# Patient Record
Sex: Female | Born: 1950 | Race: White | Hispanic: No | Marital: Married | Smoking: Current every day smoker
Health system: Southern US, Community
[De-identification: ages and names within clinical notes are randomized; demographics above are authoritative.]

## PROBLEM LIST (undated history)

## (undated) DIAGNOSIS — F419 Anxiety disorder, unspecified: Secondary | ICD-10-CM

## (undated) DIAGNOSIS — I6529 Occlusion and stenosis of unspecified carotid artery: Secondary | ICD-10-CM

## (undated) DIAGNOSIS — M51369 Other intervertebral disc degeneration, lumbar region without mention of lumbar back pain or lower extremity pain: Secondary | ICD-10-CM

## (undated) DIAGNOSIS — M5136 Other intervertebral disc degeneration, lumbar region: Secondary | ICD-10-CM

## (undated) DIAGNOSIS — I219 Acute myocardial infarction, unspecified: Secondary | ICD-10-CM

## (undated) DIAGNOSIS — G459 Transient cerebral ischemic attack, unspecified: Secondary | ICD-10-CM

## (undated) DIAGNOSIS — I2510 Atherosclerotic heart disease of native coronary artery without angina pectoris: Secondary | ICD-10-CM

## (undated) DIAGNOSIS — E7800 Pure hypercholesterolemia, unspecified: Secondary | ICD-10-CM

## (undated) DIAGNOSIS — G8929 Other chronic pain: Secondary | ICD-10-CM

## (undated) DIAGNOSIS — M1990 Unspecified osteoarthritis, unspecified site: Secondary | ICD-10-CM

## (undated) DIAGNOSIS — E8881 Metabolic syndrome: Secondary | ICD-10-CM

## (undated) DIAGNOSIS — I739 Peripheral vascular disease, unspecified: Secondary | ICD-10-CM

## (undated) DIAGNOSIS — I10 Essential (primary) hypertension: Secondary | ICD-10-CM

## (undated) DIAGNOSIS — G2581 Restless legs syndrome: Secondary | ICD-10-CM

## (undated) DIAGNOSIS — F482 Pseudobulbar affect: Secondary | ICD-10-CM

## (undated) DIAGNOSIS — N390 Urinary tract infection, site not specified: Secondary | ICD-10-CM

## (undated) DIAGNOSIS — I639 Cerebral infarction, unspecified: Secondary | ICD-10-CM

## (undated) HISTORY — PX: CARDIAC SURGERY: SHX584

## (undated) HISTORY — DX: Anxiety disorder, unspecified: F41.9

## (undated) HISTORY — DX: Restless legs syndrome: G25.81

## (undated) HISTORY — DX: Urinary tract infection, site not specified: N39.0

## (undated) HISTORY — DX: Occlusion and stenosis of unspecified carotid artery: I65.29

## (undated) HISTORY — DX: Other intervertebral disc degeneration, lumbar region: M51.36

## (undated) HISTORY — DX: Metabolic syndrome: E88.81

## (undated) HISTORY — DX: Pseudobulbar affect: F48.2

## (undated) HISTORY — DX: Other intervertebral disc degeneration, lumbar region without mention of lumbar back pain or lower extremity pain: M51.369

## (undated) HISTORY — DX: Peripheral vascular disease, unspecified: I73.9

## (undated) HISTORY — PX: EYE SURGERY: SHX253

## (undated) HISTORY — DX: Other chronic pain: G89.29

## (undated) HISTORY — PX: CORONARY ARTERY BYPASS GRAFT: SHX141

## (undated) HISTORY — DX: Metabolic syndrome: E88.810

---

## 1995-01-27 HISTORY — PX: PTCA: SHX146

## 2012-10-16 ENCOUNTER — Inpatient Hospital Stay (HOSPITAL_COMMUNITY): Payer: BC Managed Care – PPO

## 2012-10-16 ENCOUNTER — Inpatient Hospital Stay (HOSPITAL_COMMUNITY)
Admission: EM | Admit: 2012-10-16 | Discharge: 2012-10-20 | DRG: 014 | Disposition: A | Discharge status: Rehabilitation Facility | Payer: BC Managed Care – PPO | Attending: Neurology | Admitting: Neurology

## 2012-10-16 ENCOUNTER — Emergency Department (HOSPITAL_COMMUNITY): Payer: BC Managed Care – PPO

## 2012-10-16 ENCOUNTER — Encounter (HOSPITAL_COMMUNITY): Payer: Self-pay

## 2012-10-16 DIAGNOSIS — I10 Essential (primary) hypertension: Secondary | ICD-10-CM | POA: Diagnosis present

## 2012-10-16 DIAGNOSIS — I63239 Cerebral infarction due to unspecified occlusion or stenosis of unspecified carotid arteries: Secondary | ICD-10-CM

## 2012-10-16 DIAGNOSIS — I252 Old myocardial infarction: Secondary | ICD-10-CM

## 2012-10-16 DIAGNOSIS — E785 Hyperlipidemia, unspecified: Secondary | ICD-10-CM | POA: Diagnosis present

## 2012-10-16 DIAGNOSIS — I639 Cerebral infarction, unspecified: Secondary | ICD-10-CM

## 2012-10-16 DIAGNOSIS — F3289 Other specified depressive episodes: Secondary | ICD-10-CM | POA: Diagnosis present

## 2012-10-16 DIAGNOSIS — G8190 Hemiplegia, unspecified affecting unspecified side: Secondary | ICD-10-CM | POA: Diagnosis present

## 2012-10-16 DIAGNOSIS — R4701 Aphasia: Secondary | ICD-10-CM

## 2012-10-16 DIAGNOSIS — R29810 Facial weakness: Secondary | ICD-10-CM | POA: Diagnosis present

## 2012-10-16 DIAGNOSIS — G936 Cerebral edema: Secondary | ICD-10-CM | POA: Diagnosis present

## 2012-10-16 DIAGNOSIS — Z8673 Personal history of transient ischemic attack (TIA), and cerebral infarction without residual deficits: Secondary | ICD-10-CM

## 2012-10-16 DIAGNOSIS — Z7982 Long term (current) use of aspirin: Secondary | ICD-10-CM

## 2012-10-16 DIAGNOSIS — E669 Obesity, unspecified: Secondary | ICD-10-CM | POA: Diagnosis present

## 2012-10-16 DIAGNOSIS — F17200 Nicotine dependence, unspecified, uncomplicated: Secondary | ICD-10-CM | POA: Diagnosis present

## 2012-10-16 DIAGNOSIS — I6350 Cerebral infarction due to unspecified occlusion or stenosis of unspecified cerebral artery: Principal | ICD-10-CM | POA: Diagnosis present

## 2012-10-16 DIAGNOSIS — Z79899 Other long term (current) drug therapy: Secondary | ICD-10-CM

## 2012-10-16 DIAGNOSIS — R471 Dysarthria and anarthria: Secondary | ICD-10-CM | POA: Diagnosis present

## 2012-10-16 DIAGNOSIS — F329 Major depressive disorder, single episode, unspecified: Secondary | ICD-10-CM | POA: Diagnosis present

## 2012-10-16 DIAGNOSIS — I2510 Atherosclerotic heart disease of native coronary artery without angina pectoris: Secondary | ICD-10-CM | POA: Diagnosis present

## 2012-10-16 HISTORY — DX: Transient cerebral ischemic attack, unspecified: G45.9

## 2012-10-16 HISTORY — DX: Pure hypercholesterolemia, unspecified: E78.00

## 2012-10-16 HISTORY — DX: Atherosclerotic heart disease of native coronary artery without angina pectoris: I25.10

## 2012-10-16 HISTORY — DX: Acute myocardial infarction, unspecified: I21.9

## 2012-10-16 HISTORY — DX: Essential (primary) hypertension: I10

## 2012-10-16 LAB — CBC
Hemoglobin: 15.1 g/dL — ABNORMAL HIGH (ref 12.0–15.0)
MCHC: 35.4 g/dL (ref 30.0–36.0)
Platelets: 241 10*3/uL (ref 150–400)
RDW: 13.4 % (ref 11.5–15.5)

## 2012-10-16 LAB — URINALYSIS, ROUTINE W REFLEX MICROSCOPIC
Ketones, ur: NEGATIVE mg/dL
Leukocytes, UA: NEGATIVE
Protein, ur: NEGATIVE mg/dL
Urobilinogen, UA: 0.2 mg/dL (ref 0.0–1.0)

## 2012-10-16 LAB — COMPREHENSIVE METABOLIC PANEL
ALT: 10 U/L (ref 0–35)
AST: 16 U/L (ref 0–37)
Albumin: 3.8 g/dL (ref 3.5–5.2)
Alkaline Phosphatase: 104 U/L (ref 39–117)
Chloride: 101 mEq/L (ref 96–112)
Potassium: 4.3 mEq/L (ref 3.5–5.1)
Sodium: 138 mEq/L (ref 135–145)
Total Bilirubin: 0.3 mg/dL (ref 0.3–1.2)
Total Protein: 7.1 g/dL (ref 6.0–8.3)

## 2012-10-16 LAB — DIFFERENTIAL
Basophils Absolute: 0.1 10*3/uL (ref 0.0–0.1)
Basophils Relative: 1 % (ref 0–1)
Monocytes Relative: 8 % (ref 3–12)
Neutro Abs: 6.6 10*3/uL (ref 1.7–7.7)
Neutrophils Relative %: 56 % (ref 43–77)

## 2012-10-16 LAB — POCT I-STAT, CHEM 8
Calcium, Ion: 1.16 mmol/L (ref 1.13–1.30)
Chloride: 105 mEq/L (ref 96–112)
Creatinine, Ser: 0.9 mg/dL (ref 0.50–1.10)
Glucose, Bld: 115 mg/dL — ABNORMAL HIGH (ref 70–99)
HCT: 46 % (ref 36.0–46.0)
Potassium: 4.2 mEq/L (ref 3.5–5.1)

## 2012-10-16 LAB — RAPID URINE DRUG SCREEN, HOSP PERFORMED
Amphetamines: NOT DETECTED
Benzodiazepines: NOT DETECTED
Tetrahydrocannabinol: NOT DETECTED

## 2012-10-16 LAB — POCT I-STAT TROPONIN I: Troponin i, poc: 0.01 ng/mL (ref 0.00–0.08)

## 2012-10-16 LAB — PROTIME-INR
INR: 0.96 (ref 0.00–1.49)
Prothrombin Time: 12.7 seconds (ref 11.6–15.2)

## 2012-10-16 LAB — APTT: aPTT: 25 seconds (ref 24–37)

## 2012-10-16 LAB — MRSA PCR SCREENING: MRSA by PCR: NEGATIVE

## 2012-10-16 MED ORDER — ALTEPLASE (STROKE) FULL DOSE INFUSION
0.9000 mg/kg | Freq: Once | INTRAVENOUS | Status: AC
  Administered 2012-10-16: 71 mg via INTRAVENOUS
  Filled 2012-10-16: qty 71

## 2012-10-16 MED ORDER — SENNOSIDES-DOCUSATE SODIUM 8.6-50 MG PO TABS
1.0000 | ORAL_TABLET | Freq: Every evening | ORAL | Status: DC | PRN
  Filled 2012-10-16: qty 1

## 2012-10-16 MED ORDER — SODIUM CHLORIDE 0.9 % IV SOLN
INTRAVENOUS | Status: DC
  Administered 2012-10-16 – 2012-10-19 (×3): via INTRAVENOUS

## 2012-10-16 MED ORDER — LABETALOL HCL 5 MG/ML IV SOLN: 10.0000 mg | INTRAVENOUS | Status: DC | PRN

## 2012-10-16 MED ORDER — PANTOPRAZOLE SODIUM 40 MG IV SOLR
40.0000 mg | Freq: Every day | INTRAVENOUS | Status: DC
  Administered 2012-10-16 – 2012-10-19 (×4): 40 mg via INTRAVENOUS
  Filled 2012-10-16 (×6): qty 40

## 2012-10-16 MED ORDER — ONDANSETRON HCL 4 MG/2ML IJ SOLN: 4.0000 mg | Freq: Four times a day (QID) | INTRAMUSCULAR | Status: DC | PRN

## 2012-10-16 NOTE — Progress Notes (Signed)
UR completed 

## 2012-10-16 NOTE — Consult Note (Signed)
Referring Physician: ED    Chief Complaint: code stroke: right hemiparesis, aphasia, right face weakness.  HPI:                                                                                                                                         Tiffany [name] is an 62 y.o. female with a past medical history significant for hypertension, hypercholesterolemia, CAD s/p MI and s/p stenting, TIA last year, brought [facility] by medics as a code stroke due to acute onset right hemiparesis, aphasia, and right face droopiness. Patient's husband stated that Tiffany [name] woke up at 7 am today feeling well, had breakfast, and around 7:50 developed weakness of the right side of her body and couldn't talk. EMS was called and when they arrived to the scene she was not able to follow commands, aphasic with right hemiparesis and right face weakness. Upon arrival to ED she scored 13 in the NIHSS and had an emergent CT brain that showed a subtle area of deceased attenuation in the left insula and left operculum. Patient is alert but aphasic.  Date last known well: 10/16/12  Time last known well: 7:50 am tPA Given: yes  Past Medical History  Diagnosis Date  . Coronary artery disease   . Hypertension   . TIA (transient ischemic attack)   . High cholesterol   . MI (myocardial infarction)     Past Surgical History  Procedure Laterality Date  . Cardiac surgery      No family history on file. Social History:  has no tobacco, alcohol, and drug history on file.  Allergies: Allergies not on file  Medications:                                                                                                                           I have reviewed the patient's current medications.  ROS: unable to obtain due to global aphasia.  History obtained from chart review and  husband.    Physical exam: no apparent distress. Gets emotional. Blood pressure 126/74, pulse 83, resp. rate 18, weight 79.379 kg (175 lb), SpO2 97.00%. Head: normocephalic. Neck: supple, no bruits, no JVD. Cardiac: no murmurs. Lungs: clear. Abdomen: soft, no tender, no mass. Extremities: no edema.  Neurologic Examination:                                                                                                      Mental status: alert and awake but with global aphasia, expressive > receptive. CN 2-12: significant for right face weakness. Motor: right hemiparesis. Sensory: extinction in the right. DTRs: 2+ all over. Plantars: right upgoing toe. Left downgoing. Coordination and gait: unreliable. Gait: can not be tested.    Results for orders placed during the hospital encounter of 10/16/12 (from the past 48 hour(s))  PROTIME-INR     Status: None   Collection Time    10/16/12 10:00 AM      Result Value Range   Prothrombin Time 12.7  11.6 - 15.2 seconds   INR 0.96  0.00 - 1.49  APTT     Status: None   Collection Time    10/16/12 10:00 AM      Result Value Range   aPTT 25  24 - 37 seconds  CBC     Status: Abnormal   Collection Time    10/16/12 10:00 AM      Result Value Range   WBC 11.8 (*) 4.0 - 10.5 K/uL   RBC 4.81  3.87 - 5.11 MIL/uL   Hemoglobin 15.1 (*) 12.0 - 15.0 g/dL   HCT 16.1  09.6 - 04.5 %   MCV 88.6  78.0 - 100.0 fL   MCH 31.4  26.0 - 34.0 pg   MCHC 35.4  30.0 - 36.0 g/dL   RDW 40.9  81.1 - 91.4 %   Platelets 241  150 - 400 K/uL  DIFFERENTIAL     Status: None   Collection Time    10/16/12 10:00 AM      Result Value Range   Neutrophils Relative 56  43 - 77 %   Neutro Abs 6.6  1.7 - 7.7 K/uL   Lymphocytes Relative 33  12 - 46 %   Lymphs Abs 3.9  0.7 - 4.0 K/uL   Monocytes Relative 8  3 - 12 %   Monocytes Absolute 1.0  0.1 - 1.0 K/uL   Eosinophils Relative 3  0 - 5 %   Eosinophils Absolute 0.3  0.0 - 0.7 K/uL   Basophils Relative 1  0 - 1 %    Basophils Absolute 0.1  0.0 - 0.1 K/uL  POCT I-STAT TROPONIN I     Status: None   Collection Time    10/16/12 10:08 AM      Result Value Range   Troponin i, poc 0.01  0.00 - 0.08 ng/mL   Comment 3            Comment: Due to the release kinetics of cTnI,  a negative result within the first hours     of the onset of symptoms does not rule out     myocardial infarction with certainty.     If myocardial infarction is still suspected,     repeat the test at appropriate intervals.  POCT I-STAT, CHEM 8     Status: Abnormal   Collection Time    10/16/12 10:10 AM      Result Value Range   Sodium 139  135 - 145 mEq/L   Potassium 4.2  3.5 - 5.1 mEq/L   Chloride 105  96 - 112 mEq/L   BUN 9  6 - 23 mg/dL   Creatinine, Ser 4.09  0.50 - 1.10 mg/dL   Glucose, Bld 811 (*) 70 - 99 mg/dL   Calcium, Ion 9.14  7.82 - 1.30 mmol/L   TCO2 25  0 - 100 mmol/L   Hemoglobin 15.6 (*) 12.0 - 15.0 g/dL   HCT 95.6  21.3 - 08.6 %   Ct Head Wo Contrast  10/16/2012  *RADIOLOGY REPORT*  Clinical Data:   62 year old female Code stroke.  Expressive aphasia, right side weakness and slurred speech.  CT HEAD WITHOUT CONTRAST  Technique:  Contiguous axial images were obtained from the base of the skull through the vertex without contrast.  Comparison: None.  Findings: Minimal left mastoid air cell fluid.  Other visualized paranasal sinuses and mastoids are clear.  Visualized orbit soft tissues are within normal limits.  No acute osseous abnormality identified.  Negative scalp soft tissues.  No ventriculomegaly. No midline shift, mass effect, or evidence of mass lesion.  Bilateral subcortical white matter hypodensity. There is involvement of the left cerebral white matter near the superior temporal gyrus.  Question subtle hypodensity of the insula.  Otherwise left basal ganglia gray-white matter differentiation remains preserved. No acute intracranial hemorrhage identified.  No suspicious intracranial vascular hyperdensity.   IMPRESSION: Cannot exclude early cytotoxic edema in the insula.  Bilateral subcortical white matter hypodensity noted, including at the left operculum.  No acute intracranial hemorrhage.  No suspicious intracranial vascular hyperdensity.  Critical Value/emergent results were called by telephone at the time of interpretation on 10/16/2012 at 1014 hours to Dr. Leroy Kennedy, who verbally acknowledged these results.   Original Report Authenticated By: Erskine Speed, M.D.       Assessment: 62 y.o. female with multiple risk factors for stroke and acute left MCA distribution infarct, with NIHSS 13. Patient meets criteria for IV tpa and thus we proceed with this treatment. Admit to NICU and follow post IV tpa protocol. Stroke team to resume care in the morning.  Stroke Risk Factors -hypertension, hypercholesterolemia, CAD s/p MI and s/p stenting, TIA   Plan: 1. HgbA1c, fasting lipid panel 2. MRI, MRA  of the brain without contrast 3. PT consult, OT consult, Speech consult 4. Echocardiogram 5. Carotid dopplers 6. Prophylactic therapy- as per post IV tpa protocol. 7. Risk factor modification 8. Telemetry monitoring 9. Frequent neuro checks   Wyatt Portela ,MD [facility] [phone]  10/16/2012, 10:47 AM

## 2012-10-16 NOTE — Progress Notes (Signed)
Carotid doppler results reported by Candace. Left ICA occluded and <80% right ICA stenosis. Reported results to Annie Main, NP. No new orders received. Will continue to monitor.

## 2012-10-16 NOTE — Progress Notes (Signed)
  Echocardiogram 2D Echocardiogram has been performed.  Tiffany [name] FRANCES 10/16/2012, 5:38 PM 

## 2012-10-16 NOTE — Progress Notes (Addendum)
VASCULAR LAB PRELIMINARY  PRELIMINARY  PRELIMINARY  PRELIMINARY  Carotid Dopplers completed.    Preliminary report:  There is <80% right ICA stenosis.  The left ICA appears occluded.  Zeniah Briney, RVT 10/16/2012, 2:24 PM

## 2012-10-16 NOTE — Progress Notes (Addendum)
Called by NICU nurse regarding patient's changes in mental status. She improved to NIHSS 9 after iv tpa but now back to NIHSS 11. She is not having other medical issues to account for this change, thus I asked for a STAT CT brain to excude the possibility of post tpa hemorrhage. Will follow on the results of CT.

## 2012-10-16 NOTE — Evaluation (Signed)
Clinical/Bedside Swallow Evaluation Patient Details  Name: Tiffany [name] MRN: [id number] Date of Birth: [birth year]  Today's Date: 10/16/2012 Time: 4098-1191 SLP Time Calculation (min): 17 min  Past Medical History:  Past Medical History  Diagnosis Date  . Coronary artery disease   . Hypertension   . TIA (transient ischemic attack)   . High cholesterol   . MI (myocardial infarction)    Past Surgical History:  Past Surgical History  Procedure Laterality Date  . Cardiac surgery     HPI:  Tiffany [name] is an 62 y.o. female with a past medical history significant for hypertension, hypercholesterolemia, CAD s/p MI and s/p stenting, TIA last year, brought [facility] by medics as a code stroke due to acute onset right hemiparesis, aphasia, and right face droopiness.  Pt with left mca distribution infarct, given tPA. Pt failed stroke swallow screen.    Assessment / Plan / Recommendation Clinical Impression  Pt demonstrated a mild oral dysphagia with trace unsensed residuals in right buccal cavity due to mild right numbness which she cleared with min verbal cues for lingual sweep. Pt struggles to describe symptoms but via Y/N confirmed numbness aroung right face/mouth. Through ROM adequate, there is a slight droop noticable at rest. There was no evidence of aspiration with thin liquids. Swallow response timely and strong with consecutive sips. Recommend pt initate a dys 3 (mechanical soft) diet with thin liquids. SLP will f/u tomorrow for cognitive linguistic eval and check of tolerance.     Aspiration Risk  Mild    Diet Recommendation Dysphagia 3 (Mechanical Soft);Thin liquid   Liquid Administration via: Cup;Straw Medication Administration: Whole meds with liquid Supervision: Patient able to self feed (assist with set up) Compensations: Slow rate;Small sips/bites Postural Changes and/or Swallow Maneuvers: Seated upright 90 degrees    Other  Recommendations Oral Care Recommendations:  Oral care BID   Follow Up Recommendations  Other (comment) (TBD at time of cognitive linguistic eval)    Frequency and Duration min 1 x/week  1 week   Pertinent Vitals/Pain NA    SLP Swallow Goals Patient will consume recommended diet without observed clinical signs of aspiration with: Supervision/safety Swallow Study Goal #1 - Progress: Progressing toward goal Patient will utilize recommended strategies during swallow to increase swallowing safety with: Supervision/safety Swallow Study Goal #2 - Progress: Progressing toward goal   Swallow Study Prior Functional Status       General HPI: Tiffany [name] is an 62 y.o. female with a past medical history significant for hypertension, hypercholesterolemia, CAD s/p MI and s/p stenting, TIA last year, [facility] by medics as a code stroke due to acute onset right hemiparesis, aphasia, and right face droopiness.  Pt with left mca distribution infarct, given tPA. Pt failed stroke swallow screen.  Type of Study: Bedside swallow evaluation Previous Swallow Assessment: none Diet Prior to this Study: NPO Temperature Spikes Noted: No Respiratory Status: Room air History of Recent Intubation: No Behavior/Cognition: Alert;Cooperative;Pleasant mood Oral Cavity - Dentition: Adequate natural dentition Self-Feeding Abilities: Able to feed self;Needs assist Patient Positioning: Upright in bed Baseline Vocal Quality: Clear Volitional Cough: Strong Volitional Swallow: Able to elicit    Oral/Motor/Sensory Function Overall Oral Motor/Sensory Function: Impaired Labial ROM: Within Functional Limits Labial Symmetry: Abnormal symmetry right Labial Strength: Within Functional Limits Labial Sensation: Reduced Lingual ROM: Within Functional Limits Lingual Symmetry: Within Functional Limits Lingual Strength: Within Functional Limits Lingual Sensation: Within Functional Limits Facial ROM: Within Functional Limits Facial Symmetry: Within  Functional Limits Facial Strength:  Within Functional Limits Facial Sensation: Within Functional Limits Velum: Within Functional Limits Mandible: Within Functional Limits   Ice Chips     Thin Liquid Thin Liquid: Within functional limits Presentation: Cup;Straw;Self Fed    Nectar Thick Nectar Thick Liquid: Not tested   Honey Thick Honey Thick Liquid: Not tested   Puree Puree: Impaired Presentation: Self Fed;Spoon Oral Phase Functional Implications: Oral residue (trace right buccal residual)   Solid   GO    Solid: Impaired Presentation: Self Fed Oral Phase Functional Implications: Oral residue (trace right buccal residue)      Harlon Ditty, MA CCC-SLP [phone]  Claudine Mouton 10/16/2012,4:12 PM

## 2012-10-16 NOTE — Progress Notes (Signed)
Utilization review completed.  P.J. Cellie Dardis,RN,BSN Case Manager [phone]  

## 2012-10-16 NOTE — Code Documentation (Signed)
61 year old female who according to her husband woke around 7 am this morning in her normal state of health.  She drank a cup of coffee and was at her normal baseline.  At 0750 he stepped out the feed the dogs.  When he returned at 0800 she was having difficulty speaking and right sided weakness.  EMS was called and they called the code stroke from the field at [phone] with ETA of 30 mins.  Patient arrived to the ED at 0951 EDP exam at 0953 to CT scan at 0957 Stroke team arrival at [phone].  NIHSS 13. (see flowsheet).  Dr. Cyril Mourning present - husband present.  Pharmacy delivered tPA at 1032.  Dr. Cyril Mourning discussing with patient and family.  Tpa started at 1038.  NS bolus going BP 110-120/systolic.  1100 - right arm improving - now able to resist gravity.  1125 - Report being called to 3100 RN Baird Lyons.  Right arm again  weaker with 1130 check - documented and reported to [facility] at bedside with report.  Patient transported to 3113 without incident. Handoff to [employer].

## 2012-10-16 NOTE — ED Notes (Signed)
Code Stroke encoded at 0932.  Code Stroke called at 0933.  Phlebotomy arrival 0945.  Pt arrival [phone].  EDP exam [phone].  Pt arrival to CT 0957.  Stroke team arrival [phone].  Pharmacy notified to mix tPA.  Pt LSN 0800.

## 2012-10-16 NOTE — ED Provider Notes (Signed)
History     CSN: [id number]  Arrival date & time 10/16/12  1027   First MD Initiated Contact with Patient 10/16/12 1007      Chief Complaint  Patient presents with  . Code Stroke    (Consider location/radiation/quality/duration/timing/severity/associated sxs/prior treatment) HPI Pt arrives as code stroke. Last seen normal 0800. Found by husband to be aphasic, R-sided weakness, R facial droop. Pt non-verbal but denies pain at this time. Level 5 caveat. Stroke team in room.  Past Medical History  Diagnosis Date  . Coronary artery disease   . Hypertension   . TIA (transient ischemic attack)   . High cholesterol   . MI (myocardial infarction)     Past Surgical History  Procedure Laterality Date  . Cardiac surgery      No family history on file.  History  Substance Use Topics  . Smoking status: Not on file  . Smokeless tobacco: Not on file  . Alcohol Use: Not on file    OB History   Grav Para Term Preterm Abortions TAB SAB Ect Mult Living                  Review of Systems  Unable to perform ROS: Mental status change  Respiratory: Negative for shortness of breath.   Cardiovascular: Negative for chest pain.  Gastrointestinal: Negative for abdominal pain.    Allergies  Review of patient's allergies indicates no known allergies.  Home Medications  No current outpatient prescriptions on file.  BP 124/69  Pulse 85  Resp 18  Wt 175 lb (79.379 kg)  SpO2 97%  Physical Exam  Nursing note and vitals reviewed. Constitutional: She appears well-developed and well-nourished. No distress.  HENT:  Head: Normocephalic and atraumatic.  Mouth/Throat: Oropharynx is clear and moist.  R facial droop  Eyes: EOM are normal. Pupils are equal, round, and reactive to light.  Neck: Normal range of motion. Neck supple.  Cardiovascular: Normal rate and regular rhythm.   Pulmonary/Chest: Effort normal and breath sounds normal. No respiratory distress. She has no wheezes. She  has no rales. She exhibits no tenderness.  Abdominal: Soft. Bowel sounds are normal. She exhibits no distension and no mass. There is no tenderness. There is no rebound and no guarding.  Musculoskeletal: Normal range of motion. She exhibits no edema and no tenderness.  Neurological: She is alert.  3/5 RLE, 4/5 RUE decreased sensation to light touch on right. Make unintelligible sounds. Follows commands. 5/5 motor LUE/LLE.   Skin: Skin is warm and dry. No rash noted. No erythema.  Psychiatric: She has a normal mood and affect. Her behavior is normal.    ED Course  Procedures (including critical care time)  Labs Reviewed  CBC - Abnormal; Notable for the following:    WBC 11.8 (*)    Hemoglobin 15.1 (*)    All other components within normal limits  POCT I-STAT, CHEM 8 - Abnormal; Notable for the following:    Glucose, Bld 115 (*)    Hemoglobin 15.6 (*)    All other components within normal limits  PROTIME-INR  APTT  DIFFERENTIAL  TROPONIN I  ETHANOL  COMPREHENSIVE METABOLIC PANEL  URINE RAPID DRUG SCREEN (HOSP PERFORMED)  URINALYSIS, ROUTINE W REFLEX MICROSCOPIC  POCT I-STAT TROPONIN I   Ct Head Wo Contrast  10/16/2012  *RADIOLOGY REPORT*  Clinical Data:   62 year old female Code stroke.  Expressive aphasia, right side weakness and slurred speech.  CT HEAD WITHOUT CONTRAST  Technique:  Contiguous axial  images were obtained from the base of the skull through the vertex without contrast.  Comparison: None.  Findings: Minimal left mastoid air cell fluid.  Other visualized paranasal sinuses and mastoids are clear.  Visualized orbit soft tissues are within normal limits.  No acute osseous abnormality identified.  Negative scalp soft tissues.  No ventriculomegaly. No midline shift, mass effect, or evidence of mass lesion.  Bilateral subcortical white matter hypodensity. There is involvement of the left cerebral white matter near the superior temporal gyrus.  Question subtle hypodensity of the  insula.  Otherwise left basal ganglia gray-white matter differentiation remains preserved. No acute intracranial hemorrhage identified.  No suspicious intracranial vascular hyperdensity.  IMPRESSION: Cannot exclude early cytotoxic edema in the insula.  Bilateral subcortical white matter hypodensity noted, including at the left operculum.  No acute intracranial hemorrhage.  No suspicious intracranial vascular hyperdensity.  Critical Value/emergent results were called by telephone at the time of interpretation on 10/16/2012 at 1014 hours to Dr. Leroy Kennedy, who verbally acknowledged these results.   Original Report Authenticated By: Erskine Speed, M.D.      No diagnosis found.   Date: 10/16/2012  Rate: 83  Rhythm: normal sinus rhythm  QRS Axis: normal  Intervals: normal  ST/T Wave abnormalities: normal  Conduction Disutrbances:none  Narrative Interpretation:   Old EKG Reviewed: none available    MDM  Stroke team decided to give tPA. Will admit.         Loren Racer, MD 10/16/12 1102

## 2012-10-16 NOTE — ED Notes (Addendum)
Pt from home with husband.  Pt ate breakfast at 0800 with no problems.  Directly after eating, pt developed R side weakness, R facial droop, slurred speech, expressive aphasia.  [facility] reports that pt was able to stand on scene, but was dragging R leg.  Pt with recent history of multiple TIA's.  Pt also with history of CAD and MI with stents placed.

## 2012-10-17 ENCOUNTER — Inpatient Hospital Stay (HOSPITAL_COMMUNITY): Payer: BC Managed Care – PPO

## 2012-10-17 LAB — LIPID PANEL
HDL: 36 mg/dL — ABNORMAL LOW (ref >39)
LDL Cholesterol: 108 mg/dL — ABNORMAL HIGH (ref 0–99)
Total CHOL/HDL Ratio: 4.7 RATIO
Triglycerides: 132 mg/dL (ref <150)
VLDL: 26 mg/dL (ref 0–40)

## 2012-10-17 MED ORDER — ENOXAPARIN SODIUM 40 MG/0.4ML ~~LOC~~ SOLN
40.0000 mg | SUBCUTANEOUS | Status: DC
  Administered 2012-10-17 – 2012-10-20 (×4): 40 mg via SUBCUTANEOUS
  Filled 2012-10-17 (×4): qty 0.4

## 2012-10-17 MED ORDER — LORAZEPAM 2 MG/ML IJ SOLN
1.0000 mg | Freq: Once | INTRAMUSCULAR | Status: AC
  Administered 2012-10-17: 1 mg via INTRAVENOUS

## 2012-10-17 MED ORDER — ACETAMINOPHEN 325 MG PO TABS
650.0000 mg | ORAL_TABLET | Freq: Four times a day (QID) | ORAL | Status: DC | PRN
  Administered 2012-10-18 – 2012-10-20 (×2): 650 mg via ORAL
  Filled 2012-10-17 (×2): qty 2

## 2012-10-17 MED ORDER — ASPIRIN 300 MG RE SUPP
300.0000 mg | Freq: Every day | RECTAL | Status: DC
  Administered 2012-10-18: 300 mg via RECTAL
  Filled 2012-10-17 (×4): qty 1

## 2012-10-17 MED ORDER — BIOTENE DRY MOUTH MT LIQD
15.0000 mL | Freq: Two times a day (BID) | OROMUCOSAL | Status: DC
Start: 2012-10-17 — End: 2012-10-20
  Administered 2012-10-18 – 2012-10-20 (×4): 15 mL via OROMUCOSAL

## 2012-10-17 MED ORDER — ASPIRIN 325 MG PO TABS
325.0000 mg | ORAL_TABLET | Freq: Every day | ORAL | Status: DC
  Administered 2012-10-17 – 2012-10-20 (×3): 325 mg via ORAL
  Filled 2012-10-17 (×4): qty 1

## 2012-10-17 NOTE — Progress Notes (Signed)
OT / PT Cancellation Note  Patient Details Name: Tyrell Brereton MRN: [id number] DOB: 11-10-1950   Cancelled Treatment:    Reason Eval/Treat Not Completed: Patient not medically ready (strict bed rest/ stat CT at 12:15 AM today)  Lucile Shutters Pager: [phone]  10/17/2012, 8:28 AM

## 2012-10-17 NOTE — Evaluation (Signed)
Physical Therapy Evaluation Patient Details Name: Tiffany [name] MRN: [id number] DOB: 1950/11/06 Today's Date: 10/17/2012 Time: 4098-1191 PT Time Calculation (min): 28 min  PT Assessment / Plan / Recommendation Clinical Impression  Patient is a 62 y/o female admitted with right sided weakness, and aphasia s/p TPA administration with improvement in symptoms, but recurred overnight.  She presents with decreased independence with mobility due to right side weakness, impaired body spatial awareness, poor motor planning and decreased activity tolerance and decreased balance.  She will benefit from skilled PT in the acute setting to maximize mobility and allow return home with spouse assist following inpatient rehab stay.    PT Assessment  Patient needs continued PT services    Follow Up Recommendations  CIR    Does the patient have the potential to tolerate intense rehabilitation    yes  Barriers to Discharge  none      Equipment Recommendations  Other (comment) (to be assessed further )    Recommendations for Other Services Rehab consult   Frequency Min 4X/week    Precautions / Restrictions Precautions Precautions: Fall Precaution Comments: right sided weakness/pushes right   Pertinent Vitals/Pain No pain complaints      Mobility  Bed Mobility Bed Mobility: Supine to Sit;Sitting - Scoot to Edge of Bed;Sit to Supine Supine to Sit: 2: Max assist;HOB elevated Sitting - Scoot to [place] of Bed: 2: Max assist Sit to Supine: 2: Max assist;HOB elevated Details for Bed Mobility Assistance: Pt needed (A) to squence task and prevent pushing with Lt UE. Pt poor awareness of Rt UE location Transfers Transfers: Stand Pivot Transfers Sit to Stand: 1: +2 Total assist;With upper extremity assist;From bed Sit to Stand: Patient Percentage: 70% Stand to Sit: 1: +2 Total assist;With upper extremity assist;To chair/3-in-1 Stand to Sit: Patient Percentage: 70% Stand Pivot Transfers: 1: +2 Total  assist Stand Pivot Transfers: Patient Percentage: 40% Details for Transfer Assistance: increased assist needed for pivot transfer partially due to right leg buckling after standing to allow change of diaper and due to decreased cognition and question apraxia with moving bed to chair        PT Diagnosis: Abnormality of gait;Hemiplegia dominant side  PT Problem List: Decreased activity tolerance;Decreased balance;Decreased strength;Decreased cognition;Decreased mobility;Decreased coordination;Impaired sensation;Decreased safety awareness PT Treatment Interventions: DME instruction;Gait training;Neuromuscular re-education;Balance training;Functional mobility training;Patient/family education;Therapeutic activities;Therapeutic exercise   PT Goals Acute Rehab PT Goals PT Goal Formulation: Patient unable to participate in goal setting Time For Goal Achievement: 10/31/12 Potential to Achieve Goals: Good Pt will Roll Supine to Right Side: with min assist;with rail PT Goal: Rolling Supine to Right Side - Progress: Goal set today Pt will go Supine/Side to Sit: with min assist PT Goal: Supine/Side to Sit - Progress: Goal set today Pt will Sit at Edge of Bed: with modified independence;3-5 min;with unilateral upper extremity support PT Goal: Sit at Edge Of Bed - Progress: Goal set today Pt will go Sit to Supine/Side: with min assist PT Goal: Sit to Supine/Side - Progress: Goal set today Pt will go Sit to Stand: with min assist PT Goal: Sit to Stand - Progress: Goal set today Pt will go Stand to Sit: with min assist PT Goal: Stand to Sit - Progress: Goal set today Pt will Transfer Bed to Chair/Chair to Bed: with mod assist PT Transfer Goal: Bed to Chair/Chair to Bed - Progress: Goal set today Pt will Stand: with min assist;1 - 2 min;with unilateral upper extremity support PT Goal: Stand - Progress: Goal set  today Pt will Ambulate: 16 - 50 feet;with mod assist;with least restrictive assistive  device PT Goal: Ambulate - Progress: Goal set today  Visit Information  Last PT Received On: 10/17/12 Assistance Needed: +2 PT/OT Co-Evaluation/Treatment: Yes    Subjective Data  Subjective: "Bye" Patient Stated Goal: Unable to state   Prior Functioning  Home Living Lives With:  (unknown at this time due to expressive aphasia) Additional Comments: Pt responds to yes not questions 6 out 7 correctly Communication Communication: Expressive difficulties Dominant Hand: Right    Cognition  Cognition Arousal/Alertness: Awake/alert Behavior During Therapy: WFL for tasks assessed/performed Overall Cognitive Status: Impaired/Different from baseline Difficult to assess due to: Impaired communication    Extremity/Trunk Assessment Right Upper Extremity Assessment RUE ROM/Strength/Tone: Deficits;Due to impaired cognition RUE ROM/Strength/Tone Deficits: pt with tone in Right UE and no active movement noted at this time RUE Sensation: Deficits RUE Coordination: Deficits RUE Coordination Deficits: abscent gross grasp at this time Brunstrom I Left Upper Extremity Assessment LUE ROM/Strength/Tone: Within functional levels LUE Sensation: WFL - Light Touch LUE Coordination: WFL - gross/fine motor Right Lower Extremity Assessment RLE ROM/Strength/Tone: Deficits;Unable to fully assess;Due to impaired cognition RLE ROM/Strength/Tone Deficits: lifts antigravity, AROM WFL, strength grossly 3+/5 Left Lower Extremity Assessment LLE ROM/Strength/Tone: WFL for tasks assessed Trunk Assessment Trunk Assessment: Normal   Balance Balance Balance Assessed: Yes Static Standing Balance Static Standing - Balance Support: During functional activity Static Standing - Level of Assistance: 2: Max assist Static Standing - Comment/# of Minutes: stood to allow changing soiled brief; demonstrates pushing to right and right knee hyperextension with legs braced against bed behind her; total time standing about 50  seconds  End of Session PT - End of Session Equipment Utilized During Treatment: Gait belt Activity Tolerance: Patient limited by fatigue Patient left: in chair;with chair alarm set;with call bell/phone within reach Nurse Communication: Mobility status  GP     [facility] 10/17/2012, 4:54 PM Sheran Lawless, PT [phone] 10/17/2012

## 2012-10-17 NOTE — Progress Notes (Signed)
Dr Thad Ranger called about pt no longer following commands or moving right upper or lower extremities. Stat CT ordered. Will continue to monitor pt.  Cyndie Chime [place]

## 2012-10-17 NOTE — Progress Notes (Signed)
Stroke Team Progress Note  HISTORY Tiffany [name] is an 62 y.o. female with a past medical history significant for hypertension, hypercholesterolemia, CAD s/p MI and s/p stenting, TIA last year, brought to [facility] by medics as a code stroke due to acute onset right hemiparesis, aphasia, and right face droopiness. Patient's husband stated that Tiffany [name] woke up at 7 am 10/16/2012 feeling well, had breakfast, and around 7:50 developed weakness of the right side of her body and couldn't talk. EMS was called and when they arrived to the scene she was not able to follow commands, aphasic with right hemiparesis and right face weakness. Upon arrival to ED she scored 13 in the NIHSS and had an emergent CT brain that showed a subtle area of deceased attenuation in the left insula and left operculum. Patient is alert but aphasic. Patient was a TPA candidate. She was admitted to the neuro ICU for further evaluation and treatment.  SUBJECTIVE No family is at the bedside.  Per RN:  Stopped talking at and had decreased movement of her right side. CT performed at that time.  OBJECTIVE Most recent Vital Signs: Filed Vitals:   10/17/12 0500 10/17/12 0600 10/17/12 0700 10/17/12 0800  BP: 112/57 101/52 102/61 112/60  Pulse: 84 89 81 87  Temp:      TempSrc:      Resp: 18 18 18 20   Height:      Weight:      SpO2: 92% 93% 98% 97%   CBG (last 3)   Recent Labs  10/16/12 1604  GLUCAP 101*    IV Fluid Intake:   . sodium chloride 75 mL/hr at 10/17/12 0700    MEDICATIONS  . pantoprazole (PROTONIX) IV  40 mg Intravenous QHS   PRN:  labetalol, ondansetron (ZOFRAN) IV, senna-docusate  Diet:  Dysphagia 3 thin liquids Activity:  Bedrest DVT Prophylaxis:  SCDs   CLINICALLY SIGNIFICANT STUDIES Basic Metabolic Panel:  Recent Labs Lab 10/16/12 1000 10/16/12 1010  NA 138 139  K 4.3 4.2  CL 101 105  CO2 25  --   GLUCOSE 116* 115*  BUN 10 9  CREATININE 0.83 0.90  CALCIUM 9.7  --    Liver  Function Tests:  Recent Labs Lab 10/16/12 1000  AST 16  ALT 10  ALKPHOS 104  BILITOT 0.3  PROT 7.1  ALBUMIN 3.8   CBC:  Recent Labs Lab 10/16/12 1000 10/16/12 1010  WBC 11.8*  --   NEUTROABS 6.6  --   HGB 15.1* 15.6*  HCT 42.6 46.0  MCV 88.6  --   PLT 241  --    Coagulation:  Recent Labs Lab 10/16/12 1000  LABPROT 12.7  INR 0.96   Cardiac Enzymes:  Recent Labs Lab 10/16/12 1000  TROPONINI <0.30   Urinalysis:  Recent Labs Lab 10/16/12 1519  COLORURINE YELLOW  LABSPEC 1.013  PHURINE 5.5  GLUCOSEU NEGATIVE  HGBUR NEGATIVE  BILIRUBINUR NEGATIVE  KETONESUR NEGATIVE  PROTEINUR NEGATIVE  UROBILINOGEN 0.2  NITRITE NEGATIVE  LEUKOCYTESUR NEGATIVE   Lipid Panel No results found for this basename: chol, trig, hdl, cholhdl, vldl, ldlcalc   HgbA1C  No results found for this basename: HGBA1C   Urine Drug Screen:     Component Value Date/Time   LABOPIA NONE DETECTED 10/16/2012 1519   COCAINSCRNUR NONE DETECTED 10/16/2012 1519   LABBENZ NONE DETECTED 10/16/2012 1519   AMPHETMU NONE DETECTED 10/16/2012 1519   THCU NONE DETECTED 10/16/2012 1519   LABBARB NONE DETECTED 10/16/2012 1519  Alcohol Level:  Recent Labs Lab 10/16/12 1000  ETH <11   CT of the brain   10/17/2012   Developing focus of low attenuation change with indistinct gray- white junctions in the left insular cortex and anterior deep white matter are consistent with early changes of acute infarct.  No acute intracranial hemorrhage.    10/16/2012  No acute abnormality.    10/16/2012   Cannot exclude early cytotoxic edema in the insula.  Bilateral subcortical white matter hypodensity noted, including at the left operculum.  No acute intracranial hemorrhage.  No suspicious intracranial vascular hyperdensity.    MRI of the brain    MRA of the brain    2D Echocardiogram    Carotid Doppler  Left ICA occluded and <80% right ICA stenosis.  CXR  10/16/2012  Slight bronchitic changes.    EKG  normal  sinus rhythm.   Therapy Recommendations   Physical Exam  Pleasant elderly caucasian lady not in distress.Awake alert. Afebrile. Head is nontraumatic. Neck is supple without bruit.  Cardiac exam no murmur or gallop. Lungs are clear to auscultation. Distal pulses are well felt. Neurological Exam : Awake and alert significant expressive aphasia and speaks only a few words with dysarthria. Unable to speak sentences, name, repeat a right. Can follow simple midline commands only. Left gaze preference but can look to the right past midline. Does not blink to threat on the right but does so on the left. Severe right lower facial weakness. Tongue is midline. Cough and gag a week. Dense right upper extremity plegia with 0/4 strength with minimum withdrawal to painful stimuli. Mild right lower extremity drift with 4/5 strength. Right plantar is upgoing. There appears diminished sensation on the right. Normal strength on the left.  NIHstroke scale 14 ASSESSMENT Tiffany [name] is a 62 y.o. female presenting with acute onset right hemiparesis, aphasia, and right face droopiness. Patient status post IV t-PA 10/16/2012 at 1038. Initial maging suggests a left insular & frontal cortex infarct. MRI pending. Mild neuro worsening overnight with NIHSS increased 2 pts; CT unrevealing for acute abnormality. Source of infarct unknown at this time.  No antiplatelets documented prior to admission. Now on no antiplatelets as within 24h of tpa for secondary stroke prevention. Patient with resultant expressive and receptive aphasia, right hemiparesis (face, arm and leg), right field cut. Work up underway.  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   Hospital day # 1  TREATMENT/PLAN  Add aspirin for secondary stroke prevention after 24h post tpa as CT imaging negative for  hemorrhage.  OOB. Therapy evals  Cancel 24h CT (CT during the night without hemorrhage)  MRI, MRA today  NPO. ST reassess swallow. Diet per ST  Rehab consult  lovenox for VTE prophylaxis  F/u 2D, HgbA1c, lipids  SHARON BIBY, MSN, RN, ANVP-BC, ANP-BC, GNP-BC [facility] Pager: [phone] 10/17/2012 8:24 AM This patient is critically ill and at significant risk of neurological worsening, death and care requires constant monitoring of vital signs, hemodynamics,respiratory and cardiac monitoring,review of multiple databases, neurological assessment, discussion with family, other specialists and medical decision making of high complexity. I spent 30 minutes of neurocritical care time  in the care of  this patient. I have personally obtained a history, examined the patient, evaluated imaging results, and formulated the assessment and plan  of care. I agree with the above. Antony Contras, MD

## 2012-10-17 NOTE — Progress Notes (Signed)
Speech Language Pathology Dysphagia Treatment Patient Details Name: Tiffany [name] MRN: [id number] DOB: [birth year] Today's Date: 10/17/2012 Time: 4098-1191 SLP Time Calculation (min): 37 min  Assessment / Plan / Recommendation Clinical Impression  Pt with a change in status since initial eval, now with increased right focal and limb weakness. Pt now with overt evidence of aspiration with thin liquids and moverate oral dysphagia with liquids and solids with right pocketing and laborious anterior to posterior transit observed. Suspect sensory deficits as well. Recommend pt remain NPO though necessary meds may be given in puree. Will complete MBS tomorrow to determine best diet.     Diet Recommendation  Continue with Current Diet: NPO (except meds)    SLP Plan     Pertinent Vitals/Pain NA   Swallowing Goals  SLP Swallowing Goals Patient will consume recommended diet without observed clinical signs of aspiration with: Supervision/safety Swallow Study Goal #1 - Progress: Discontinued (comment) (change in function) Patient will utilize recommended strategies during swallow to increase swallowing safety with: Supervision/safety Swallow Study Goal #2 - Progress: Discontinued (comment)  General    Oral Cavity - Oral Hygiene Patient is HIGH RISK - Oral Care Protocol followed (see row info): Yes   Dysphagia Treatment Treatment focused on: Upgraded PO texture trials;Skilled observation of diet tolerance;Facilitation of oral phase Treatment Methods/Modalities: Skilled observation Patient observed directly with PO's: Yes Type of PO's observed: Dysphagia 1 (puree);Thin liquids Feeding: Needs assist Liquids provided via: Cup Oral Phase Signs & Symptoms: Prolonged oral phase;Prolonged bolus formation;Right pocketing;Anterior loss/spillage;Prolonged mastication Pharyngeal Phase Signs & Symptoms: Suspected delayed swallow initiation;Multiple swallows;Immediate cough Type of cueing:  Verbal;Tactile;Visual Amount of cueing: Moderate   GO    Harlon Ditty, [place] CCC-SLP [phone]  Claudine Mouton 10/17/2012, 4:15 PM

## 2012-10-17 NOTE — Progress Notes (Signed)
MRI reviewed by Dr. Pearlean Brownie. Per him, patient stable for transfer to the floor. Stroke appears embolic. TEE recommended.   Annie Main, MSN, RN, ANVP-BC, ANP-BC, GNP-BC [facility] Pager: [phone] 10/17/2012 3:53 PM

## 2012-10-17 NOTE — Evaluation (Signed)
Speech Language Pathology Evaluation Patient Details Name: Tiffany [name] MRN: [id number] DOB: Jan 13, 1951 Today's Date: 10/17/2012 Time: 4098-1191 SLP Time Calculation (min): 37 min  Problem List: There are no active problems to display for this patient.  Past Medical History:  Past Medical History  Diagnosis Date  . Coronary artery disease   . Hypertension   . TIA (transient ischemic attack)   . High cholesterol   . MI (myocardial infarction)    Past Surgical History:  Past Surgical History  Procedure Laterality Date  . Cardiac surgery     HPI:  Tiffany [name] is an 62 y.o. female with a past medical history significant for hypertension, hypercholesterolemia, CAD s/p MI and s/p stenting, TIA last year, brought top [facility] by medics as a code stroke due to acute onset right hemiparesis, aphasia, and right face droopiness.  Pt with left mca distribution infarct, given tPA. Neurological worsening after first nights admission.    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 Pt with effortful attempts to initiate language, much improved with spontaneous anutomatic speech and with melodic intonation techniques. Pt is able to nod head y/n with 90% accuracy with basic biographical/environmental questions. Otherwise she struggle to follow one step commands with 50% accuracy and cannot identify objects in a field of two. There is also a a right neglect difficutly problem solving with basic fucntional tasks due to selective attention deficits and new physical deficits. Pt will benefit from continued SLP therapy to address functional communication. Pt would benefit from CIR consult.     SLP Assessment  Patient needs continued Speech Lanaguage Pathology Services    Follow Up Recommendations  Inpatient Rehab    Frequency and Duration min 2x/week  2 weeks   Pertinent  Vitals/Pain NA   SLP Goals  SLP Goals Potential to Achieve Goals: Good SLP Goal #1: Pt will verbalize in automatic speech tasks with max verbal cues x3 in session SLP Goal #2: Pt will express wants/needs with gestures/verbalizations with max contextual and verbal cues x3 in session SLP Goal #3: Pt will follow one step commands with 80% accuracy and moderate multimodal cues.   SLP Evaluation Prior Functioning  Cognitive/Linguistic Baseline: Information not available Lives With:  (unknown at this time due to expressive aphasia)   Cognition  Overall Cognitive Status: Impaired/Different from baseline Arousal/Alertness: Awake/alert Orientation Level: Oriented to person;Oriented to place Attention: Focused;Sustained;Selective Focused Attention: Appears intact Sustained Attention: Appears intact Selective Attention: Impaired Selective Attention Impairment: Functional basic Memory:  (UTA) Awareness: Impaired Awareness Impairment: Intellectual impairment;Emergent impairment Problem Solving: Impaired Problem Solving Impairment: Functional basic Executive Function: Initiating Initiating: Impaired Initiating Impairment: Verbal basic;Functional basic Behaviors: Restless    Comprehension  Auditory Comprehension Overall Auditory Comprehension: Impaired Yes/No Questions: Impaired Basic Biographical Questions: 76-100% accurate Basic Immediate Environment Questions: 50-74% accurate Commands: Impaired One Step Basic Commands: 25-49% accurate Interfering Components: Attention;Visual impairments EffectiveTechniques: Visual/Gestural cues;Repetition;Pausing;Extra processing time Visual Recognition/Discrimination Discrimination: Exceptions to [facility] Common Objects: Unable to indentify Reading Comprehension Reading Status: Impaired Word level: Impaired (50% accuracy, able to read word "mom") Sentence Level: Not tested    Expression Verbal Expression Overall Verbal Expression:  Impaired Initiation: Impaired Automatic Speech: Name;Counting (with max verbal and visual cues, repetition, melodic intonat) Level of Generative/Spontaneous Verbalization: Word Repetition: Impaired Level of Impairment: Word level Naming: Impairment Confrontation: Impaired Common Objects: Unable to indentify Pragmatics: No impairment Interfering Components: Attention;Speech intelligibility Effective Techniques: Phonemic cues;Melodic intonation;Articulatory cues  Non-Verbal Means of Communication: Gestures (head nod/shake) Written Expression Dominant Hand: Right   Oral / Motor Oral Motor/Sensory Function Overall Oral Motor/Sensory Function: Impaired Labial ROM: Reduced right Labial Symmetry: Abnormal symmetry right Labial Strength: Reduced Labial Sensation: Reduced Lingual ROM: Reduced right Lingual Symmetry: Abnormal symmetry right Lingual Strength: Reduced Lingual Sensation: Reduced Facial ROM: Reduced right Facial Symmetry: Right droop Facial Strength: Reduced Facial Sensation: Reduced Velum:  (UTA) Mandible: Within Functional Limits Motor Speech Overall Motor Speech: Impaired Articulation: Impaired Level of Impairment: Word Intelligibility: Intelligible   GO    Harlon Ditty, MA CCC-SLP [phone]  Claudine Mouton 10/17/2012, 4:32 PM

## 2012-10-17 NOTE — Evaluation (Signed)
Occupational Therapy Evaluation Patient Details Name: Tiffany [name] MRN: [id number] DOB: 30-Jan-1951 Today's Date: 10/17/2012 Time: 7829-5621 OT Time Calculation (min): 28 min  OT Assessment / Plan / Recommendation Clinical Impression  62 yo female Rt hemiplegia, Rt facial droop and aphasia that coudl benefit from skilled OT. Pt s/p TPA and increase from [facility] to [facility] on 10/17/12. Ot to follow acutely. Recommend CIr for d/c planning    OT Assessment  Patient needs continued OT Services    Follow Up Recommendations  CIR    Barriers to Discharge      Equipment Recommendations  3 in 1 bedside comode;Wheelchair (measurements OT);Wheelchair cushion (measurements OT)    Recommendations for Other Services Rehab consult  Frequency  Min 3X/week    Precautions / Restrictions Precautions Precautions: Fall   Pertinent Vitals/Pain Monitored and stable    ADL  Eating/Feeding: NPO Grooming: Brushing hair;+1 Total assistance Where Assessed - Grooming: Supported sitting Toilet Transfer: +2 Total assistance Toilet Transfer: Patient Percentage: 70% Acupuncturist: Raised toilet seat with arms (or 3-in-1 over toilet) Equipment Used: Gait belt Transfers/Ambulation Related to ADLs: Pt completed stand pivot to chair on left side. Pt with Rt Knee hyperextension and pushing to the right side with left UE ADL Comments: pt demonstrates pusher syndrome with Lt UE. pt attempting to place objects in Right hand and demonstrates decr awareness to Rt hand deficits. pt placing comb in right hand and then combing fingers through hair. Pt requried total (A) for hair comb. Pt initiated hand over hand to use left hand and pt placing comb in right hand x3 trials.    OT Diagnosis: Generalized weakness;Cognitive deficits;Hemiplegia dominant side  OT Problem List: Decreased strength;Decreased activity tolerance;Impaired balance (sitting and/or standing);Decreased coordination;Decreased cognition;Decreased  safety awareness;Decreased knowledge of use of DME or AE;Decreased knowledge of precautions;Impaired tone;Obesity;Impaired UE functional use OT Treatment Interventions: Self-care/ADL training;Therapeutic exercise;Neuromuscular education;DME and/or AE instruction;Therapeutic activities;Cognitive remediation/compensation;Visual/perceptual remediation/compensation;Patient/family education;Balance training   OT Goals Acute Rehab OT Goals OT Goal Formulation: With patient Time For Goal Achievement: 10/31/12 Potential to Achieve Goals: Good ADL Goals Pt Will Perform Grooming: with min assist;Sitting, chair;Supported (weight bearing with Rt UE/ using Rt hand during task Min (A)) ADL Goal: Grooming - Progress: Goal set today Pt Will Transfer to Toilet: with mod assist;Ambulation;3-in-1 ADL Goal: Toilet Transfer - Progress: Goal set today Miscellaneous OT Goals Miscellaneous OT Goal #1: Pt will locate 2 out 3 objects on right visual field during adl task without cues OT Goal: Miscellaneous Goal #1 - Progress: Goal set today Miscellaneous OT Goal #2: Pt will complete bed mobility Supervision level as precursor to adls OT Goal: Miscellaneous Goal #2 - Progress: Goal set today Miscellaneous OT Goal #3: Pt will complete 2 step command x3 during session  OT Goal: Miscellaneous Goal #3 - Progress: Goal set today  Visit Information  Last OT Received On: 10/17/12 Assistance Needed: +2 PT/OT Co-Evaluation/Treatment: Yes    Subjective Data  Subjective: "BYE"- pt able to verbalize to therapist at end of session Patient Stated Goal: unable to clearly state but agreeable to OOB this session   Prior Functioning     Home Living Lives With:  (unknown at this time due to expressive aphasia) Additional Comments: Pt responds to yes not questions 6 out 7 correctly Communication Communication: Expressive difficulties Dominant Hand: Right         Vision/Perception Vision - History Baseline Vision: No  visual deficits Vision - Assessment Eye Alignment: Within Functional Limits Vision Assessment: Vision tested  Ocular Range of Motion: Within Functional Limits Alignment/Gaze Preference: Within Defined Limits Tracking/Visual Pursuits: Decreased smoothness of eye movement to RIGHT superior field;Decreased smoothness of eye movement to RIGHT inferior field;Requires cues, head turns, or add eye shifts to track Additional Comments: Pt prefers to locate objects midline and left visual fields   Cognition  Cognition Arousal/Alertness: Awake/alert Behavior During Therapy: WFL for tasks assessed/performed Overall Cognitive Status: Difficult to assess Difficult to assess due to: Impaired communication    Extremity/Trunk Assessment Right Upper Extremity Assessment RUE ROM/Strength/Tone: Deficits;Due to impaired cognition RUE ROM/Strength/Tone Deficits: pt with tone in Right UE and no active movement noted at this time RUE Sensation: Deficits RUE Coordination: Deficits RUE Coordination Deficits: abscent gross grasp at this time Brunstrom I Left Upper Extremity Assessment LUE ROM/Strength/Tone: Within functional levels LUE Sensation: WFL - Light Touch LUE Coordination: WFL - gross/fine motor Trunk Assessment Trunk Assessment: Normal     Mobility Bed Mobility Bed Mobility: Supine to Sit;Sitting - Scoot to Edge of Bed;Sit to Supine Supine to Sit: 2: Max assist;HOB elevated Sitting - Scoot to [place] of Bed: 2: Max assist Sit to Supine: 2: Max assist;HOB elevated Details for Bed Mobility Assistance: Pt needed (A) to squence task and prevent pushing with Lt UE. Pt poor awareness of Rt UE location Transfers Transfers: Sit to Stand;Stand to Sit Sit to Stand: 1: +2 Total assist;With upper extremity assist;From bed Sit to Stand: Patient Percentage: 70% Stand to Sit: 1: +2 Total assist;With upper extremity assist;To chair/3-in-1 Stand to Sit: Patient Percentage: 70% Details for Transfer Assistance:  needed hand over hand to place LT UE. pt with uncontrolled descend to chair. Pt with facilitation at hips to turn to chair. Pt with narrowed base of support pivoting to chair     Exercise     Balance     End of Session OT - End of Session Activity Tolerance: Patient tolerated treatment well Patient left: in chair;with call bell/phone within reach;with chair alarm set Nurse Communication: Mobility status;Precautions  GO     Lucile Shutters 10/17/2012, 3:48 PM Pager: [phone]

## 2012-10-17 NOTE — Consult Note (Signed)
Physical Medicine and Rehabilitation Consult  Reason for Consult: Difficulty speaking and right sided weakness.  Referring Physician: Dr. Pearlean Brownie   HPI: Tiffany [name] is a 62 y.o. female with history of CAD, HTN; who developed difficulty speaking with right sided weakness on am of 10/16/12. CCT with bilateral subcortical hypodensities and patient treated with TPA.  Carotid dopplers with <80% right ICA stenosis. 2 D echo with EF 60-65% and grade one dysfunction. Around midnight on 04/29 patient with progression of symptoms with dense right hemiparesis with inability to follow commands. Follow up CCT with developing focus of low attenuation left insular cortex and deep left frontal white matter, no hemorrhage. MRI brain done revealing  Work up ongoing with therapy evaluations to be done today. MD recommending CIR. L-MCA territory infarct affecting insula, operculum, left motor strip, superior parietal lobe, and left splenium. MRA brain with LMCA occlusion in M1 segment with no distal flow signal. Neurology feels patient with embolic CVA and TEE ordered for further work up. Therapy evaluation reveals patient with right hemiparesis and poor awareness of RUE, pusher syndrome, as well as  moderate to severe expressive language deficits and moderate recpetive language deficits. MD, PT, OT, ST recommending CIR.   Patient essentially nonverbal. She follows commands with 75% accuracy. Yes nos 75% accuracy Review of Systems  Unable to perform ROS: language   Past Medical History  Diagnosis Date  . Coronary artery disease   . Hypertension   . TIA (transient ischemic attack)   . High cholesterol   . MI (myocardial infarction)    Past Surgical History  Procedure Laterality Date  . Cardiac surgery     No family history on file.  Social History: Dortha Kern with husband.  Per reports that she has been smoking.  She does not have any smokeless tobacco history on file. Per reports that she does not drink  alcohol or use illicit drugs.  Allergies: No Known Allergies  No prescriptions prior to admission    Home: Home Living Lives With:  (unknown at this time due to expressive aphasia) Additional Comments: Pt responds to yes not questions 6 out 7 correctly  Functional History:   Functional Status:  Mobility: Bed Mobility Bed Mobility: Supine to Sit;Sitting - Scoot to [place] of Bed;Sit to Supine Supine to Sit: 2: Max assist;HOB elevated Sitting - Scoot to [place] of Bed: 2: Max assist Sit to Supine: 2: Max assist;HOB elevated Transfers Transfers: Stand Pivot Transfers Sit to Stand: 1: +2 Total assist;With upper extremity assist;From bed Sit to Stand: Patient Percentage: 70% Stand to Sit: 1: +2 Total assist;With upper extremity assist;To chair/3-in-1 Stand to Sit: Patient Percentage: 70% Stand Pivot Transfers: 1: +2 Total assist Stand Pivot Transfers: Patient Percentage: 40%      ADL: ADL Eating/Feeding: NPO Grooming: Brushing hair;+1 Total assistance Where Assessed - Grooming: Supported sitting Toilet Transfer: +2 Total assistance Toilet Transfer Equipment: Raised toilet seat with arms (or 3-in-1 over toilet) Equipment Used: Gait belt Transfers/Ambulation Related to ADLs: Pt completed stand pivot to chair on left side. Pt with Rt Knee hyperextension and pushing to the right side with left UE ADL Comments: pt demonstrates pusher syndrome with Lt UE. pt attempting to place objects in Right hand and demonstrates decr awareness to Rt hand deficits. pt placing comb in right hand and then combing fingers through hair. Pt requried total (A) for hair comb. Pt initiated hand over hand to use left hand and pt placing comb in right hand x3 trials.  Cognition: Cognition  Overall Cognitive Status: Impaired/Different from baseline Arousal/Alertness: Awake/alert Orientation Level: Oriented to person Attention: Focused;Sustained;Selective Focused Attention: Appears intact Sustained Attention:  Appears intact Selective Attention: Impaired Selective Attention Impairment: Functional basic Memory:  (UTA) Awareness: Impaired Awareness Impairment: Intellectual impairment;Emergent impairment Problem Solving: Impaired Problem Solving Impairment: Functional basic Executive Function: Initiating Initiating: Impaired Initiating Impairment: Verbal basic;Functional basic Behaviors: Restless Cognition Arousal/Alertness: Awake/alert Behavior During Therapy: WFL for tasks assessed/performed Overall Cognitive Status: Impaired/Different from baseline Difficult to assess due to: Impaired communication  Blood pressure 146/68, pulse 89, temperature 97.3 F (36.3 C), temperature source Oral, resp. rate 18, height 5\' 4"  (1.626 m), weight 86.1 kg (189 lb 13.1 oz), SpO2 97.00%. Physical Exam  Nursing note and vitals reviewed. Constitutional: She appears well-developed and well-nourished.  HENT:  Head: Normocephalic and atraumatic.  Eyes: Pupils are equal, round, and reactive to light.  Neck: Normal range of motion.  Cardiovascular: Normal rate and regular rhythm.   Pulmonary/Chest: Effort normal and breath sounds normal.  Abdominal: Soft. Bowel sounds are normal.  Musculoskeletal: She exhibits no edema.  Neurological: She is alert.  Smiles appropriately. Was able to answer simple questions with halting one word answers and 75% accurate for biographic information with occasional cues. Able to follow simple commands. Right facial weakness. Right hemiparesis--UE>LE with emerging tone.   Skin: Skin is warm and dry.  Motor: Right biceps 2 minus otherwise 0/5 in the right upper extremity. Right lower extremity 3 minus hip flexor knee extensor trace ankle dorsiflexor. Left upper and left lower extremity 5/5. Sensation cannot assess secondary to aphasia Increasing flexor tone right upper extremity  No results found for this or any previous visit (from the past 24 hour(s)).  Tiffany [name]  Contrast 10/17/2012  *RADIOLOGY REPORT*  Clinical Data:  62 year old female status post code stroke with right side symptoms.  Status post t-PA.  Comparison: Head CTs 10/17/2012 and 10/16/2012.  MRI HEAD WITHOUT CONTRAST  Technique: Multiplanar, multiecho pulse sequences of the brain and surrounding structures were obtained according to standard protocol without intravenous contrast.  Findings: Confluent restricted diffusion at the left insula and operculum.  Cortical restricted diffusion continues into the left motor strip.  There is some superimposed subcortical white matter involvement in the posterior left MCA territory. There is a small area of involvement at the left splenium of the corpus callosum (series 3 images 18 and 19).  There is also evidence of medial superior left parietal cortex involvement (series 3 image 22).  Associated T2 and FLAIR hyperintensity compatible with early cytotoxic edema at the affected areas.  There is abnormal increased FLAIR signal also in left posterior MCA branches, see MRA findings below.  No associated acute intracranial hemorrhage.  No mass effect at this time.  No ventriculomegaly.  No definite right hemisphere or posterior fossa restricted diffusion.  There is T2 shine through artifact in the right corona radiata which appears related to chronic white matter lacunar infarcts.  There is superimposed moderate scattered bilateral white matter T2 and FLAIR hyperintensity.  Deep gray matter nuclei are within normal limits. Brainstem, cerebellum, cervicomedullary junction, visualized cervical spine, and pituitary are within normal limits.  Decreased or absent appearance of the distal cervical left ICA and proximal left ICA siphon flow void.  Evidence of flow reconstitution in the left supraclinoid segment.  Other major intracranial vascular flow voids are preserved.  Normal bone marrow signal. Visualized orbit soft tissues are within normal limits.  Trace paranasal sinus mucosal  thickening and left mastoid fluid.  Negative scalp soft tissues.  IMPRESSION: 1.  Left MCA territory acute infarct.  Insula and operculum most affected.  Also patchy involvement of the left motor strip, superior parietal lobe, and left splenium. 2.  Mild cytotoxic edema without mass effect at this time.  No associated intracranial hemorrhage. 3.  See MRA findings below.  Evidence of decreased flow in the left ICA at the skull base and in the posterior left MCA branches.  4.  Underlying moderate cerebral white matter signal changes most suggestive of chronic white matter small vessel ischemia.   MRA HEAD WITHOUT CONTRAST  Technique: Angiographic images of the Circle of Willis were obtained using MRA technique without  intravenous contrast.  Findings: Antegrade flow in the posterior circulation with codominant distal vertebral arteries.  Normal PICA origins.  Patent vertebrobasilar junction.  No basilar stenosis.  SCA and PCA origins are within normal limits.  Posterior communicating arteries are diminutive or absent.  Bilateral PCA branches are within normal limits.  There is antegrade flow in the right ICA siphon, but the right ICA appears mildly diminutive.  Flow signal is preserved to the right ICA terminus without definite focal right ICA stenosis, although intermittent motion degradation occurs.  The right MCA and ACA origins appear patent.  Motion artifact degrades the right MCA branch detail.  The right MCA M1 segment appears patent without stenosis.  Likewise, ACA branches are degraded, the bilateral ACA branches appear grossly patent.  Little to no antegrade flow signal in the distal cervical ICA and left ICA siphon to the proximal supraclinoid segment.  There is some reconstituted flow signal at the left ICA terminus.  The left MCA and ACA origins are patent.  However, the left MCA is occluded in the proximal M1 segment (series 805 image 8.  No left MCA branch flow signal identified.  IMPRESSION: 1.  Left  MCA occluded in the M1 segment with no distal flow signal detected. 2.  Little to no flow in the left distal cervical ICA and majority of the left ICA siphon.  Reconstituted flow signal the left ICA terminus likely from collaterals. 3.  Motion degraded, but the remaining anterior circulation appears within normal limits. 4.  Negative posterior circulation.  Study discussed by telephone with Dr. Leroy Kennedy on 10/17/2012 at 1325 hours.   Original Report Authenticated By: Erskine Speed, M.D.    Dg Chest Port 1 View  10/16/2012  *RADIOLOGY REPORT*  Clinical Data: Stroke.  PORTABLE CHEST - 1 VIEW  Comparison: None.  Findings: Slight peribronchial thickening. Heart and mediastinal contours are within normal limits.  No focal opacities or effusions.  No acute bony abnormality.  IMPRESSION: Slight bronchitic changes.   Original Report Authenticated By: Charlett Nose, M.D.    By: Erskine Speed, M.D.     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 Does the need for close, 24 hr/day medical supervision in concert with the patient's rehab needs make it unreasonable for this patient to be served in a less intensive setting? Yes 2. Co-Morbidities requiring supervision/potential complications: Hypertension, Coronary artery disease, at risk for aspiration pneumonia and DVTs 3. Due to bladder management, bowel management, safety, skin/wound care, disease management, medication administration and patient education, does the patient require 24 hr/day rehab nursing? Yes 4. Does the patient require coordinated care of a physician, rehab nurse, PT (1-2 hrs/day, 5 days/week), OT (1-2 hrs/day, 5 days/week) and SLP (0.5-1 hrs/day, 5 days/week) to address physical and functional deficits in the  context of the above medical diagnosis(es)? Yes Addressing deficits in the following areas: balance, endurance, locomotion, strength, transferring, bowel/bladder control, bathing, dressing,  feeding, grooming, toileting, cognition, speech, language and swallowing 5. Can the patient actively participate in an intensive therapy program of at least 3 hrs of therapy per day at least 5 days per week? Yes 6. The potential for patient to make measurable gains while on inpatient rehab is good 7. Anticipated functional outcomes upon discharge from inpatient rehab are Supervision to minutes his mobility with PT, Supervision min assist ADLs with OT, Express basic needs, accurate with biographical information, follow commands. Maintained adequate intake with SLP. 8. Estimated rehab length of stay to reach the above functional goals is: 3 weeks 9. Does the patient have adequate social supports to accommodate these discharge functional goals? Yes 10. Anticipated D/C setting: Home 11. Anticipated post D/C treatments: HH therapy 12. Overall Rehab/Functional Prognosis: excellent  RECOMMENDATIONS: This patient's condition is appropriate for continued rehabilitative care in the following setting: CIR Patient has agreed to participate in recommended program. Yes Note that insurance prior authorization may be required for reimbursement for recommended care.  Comment:    10/18/2012

## 2012-10-17 NOTE — Progress Notes (Signed)
Agree with OT note Sheran Lawless, [place] [phone] 10/17/2012

## 2012-10-18 ENCOUNTER — Encounter (HOSPITAL_COMMUNITY)
Admission: EM | Disposition: A | Discharge status: Other Acute Inpatient Hospital | Payer: Self-pay | Source: Home / Self Care | Attending: Neurology

## 2012-10-18 ENCOUNTER — Encounter (HOSPITAL_COMMUNITY): Payer: Self-pay | Admitting: *Deleted

## 2012-10-18 ENCOUNTER — Inpatient Hospital Stay (HOSPITAL_COMMUNITY): Payer: BC Managed Care – PPO

## 2012-10-18 DIAGNOSIS — G8110 Spastic hemiplegia affecting unspecified side: Secondary | ICD-10-CM

## 2012-10-18 DIAGNOSIS — I69920 Aphasia following unspecified cerebrovascular disease: Secondary | ICD-10-CM

## 2012-10-18 DIAGNOSIS — I6330 Cerebral infarction due to thrombosis of unspecified cerebral artery: Secondary | ICD-10-CM

## 2012-10-18 DIAGNOSIS — G936 Cerebral edema: Secondary | ICD-10-CM

## 2012-10-18 DIAGNOSIS — R4701 Aphasia: Secondary | ICD-10-CM

## 2012-10-18 DIAGNOSIS — I6789 Other cerebrovascular disease: Secondary | ICD-10-CM

## 2012-10-18 HISTORY — PX: TEE WITHOUT CARDIOVERSION: SHX5443

## 2012-10-18 SURGERY — ECHOCARDIOGRAM, TRANSESOPHAGEAL
Anesthesia: Moderate Sedation

## 2012-10-18 MED ORDER — MIDAZOLAM HCL 10 MG/2ML IJ SOLN
INTRAMUSCULAR | Status: DC | PRN
  Administered 2012-10-18: 1 mg via INTRAVENOUS
  Administered 2012-10-18: 2 mg via INTRAVENOUS

## 2012-10-18 MED ORDER — FENTANYL CITRATE 0.05 MG/ML IJ SOLN
INTRAMUSCULAR | Status: AC
  Filled 2012-10-18: qty 2

## 2012-10-18 MED ORDER — MIDAZOLAM HCL 5 MG/ML IJ SOLN
INTRAMUSCULAR | Status: AC
  Filled 2012-10-18: qty 2

## 2012-10-18 MED ORDER — SODIUM CHLORIDE 0.9 % IV SOLN: INTRAVENOUS | Status: DC

## 2012-10-18 MED ORDER — SODIUM CHLORIDE 0.9 % IV SOLN
INTRAVENOUS | Status: DC
  Administered 2012-10-18: 15:00:00 via INTRAVENOUS

## 2012-10-18 MED ORDER — BUTAMBEN-TETRACAINE-BENZOCAINE 2-2-14 % EX AERO
INHALATION_SPRAY | CUTANEOUS | Status: DC | PRN
  Administered 2012-10-18: 2 via TOPICAL

## 2012-10-18 MED ORDER — FENTANYL CITRATE 0.05 MG/ML IJ SOLN
INTRAMUSCULAR | Status: DC | PRN
  Administered 2012-10-18: 25 ug via INTRAVENOUS

## 2012-10-18 NOTE — Progress Notes (Signed)
Physical Therapy Treatment Patient Details Name: Harumi Yamin MRN: [id number] DOB: 03/12/1951 Today's Date: 10/18/2012 Time: 4098-1191 PT Time Calculation (min): 47 min  PT Assessment / Plan / Recommendation Comments on Treatment Session  Emphasized working on scooting, sitting balance, sit to squats to work on translating forward and sit to stand to work on standing balance.  Great rehab candidate.    Follow Up Recommendations  CIR     Does the patient have the potential to tolerate intense rehabilitation     Barriers to Discharge        Equipment Recommendations  Other (comment) (To be determined post acute)    Recommendations for Other Services Rehab consult  Frequency Min 4X/week   Plan Discharge plan remains appropriate;Frequency remains appropriate    Precautions / Restrictions Precautions Precautions: Fall Precaution Comments: no pushing noted   Pertinent Vitals/Pain     Mobility  Bed Mobility Bed Mobility: Not assessed Transfers Transfers: Sit to Stand;Stand to Sit Sit to Stand: 1: +2 Total assist;With upper extremity assist;From chair/3-in-1 (approximently 8-10 trials) Sit to Stand: Patient Percentage: 70% Stand to Sit: 1: +2 Total assist;With upper extremity assist;To chair/3-in-1 Stand to Sit: Patient Percentage: 70% Details for Transfer Assistance: majority of the treatment centered around transfers sit to standing.  incl set up. and "normal" symmetrical transitions.  pt. still needed stability assist and help coming forward. Ambulation/Gait Ambulation/Gait Assistance: Not tested (comment) Stairs: No Modified Rankin (Stroke Patients Only) Pre-Morbid Rankin Score: No symptoms Modified Rankin: Severe disability    Exercises     PT Diagnosis:    PT Problem List:   PT Treatment Interventions:     PT Goals Acute Rehab PT Goals Time For Goal Achievement: 10/31/12 Potential to Achieve Goals: Good Pt will Roll Supine to Right Side: with min assist;with  rail PT Goal: Rolling Supine to Right Side - Progress: Not met Pt will go Supine/Side to Sit: with min assist PT Goal: Supine/Side to Sit - Progress: Not met Pt will Sit at [facility] of Bed: with modified independence PT Goal: Sit at Edge Of Bed - Progress: Progressing toward goal Pt will go Sit to Supine/Side: with min assist PT Goal: Sit to Supine/Side - Progress: Not met Pt will go Sit to Stand: with min assist PT Goal: Sit to Stand - Progress: Progressing toward goal Pt will go Stand to Sit: with min assist PT Goal: Stand to Sit - Progress: Progressing toward goal Pt will Transfer Bed to Chair/Chair to Bed: with mod assist PT Transfer Goal: Bed to Chair/Chair to Bed - Progress: Progressing toward goal Pt will Stand: with min assist;1 - 2 min;with unilateral upper extremity support PT Goal: Stand - Progress: Progressing toward goal Pt will Ambulate: 16 - 50 feet;with mod assist;with least restrictive assistive device PT Goal: Ambulate - Progress: Not met  Visit Information  Last PT Received On: 10/18/12 Assistance Needed: +2 PT/OT Co-Evaluation/Treatment: Yes    Subjective Data  Subjective: "Yes, I will" to asking for verbal response.   Cognition  Cognition Arousal/Alertness: Awake/alert Behavior During Therapy: WFL for tasks assessed/performed Overall Cognitive Status: Impaired/Different from baseline (but improving, minor inattension difficulties on R side) Area of Impairment: Attention;Following commands;Safety/judgement Current Attention Level: Sustained Following Commands: Follows one step commands with increased time;Follows one step commands inconsistently    Balance  Balance Balance Assessed: Yes Static Sitting Balance Static Sitting - Balance Support: Feet supported;Left upper extremity supported Static Sitting - Level of Assistance: Other (comment) (min guard in chair with improved response  after practice) Static Sitting - Comment/# of Minutes: up to 10 min between  standing trials Dynamic Sitting Balance Dynamic Sitting - Balance Support: Feet unsupported;During functional activity;Left upper extremity supported;Right upper extremity supported Dynamic Sitting - Level of Assistance: 4: Min assist Static Standing Balance Static Standing - Balance Support: During functional activity;Left upper extremity supported;Right upper extremity supported Static Standing - Level of Assistance: 3: Mod assist;4: Min assist Static Standing - Comment/# of Minutes: up to 2 min each of 3 standing trials working on w/shifts, upright stance, knee control activity.  End of Session PT - End of Session Equipment Utilized During Treatment: Gait belt Activity Tolerance: Patient tolerated treatment well Patient left: in chair;with call bell/phone within reach;with family/visitor present Nurse Communication: Mobility status   GP     Edlin Ford, Eliseo Gum 10/18/2012, 12:53 PM 10/18/2012  [place] Bing, PT [phone] (pager)

## 2012-10-18 NOTE — Progress Notes (Signed)
Occupational Therapy Treatment Patient Details Name: Tiffany [name] MRN: [id number] DOB: [birth year] Today's Date: 10/18/2012 Time: 7829-5621 OT Time Calculation (min): 50 min  OT Assessment / Plan / Recommendation Comments on Treatment Session Pt progressing toward goals and continues to be an excellent CIR candidate.      Follow Up Recommendations  CIR    Barriers to Discharge       Equipment Recommendations  3 in 1 bedside comode;Wheelchair (measurements OT);Wheelchair cushion (measurements OT)    Recommendations for Other Services Rehab consult  Frequency Min 3X/week   Plan Discharge plan remains appropriate    Precautions / Restrictions Precautions Precautions: Fall Precaution Comments: no pushing noted   Pertinent Vitals/Pain See vitals    ADL  Grooming: Performed;Wash/dry face;Maximal assistance Where Assessed - Grooming: Supported sitting Toilet Transfer: Chief of Staff: Patient Percentage: 70% Statistician Method: Sit to Barista: Other (comment) (recliner) Equipment Used: Gait belt Transfers/Ambulation Related to ADLs: Pt performed multiple sit<>stands from chair at countertop in room.  Min-mod manual assist to R knee for stability esp during weight shifting.   ADL Comments: Focus on sitting balance EOC and standing balance. Pt requiring multimodal cues for anterior lean which sitting EOC as precursor for power up.  Pt demonstrating neglect of RUE. Brunstrom level 2 in RUE.  Pt with RUE tone and at times drawing RUE into flexor position with internal rotation (nonvolunatry).  No active movement noted in hand.     OT Diagnosis:    OT Problem List:   OT Treatment Interventions:     OT Goals ADL Goals Pt Will Perform Grooming: with min assist;Sitting, chair;Supported ADL Goal: Grooming - Progress: Progressing toward goals Pt Will Transfer to Toilet: with mod assist;Ambulation;3-in-1 ADL Goal: Toilet  Transfer - Progress: Progressing toward goals  Visit Information  Last OT Received On: 10/18/12 Assistance Needed: +2 PT/OT Co-Evaluation/Treatment: Yes    Subjective Data  Subjective: "Good"- replying to therapist telling her she is making good progress   Prior Functioning       Cognition  Cognition Arousal/Alertness: Awake/alert Behavior During Therapy: WFL for tasks assessed/performed Overall Cognitive Status: Impaired/Different from baseline Area of Impairment: Attention;Following commands;Safety/judgement Current Attention Level: Sustained Following Commands: Follows one step commands with increased time;Follows one step commands inconsistently Difficult to assess due to: Impaired communication    Mobility  Bed Mobility Bed Mobility: Not assessed (pt up in chair) Transfers Transfers: Sit to Stand;Stand to Sit Sit to Stand: 1: +2 Total assist;From chair/3-in-1;With armrests;With upper extremity assist Sit to Stand: Patient Percentage: 70% Stand to Sit: 1: +2 Total assist;With upper extremity assist;To chair/3-in-1 Stand to Sit: Patient Percentage: 70% Details for Transfer Assistance: majority of the treatment centered around transfers sit to standing.  incl set up. and "normal" symmetrical transitions.  pt. still needed stability assist and help coming forward.    Exercises      Balance Balance Balance Assessed: Yes Static Sitting Balance Static Sitting - Balance Support: Feet supported;Left upper extremity supported Static Sitting - Level of Assistance: 5: Stand by assistance;4: Min assist Static Sitting - Comment/# of Minutes: min guard EOC with multimodal cues. Pt with improved balance and less cueing toward end of session. Dynamic Sitting Balance Dynamic Sitting - Balance Support: Feet unsupported;During functional activity;Left upper extremity supported;Right upper extremity supported Dynamic Sitting - Level of Assistance: 4: Min assist Static Standing  Balance Static Standing - Balance Support: During functional activity;Left upper extremity supported;Right upper extremity supported Static Standing - Level  of Assistance: 3: Mod assist;4: Min assist Static Standing - Comment/# of Minutes: up to 2 min each of 3 standing trials working on w/shifts, upright stance, knee control activity.   End of Session OT - End of Session Equipment Utilized During Treatment: Gait belt Activity Tolerance: Patient tolerated treatment well Patient left: in chair;with call bell/phone within reach;with family/visitor present Nurse Communication: Mobility status  GO   10/18/2012 Cipriano Mile OTR/L Pager [phone] Office [phone]   Cipriano Mile 10/18/2012, 4:02 PM

## 2012-10-18 NOTE — Progress Notes (Signed)
Patient holding head. When asked if she had a headache she nodded yes. Dr. Thad Ranger notified. Order received for tylenol. Will continue to monitor.

## 2012-10-18 NOTE — Progress Notes (Signed)
Stroke Team Progress Note  HISTORY Tiffany [name] is an 62 y.o. female with a past medical history significant for hypertension, hypercholesterolemia, CAD s/p MI and s/p stenting, TIA last year, brought to [facility] by medics as a code stroke due to acute onset right hemiparesis, aphasia, and right face droopiness. Patient's husband stated that Tiffany [name] woke up at 7 am 10/16/2012 feeling well, had breakfast, and around 7:50 developed weakness of the right side of her body and couldn't talk. EMS was called and when they arrived to the scene she was not able to follow commands, aphasic with right hemiparesis and right face weakness. Upon arrival to ED she scored 13 in the NIHSS and had an emergent CT brain that showed a subtle area of deceased attenuation in the left insula and left operculum. Patient is alert but aphasic. Patient was a TPA candidate. She was admitted to the neuro ICU for further evaluation and treatment.  SUBJECTIVE Her daughter is at the bedside. Therapy is working with patient.   Additional history obtained 10/18/2012 from daughter:  on August 28, 2012 patient experienced right sided weakness, seen by MD and diagnosed with back problems. She did therapy without improvement. She was seen to a neurologist, Dr. Lendell Caprice in [place], who ordered an MRI which showed a "small stroke" and suggested carotid occluion. Based on MRI report,  MRA was ordered MRA was done last week at [facility]. Per daughter, Dr. Lendell Caprice never recevied results.   Dr Pearlean Brownie searched for MRI/ MRA results yesterday in local radiology groups without success. Per daughter, test was done under Tiffany [name] as that was the patient';s name listed on her insurance card, though it is not her legal name.  OBJECTIVE Most recent Vital Signs: Filed Vitals:   10/17/12 1905 10/17/12 2116 10/18/12 0230 10/18/12 0513  BP: 137/64 142/71 132/59 146/68  Pulse: 90 99 92 89  Temp: 98.4 F (36.9 C) 98.3 F (36.8 C) 98.1 F  (36.7 C) 97.3 F (36.3 C)  TempSrc: Oral Oral Oral Oral  Resp: 17 16 16 18   Height:      Weight:      SpO2: 97% 98% 97% 97%   CBG (last 3)   Recent Labs  10/16/12 1604  GLUCAP 101*    IV Fluid Intake:   . sodium chloride 75 mL/hr at 10/17/12 0700    MEDICATIONS  . antiseptic oral rinse  15 mL Mouth Rinse BID  . aspirin  300 mg Rectal Daily   Or  . aspirin  325 mg Oral Daily  . enoxaparin (LOVENOX) injection  40 mg Subcutaneous Q24H  . pantoprazole (PROTONIX) IV  40 mg Intravenous QHS   PRN:  acetaminophen, labetalol, ondansetron (ZOFRAN) IV, senna-docusate  Diet:  NPO 3 thin liquids Activity:  Bedrest DVT Prophylaxis:  SCDs   CLINICALLY SIGNIFICANT STUDIES Basic Metabolic Panel:   Recent Labs Lab 10/16/12 1000 10/16/12 1010  NA 138 139  K 4.3 4.2  CL 101 105  CO2 25  --   GLUCOSE 116* 115*  BUN 10 9  CREATININE 0.83 0.90  CALCIUM 9.7  --    Liver Function Tests:   Recent Labs Lab 10/16/12 1000  AST 16  ALT 10  ALKPHOS 104  BILITOT 0.3  PROT 7.1  ALBUMIN 3.8   CBC:   Recent Labs Lab 10/16/12 1000 10/16/12 1010  WBC 11.8*  --   NEUTROABS 6.6  --   HGB 15.1* 15.6*  HCT 42.6 46.0  MCV 88.6  --  PLT 241  --    Coagulation:   Recent Labs Lab 10/16/12 1000  LABPROT 12.7  INR 0.96   Cardiac Enzymes:   Recent Labs Lab 10/16/12 1000  TROPONINI <0.30   Urinalysis:   Recent Labs Lab 10/16/12 1519  COLORURINE YELLOW  LABSPEC 1.013  PHURINE 5.5  GLUCOSEU NEGATIVE  HGBUR NEGATIVE  BILIRUBINUR NEGATIVE  KETONESUR NEGATIVE  PROTEINUR NEGATIVE  UROBILINOGEN 0.2  NITRITE NEGATIVE  LEUKOCYTESUR NEGATIVE   Lipid Panel     Component Value Date/Time   CHOL 170 10/17/2012 0706   TRIG 132 10/17/2012 0706   HDL 36* 10/17/2012 0706   CHOLHDL 4.7 10/17/2012 0706   VLDL 26 10/17/2012 0706   LDLCALC 108* 10/17/2012 0706   HgbA1C  Lab Results  Component Value Date   HGBA1C 5.8* 10/17/2012   Urine Drug Screen:     Component Value  Date/Time   LABOPIA NONE DETECTED 10/16/2012 1519   COCAINSCRNUR NONE DETECTED 10/16/2012 1519   LABBENZ NONE DETECTED 10/16/2012 1519   AMPHETMU NONE DETECTED 10/16/2012 1519   THCU NONE DETECTED 10/16/2012 1519   LABBARB NONE DETECTED 10/16/2012 1519    Alcohol Level:   Recent Labs Lab 10/16/12 1000  ETH <11   CT of the brain   10/17/2012   Developing focus of low attenuation change with indistinct gray- white junctions in the left insular cortex and anterior deep white matter are consistent with early changes of acute infarct.  No acute intracranial hemorrhage.    10/16/2012  No acute abnormality.    10/16/2012   Cannot exclude early cytotoxic edema in the insula.  Bilateral subcortical white matter hypodensity noted, including at the left operculum.  No acute intracranial hemorrhage.  No suspicious intracranial vascular hyperdensity.    MRI of the brain  10/17/2012   1.  Left MCA territory acute infarct.  Insula and operculum most affected.  Also patchy involvement of the left motor strip, superior parietal lobe, and left splenium. 2.  Mild cytotoxic edema without mass effect at this time.  No associated intracranial hemorrhage. 3.  See MRA findings below.  Evidence of decreased flow in the left ICA at the skull base and in the posterior left MCA branches.  4.  Underlying moderate cerebral white matter signal changes most suggestive of chronic white matter small vessel ischemia.    MRA of the brain  10/17/2012  1.  Left MCA occluded in the M1 segment with no distal flow signal detected. 2.  Little to no flow in the left distal cervical ICA and majority of the left ICA siphon.  Reconstituted flow signal the left ICA terminus likely from collaterals. 3.  Motion degraded, but the remaining anterior circulation appears within normal limits. 4.  Negative posterior circulation.    2D Echocardiogram  EF 60-65% with no source of embolus.   Carotid Doppler  Left ICA occluded and <80% right ICA  stenosis.  CXR  10/16/2012  Slight bronchitic changes.    EKG  normal sinus rhythm.   Therapy Recommendations CIR  Physical Exam  Pleasant elderly caucasian lady not in distress.Awake alert. Afebrile. Head is nontraumatic. Neck is supple without bruit.  Cardiac exam no murmur or gallop. Lungs are clear to auscultation. Distal pulses are well felt. Neurological Exam : Awake and alert significant expressive aphasia and speaks only a few words with dysarthria. Unable to speak sentences, name, repeat a right. Can follow simple midline commands only. Left gaze preference but can look to the right past  midline. Does not blink to threat on the right but does so on the left. Severe right lower facial weakness. Tongue is midline. Cough and gag a week. Dense right upper extremity plegia with 0/5 strength with minimum withdrawal to painful stimuli. Mild right lower extremity drift with 3/5 strength. Right plantar is upgoing. There appears diminished sensation on the right. Normal strength on the left.  ASSESSMENT Tiffany [name] is a 62 y.o. female presenting with acute onset right hemiparesis, aphasia, and right face droopiness. Patient status post IV t-PA 10/16/2012 at 1038. Initial maging suggests a left insular & frontal cortex infarct. MRI pending. Mild neuro worsening overnight with NIHSS increased 2 pts; CT unrevealing for acute abnormality. Source of infarct unknown at this time.  No antiplatelets documented prior to admission. Now on aspirin for secondary stroke prevention. Patient with resultant expressive and receptive aphasia, right hemiparesis (face, arm and leg), right field cut. Work up underway.  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   Hospital day # 2  TREATMENT/PLAN  Continue  aspirin for secondary stroke prevention. TEE  to look for embolic source.If positive for PFO (patent foramen ovale), check bilateral lower extremity venous dopplers to rule out DVT as possible source of stroke.  D2 thin liquid diet with pills crushed after TEE  Rehab consult  DR. Pearlean Brownie will look for MRI/MRA results done as an OP under Tiffany [name] as above  Post rehab, patient will likely need right ICA stenting  Long discussion with husband and daughter about her clinical presentation, diagnostic testing, plan of care and answered questions  Tiffany Main, MSN, RN, ANVP-BC, ANP-BC, [facility] Pager: [phone] 10/18/2012 11:06 AM  I have personally obtained a history, examined the patient, evaluated imaging results, and formulated the assessment and plan of care. I agree with the above. Delia Heady, MD

## 2012-10-18 NOTE — Progress Notes (Signed)
  Echocardiogram Echocardiogram Transesophageal has been performed.  [place]ian Co 10/18/2012, 4:09 PM

## 2012-10-18 NOTE — CV Procedure (Signed)
    Transesophageal Echocardiogram Note  Maciah Feeback [id number] 06-30-1950  Procedure: Transesophageal Echocardiogram Indications: cva   Procedure Details Consent: Obtained Time Out: Verified patient identification, verified procedure, site/side was marked, verified correct patient position, special equipment/implants available, Radiology Safety Procedures followed,  medications/allergies/relevent history reviewed, required imaging and test results available.  Performed  Medications: Fentanyl: 25 mcg IV Versed: 2 mg IV  Left Ventrical:  Normal LV function  Mitral Valve: trace MR,   Aortic Valve: normal  Tricuspid Valve: normal  Pulmonic Valve: normal  Left Atrium/ Left atrial appendage: no thrombus  Atrial septum: mobile but no PFO or ASD  Aorta: mild calcified plaque   Complications: No apparent complications Patient did tolerate procedure well.   Vesta Mixer, Montez Hageman., MD, [facility] 10/18/2012, 3:38 PM

## 2012-10-18 NOTE — Procedures (Signed)
Objective Swallowing Evaluation: Modified Barium Swallowing Study  Patient Details  Name: Tiffany [name] MRN: [id number] Date of Birth: 04-02-1951  Today's Date: 10/18/2012 Time: 0950-1015 SLP Time Calculation (min): 25 min  Past Medical History:  Past Medical History  Diagnosis Date  . Coronary artery disease   . Hypertension   . TIA (transient ischemic attack)   . High cholesterol   . MI (myocardial infarction)    Past Surgical History:  Past Surgical History  Procedure Laterality Date  . Cardiac surgery     HPI:  Tiffany [name] is an 62 y.o. female with a past medical history significant for hypertension, hypercholesterolemia, CAD s/p MI and s/p stenting, TIA last year, brought [facility] by medics as a code stroke due to acute onset right hemiparesis, aphasia, and right face droopiness.  Pt with left mca distribution infarct, given tPA. Neurological worsening after first nights admission.      Assessment / Plan / Recommendation Clinical Impression  Dysphagia Diagnosis: Moderate oral phase dysphagia;Mild pharyngeal phase dysphagia Clinical impression: Pt presents with a primary oral dysphagia with sensory motor deficits. There is right labial and buccal weakness and numbness leading to right anterior spillage and right oral residuals. Pts lingual function is also weak with pt struggling with anterior-postior transit, requiring multiple attempts to fully transit solid boluses with supervison to min ver verbal cues for initiation. With thin liquids the pt briefly hold bolus with strong base of tongue elevation to palate though there is some mild premature spillage of liquid bolsues to valleculae and pyriform sinus leading to trace sensed penetration. Otherwise, pharyngeal phase is WNL and no further aspiration/penetraiton observed. Pt may initate a dys 2 (fine chop) diet with thin liquids. Pills will need to be crushed in puree as pt struggles with oral transit of pills. SLP will f/u  for further threapeutic intervention and education.     Treatment Recommendation  Therapy as outlined in treatment plan below    Diet Recommendation Dysphagia 2 (Fine chop);Thin liquid   Liquid Administration via: Cup;Straw Medication Administration: Crushed with puree Supervision: Patient able to self feed (needs assist with set up. ) Compensations: Check for pocketing;Check for anterior loss;Slow rate;Small sips/bites Postural Changes and/or Swallow Maneuvers: Seated upright 90 degrees;Upright 30-60 min after meal    Other  Recommendations Oral Care Recommendations: Oral care BID   Follow Up Recommendations  Inpatient Rehab    Frequency and Duration min 2x/week  2 weeks   Pertinent Vitals/Pain NA    SLP Swallow Goals Patient will utilize recommended strategies during swallow to increase swallowing safety with: Minimal cueing Swallow Study Goal #2 - Progress: Progressing toward goal   General HPI: Tiffany [name] is an 62 y.o. female with a past medical history significant for hypertension, hypercholesterolemia, CAD s/p MI and s/p stenting, TIA last year, brought [facility] by medics as a code stroke due to acute onset right hemiparesis, aphasia, and right face droopiness.  Pt with left mca distribution infarct, given tPA. Neurological worsening after first nights admission.  Type of Study: Modified Barium Swallowing Study Reason for Referral: Objectively evaluate swallowing function Previous Swallow Assessment: BSE - diet, change in status, NPO Diet Prior to this Study: NPO Temperature Spikes Noted: No Respiratory Status: Room air History of Recent Intubation: No Behavior/Cognition: Alert;Cooperative;Pleasant mood Oral Cavity - Dentition: Adequate natural dentition Oral Motor / Sensory Function: Impaired - see Bedside swallow eval Self-Feeding Abilities: Able to feed self;Needs assist Patient Positioning: Upright in chair Baseline Vocal Quality: Clear Volitional  Cough:  Strong Volitional Swallow: Able to elicit Anatomy: Within functional limits Pharyngeal Secretions: Not observed secondary MBS    Reason for Referral Objectively evaluate swallowing function   Oral Phase Oral Preparation/Oral Phase Oral Phase: Impaired Oral - Thin Oral - Thin Cup: Right anterior bolus loss;Impaired mastication;Weak lingual manipulation;Incomplete tongue to palate contact;Reduced posterior propulsion;Right pocketing in lateral sulci;Lingual/palatal residue;Piecemeal swallowing;Delayed oral transit Oral - Thin Straw: Right anterior bolus loss;Impaired mastication;Weak lingual manipulation;Incomplete tongue to palate contact;Reduced posterior propulsion;Right pocketing in lateral sulci;Lingual/palatal residue;Piecemeal swallowing;Delayed oral transit Oral - Solids Oral - Puree: Right anterior bolus loss;Impaired mastication;Weak lingual manipulation;Incomplete tongue to palate contact;Reduced posterior propulsion;Right pocketing in lateral sulci;Lingual/palatal residue;Piecemeal swallowing;Delayed oral transit Oral - Mechanical Soft: Right anterior bolus loss;Impaired mastication;Weak lingual manipulation;Incomplete tongue to palate contact;Reduced posterior propulsion;Right pocketing in lateral sulci;Lingual/palatal residue;Piecemeal swallowing;Delayed oral transit Oral - Pill: Other (Comment) (unable to transit, fell to right buccal cavity, became unawa)   Pharyngeal Phase Pharyngeal Phase Pharyngeal Phase: Impaired Pharyngeal - Thin Pharyngeal - Thin Cup: Premature spillage to pyriform sinuses;Penetration/Aspiration before swallow Penetration/Aspiration details (thin cup): Material enters airway, CONTACTS cords then ejected out;Material enters airway, remains ABOVE vocal cords and not ejected out;Material does not enter airway Pharyngeal - Thin Straw: Premature spillage to pyriform sinuses;Penetration/Aspiration before swallow Penetration/Aspiration details (thin straw):  Material does not enter airway Pharyngeal - Solids Pharyngeal - Puree: Within functional limits Penetration/Aspiration details (puree): Material does not enter airway Pharyngeal - Mechanical Soft: Within functional limits Pharyngeal - Pill: Not tested (pt could not transit)  Cervical Esophageal Phase    GO    Cervical Esophageal Phase Cervical Esophageal Phase: [facility]        Harlon Ditty, MA CCC-SLP [phone]  Salima Rumer, Riley Nearing 10/18/2012, 10:36 AM

## 2012-10-18 NOTE — H&P (View-Only) (Signed)
Stroke Team Progress Note  HISTORY Tiffany [name] is an 61 y.o. female with a past medical history significant for hypertension, hypercholesterolemia, CAD s/p MI and s/p stenting, TIA last year, brought [facility] by medics as a code stroke due to acute onset right hemiparesis, aphasia, and right face droopiness. Patient's husband stated that Tiffany [name] woke up at 7 am 10/16/2012 feeling well, had breakfast, and around 7:50 developed weakness of the right side of her body and couldn't talk. EMS was called and when they arrived to the scene she was not able to follow commands, aphasic with right hemiparesis and right face weakness. Upon arrival to ED she scored 13 in the NIHSS and had an emergent CT brain that showed a subtle area of deceased attenuation in the left insula and left operculum. Patient is alert but aphasic. Patient was a TPA candidate. She was admitted to the neuro ICU for further evaluation and treatment.  SUBJECTIVE Her daughter is at the bedside. Therapy is working with patient.   Additional history obtained 10/18/2012 from daughter:  on August 28, 2012 patient experienced right sided weakness, seen by MD and diagnosed with back problems. She did therapy without improvement. She was seen to a neurologist, Dr. Sullivan in [place], who ordered an MRI which showed a "small stroke" and suggested carotid occluion. Based on MRI report,  MRA was ordered MRA was done last week at [facility]. Per daughter, Dr. Sullivan never recevied results.   Dr Lilymarie Scroggins searched for MRI/ MRA results yesterday in local radiology groups without success. Per daughter, test was done under [facility] as that was the patient';s name listed on her insurance card, though it is not her legal name.  OBJECTIVE Most recent Vital Signs: Filed Vitals:   10/17/12 1905 10/17/12 2116 10/18/12 0230 10/18/12 0513  BP: 137/64 142/71 132/59 146/68  Pulse: 90 99 92 89  Temp: 98.4 F (36.9 C) 98.3 F (36.8 C) 98.1 F  (36.7 C) 97.3 F (36.3 C)  TempSrc: Oral Oral Oral Oral  Resp: 17 16 16 18  Height:      Weight:      SpO2: 97% 98% 97% 97%   CBG (last 3)   Recent Labs  10/16/12 1604  GLUCAP 101*    IV Fluid Intake:   . sodium chloride 75 mL/hr at 10/17/12 0700    MEDICATIONS  . antiseptic oral rinse  15 mL Mouth Rinse BID  . aspirin  300 mg Rectal Daily   Or  . aspirin  325 mg Oral Daily  . enoxaparin (LOVENOX) injection  40 mg Subcutaneous Q24H  . pantoprazole (PROTONIX) IV  40 mg Intravenous QHS   PRN:  acetaminophen, labetalol, ondansetron (ZOFRAN) IV, senna-docusate  Diet:  NPO 3 thin liquids Activity:  Bedrest DVT Prophylaxis:  SCDs   CLINICALLY SIGNIFICANT STUDIES Basic Metabolic Panel:   Recent Labs Lab 10/16/12 1000 10/16/12 1010  NA 138 139  K 4.3 4.2  CL 101 105  CO2 25  --   GLUCOSE 116* 115*  BUN 10 9  CREATININE 0.83 0.90  CALCIUM 9.7  --    Liver Function Tests:   Recent Labs Lab 10/16/12 1000  AST 16  ALT 10  ALKPHOS 104  BILITOT 0.3  PROT 7.1  ALBUMIN 3.8   CBC:   Recent Labs Lab 10/16/12 1000 10/16/12 1010  WBC 11.8*  --   NEUTROABS 6.6  --   HGB 15.1* 15.6*  HCT 42.6 46.0  MCV 88.6  --     PLT 241  --    Coagulation:   Recent Labs Lab 10/16/12 1000  LABPROT 12.7  INR 0.96   Cardiac Enzymes:   Recent Labs Lab 10/16/12 1000  TROPONINI <0.30   Urinalysis:   Recent Labs Lab 10/16/12 1519  COLORURINE YELLOW  LABSPEC 1.013  PHURINE 5.5  GLUCOSEU NEGATIVE  HGBUR NEGATIVE  BILIRUBINUR NEGATIVE  KETONESUR NEGATIVE  PROTEINUR NEGATIVE  UROBILINOGEN 0.2  NITRITE NEGATIVE  LEUKOCYTESUR NEGATIVE   Lipid Panel     Component Value Date/Time   CHOL 170 10/17/2012 0706   TRIG 132 10/17/2012 0706   HDL 36* 10/17/2012 0706   CHOLHDL 4.7 10/17/2012 0706   VLDL 26 10/17/2012 0706   LDLCALC 108* 10/17/2012 0706   HgbA1C  Lab Results  Component Value Date   HGBA1C 5.8* 10/17/2012   Urine Drug Screen:     Component Value  Date/Time   LABOPIA NONE DETECTED 10/16/2012 1519   COCAINSCRNUR NONE DETECTED 10/16/2012 1519   LABBENZ NONE DETECTED 10/16/2012 1519   AMPHETMU NONE DETECTED 10/16/2012 1519   THCU NONE DETECTED 10/16/2012 1519   LABBARB NONE DETECTED 10/16/2012 1519    Alcohol Level:   Recent Labs Lab 10/16/12 1000  ETH <11   CT of the brain   10/17/2012   Developing focus of low attenuation change with indistinct gray- white junctions in the left insular cortex and anterior deep white matter are consistent with early changes of acute infarct.  No acute intracranial hemorrhage.    10/16/2012  No acute abnormality.    10/16/2012   Cannot exclude early cytotoxic edema in the insula.  Bilateral subcortical white matter hypodensity noted, including at the left operculum.  No acute intracranial hemorrhage.  No suspicious intracranial vascular hyperdensity.    MRI of the brain  10/17/2012   1.  Left MCA territory acute infarct.  Insula and operculum most affected.  Also patchy involvement of the left motor strip, superior parietal lobe, and left splenium. 2.  Mild cytotoxic edema without mass effect at this time.  No associated intracranial hemorrhage. 3.  See MRA findings below.  Evidence of decreased flow in the left ICA at the skull base and in the posterior left MCA branches.  4.  Underlying moderate cerebral white matter signal changes most suggestive of chronic white matter small vessel ischemia.    MRA of the brain  10/17/2012  1.  Left MCA occluded in the M1 segment with no distal flow signal detected. 2.  Little to no flow in the left distal cervical ICA and majority of the left ICA siphon.  Reconstituted flow signal the left ICA terminus likely from collaterals. 3.  Motion degraded, but the remaining anterior circulation appears within normal limits. 4.  Negative posterior circulation.    2D Echocardiogram  EF 60-65% with no source of embolus.   Carotid Doppler  Left ICA occluded and <80% right ICA  stenosis.  CXR  10/16/2012  Slight bronchitic changes.    EKG  normal sinus rhythm.   Therapy Recommendations CIR  Physical Exam  Pleasant elderly caucasian lady not in distress.Awake alert. Afebrile. Head is nontraumatic. Neck is supple without bruit.  Cardiac exam no murmur or gallop. Lungs are clear to auscultation. Distal pulses are well felt. Neurological Exam : Awake and alert significant expressive aphasia and speaks only a few words with dysarthria. Unable to speak sentences, name, repeat a right. Can follow simple midline commands only. Left gaze preference but can look to the right past   midline. Does not blink to threat on the right but does so on the left. Severe right lower facial weakness. Tongue is midline. Cough and gag a week. Dense right upper extremity plegia with 0/5 strength with minimum withdrawal to painful stimuli. Mild right lower extremity drift with 3/5 strength. Right plantar is upgoing. There appears diminished sensation on the right. Normal strength on the left.  ASSESSMENT Ms. Tiffany [name] is a 61 y.o. female presenting with acute onset right hemiparesis, aphasia, and right face droopiness. Patient status post IV t-PA 10/16/2012 at 1038. Initial maging suggests a left insular & frontal cortex infarct. MRI pending. Mild neuro worsening overnight with NIHSS increased 2 pts; CT unrevealing for acute abnormality. Source of infarct unknown at this time.  No antiplatelets documented prior to admission. Now on aspirin for secondary stroke prevention. Patient with resultant expressive and receptive aphasia, right hemiparesis (face, arm and leg), right field cut. Work up underway.  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   Hospital day # 2  TREATMENT/PLAN  Continue  aspirin for secondary stroke prevention. TEE  to look for embolic source.If positive for PFO (patent foramen ovale), check bilateral lower extremity venous dopplers to rule out DVT as possible source of stroke.  D2 thin liquid diet with pills crushed after TEE  Rehab consult  DR. Karena Kinker will look for MRI/MRA results done as an OP under [facility] as above  Post rehab, patient will likely need right ICA stenting  Long discussion with husband and daughter about her clinical presentation, diagnostic testing, plan of care and answered questions  SHARON BIBY, MSN, RN, ANVP-BC, ANP-BC, GNP-BC [facility] Pager: [phone] 10/18/2012 11:06 AM  I have personally obtained a history, examined the patient, evaluated imaging results, and formulated the assessment and plan of care. I agree with the above. Hildagarde Holleran, MD  

## 2012-10-18 NOTE — Progress Notes (Signed)
While cleaning patient up after incontinent episode, it was noted on the patient's bed pad the urine had a light pink tinge to it. When wiping patient, the wash rag was also pink tinged with two very tiny specks of what looked to be blood. Patient's perineal area inspected, and there was no evidence of injury where bleeding was occuring. Will continue to monitor this shift and pass along to day shift to have MD assess during rounds.

## 2012-10-18 NOTE — Interval H&P Note (Signed)
History and Physical Interval Note:  10/18/2012 3:21 PM  Tiffany [name]  has presented today for surgery, with the diagnosis of stroke  The various methods of treatment have been discussed with the patient and family. After consideration of risks, benefits and other options for treatment, the patient has consented to  Procedure(s): TRANSESOPHAGEAL ECHOCARDIOGRAM (TEE) (N/A) as a surgical intervention .  The patient's history has been reviewed, patient examined, no change in status, stable for surgery.  I have reviewed the patient's chart and labs.  Questions were answered to the patient's satisfaction.     Elyn Aquas.

## 2012-10-19 ENCOUNTER — Encounter (HOSPITAL_COMMUNITY): Payer: Self-pay | Admitting: Cardiovascular Disease

## 2012-10-19 MED ORDER — ATORVASTATIN CALCIUM 40 MG PO TABS
40.0000 mg | ORAL_TABLET | Freq: Every day | ORAL | Status: DC
  Administered 2012-10-19: 40 mg via ORAL
  Filled 2012-10-19 (×2): qty 1

## 2012-10-19 MED ORDER — CLOPIDOGREL BISULFATE 75 MG PO TABS
75.0000 mg | ORAL_TABLET | Freq: Every day | ORAL | Status: DC
  Administered 2012-10-20: 75 mg via ORAL
  Filled 2012-10-19: qty 1

## 2012-10-19 MED ORDER — NICOTINE 14 MG/24HR TD PT24
14.0000 mg | MEDICATED_PATCH | Freq: Every day | TRANSDERMAL | Status: DC
  Administered 2012-10-19 – 2012-10-20 (×2): 14 mg via TRANSDERMAL
  Filled 2012-10-19 (×2): qty 1

## 2012-10-19 MED ORDER — CLOPIDOGREL BISULFATE 75 MG PO TABS: 75.0000 mg | ORAL_TABLET | Freq: Every day | ORAL | Status: DC

## 2012-10-19 NOTE — H&P (Signed)
Physical Medicine and Rehabilitation Admission H&P    Chief Complaint  Patient presents with  . Difficulty speaking and right sided weakness    HPI: Tiffany [name] is a 62 y.o. RH- female with history of CAD, HTN, TIA event a month ago; who developed difficulty speaking with right sided weakness on am of 10/16/12. CCT with bilateral subcortical hypodensities and patient treated with TPA. Carotid dopplers with <80% right ICA stenosis. 2 D echo with EF 60-65% and grade one dysfunction. Around midnight on 04/29 patient with progression of symptoms with dense right hemiparesis with inability to follow commands. Follow up CCT with developing focus of low attenuation left insular cortex and deep left frontal white matter, no hemorrhage. MRI brain done revealing L-MCA territory infarct affecting insula, operculum, left motor strip, superior parietal lobe, and left splenium. MRA brain with LMCA occlusion in M1 segment with no distal flow signal. 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   TEE done showed no thrombus, ASD or PFO. Dr. Deveshwar was consulted for input on endovascular revascularization/stenting of R-ICA stenosis and patient/family have agreed to undergo procedure just prior to discharge from Rehab. MBS done revealing oral dysphagia with sensory motor deficits and patient placed on D2, thin liquids. Patient continues to be limited by aphasia with dysphagia as well as right sided weakness as well with inattention of RUE. Therapy working on pre-gait activities and recommended CIR for progressive therapies.    Review of Systems  Eyes: Positive for double vision (Can't see on the right. ).  Respiratory: Negative for shortness of breath.   Cardiovascular: Negative for chest pain and palpitations.  Gastrointestinal: Negative for heartburn, nausea, abdominal pain and constipation.  Genitourinary: Positive for urgency and frequency.  Neurological:  Positive for sensory change, speech change, focal weakness and headaches (Onset with TIA event a month ago. ).  Psychiatric/Behavioral: Positive for depression. The patient is nervous/anxious.    Past Medical History  Diagnosis Date  . Coronary artery disease   . Hypertension   . TIA (transient ischemic attack)   . High cholesterol   . MI (myocardial infarction)    Past Surgical History  Procedure Laterality Date  . Cardiac surgery    . Tee without cardioversion N/A 10/18/2012    Procedure: TRANSESOPHAGEAL ECHOCARDIOGRAM (TEE);  Surgeon: Philip J Nahser, MD;  Location: [facility];  Service: Cardiovascular;  Laterality: N/A;   History reviewed. No pertinent family history.  Social History:  Married--lives with husband. Works as a nurse. She reports that she has been smoking.  She does not have any smokeless tobacco history on file. She reports that she does not drink alcohol or use illicit drugs.    Allergies  Allergen Reactions  . Codeine Nausea Only   Medications Prior to Admission  Medication Sig Dispense Refill  . aspirin EC 325 MG tablet Take 325 mg by mouth daily.      . atorvastatin (LIPITOR) 40 MG tablet Take 40 mg by mouth daily.      . cetirizine (ZYRTEC) 10 MG tablet Take 10 mg by mouth daily.      . clonazePAM (KLONOPIN) 0.5 MG tablet Take 0.5 mg by mouth 3 (three) times daily as needed for anxiety.      . estrogen, conjugated,-medroxyprogesterone (PREMPRO) 0.625-2.5 MG per tablet Take 1 tablet by mouth daily.      . HYDROcodone-acetaminophen (VICODIN) 5-500 MG per tablet Take 1 tablet by mouth every 8 (eight) hours   as needed for pain.      . metoprolol (LOPRESSOR) 50 MG tablet Take 50 mg by mouth 2 (two) times daily.      . mometasone (NASONEX) 50 MCG/ACT nasal spray Place 2 sprays into the nose daily.      . nabumetone (RELAFEN) 750 MG tablet Take 750 mg by mouth 2 (two) times daily.      . venlafaxine (EFFEXOR) 25 MG tablet Take 25 mg by mouth 3 (three) times  daily.      . venlafaxine XR (EFFEXOR-XR) 150 MG 24 hr capsule Take 150 mg by mouth daily.        Home: Home Living Lives With:  (unknown at this time due to expressive aphasia) Additional Comments: Pt responds to yes not questions 6 out 7 correctly   Functional History: Prior Function Able to Take Stairs?: Yes (Could walk stairs prior to CVA on 03/10) Driving: Yes (Driving up until 03/10.  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.)  Functional Status:  Mobility: Bed Mobility Bed Mobility: Not assessed (pt up in chair) Supine to Sit: 2: Max assist;HOB elevated Sitting - Scoot to Edge of Bed: 2: Max assist Sit to Supine: 2: Max assist;HOB elevated Transfers Transfers: Sit to Stand;Stand to Sit Sit to Stand: 1: +2 Total assist;From chair/3-in-1;With armrests;With upper extremity assist Sit to Stand: Patient Percentage: 70% Stand to Sit: 1: +2 Total assist;With upper extremity assist;To chair/3-in-1 Stand to Sit: Patient Percentage: 70% Stand Pivot Transfers: 1: +2 Total assist Stand Pivot Transfers: Patient Percentage: 40% Ambulation/Gait Ambulation/Gait Assistance: Not tested (comment) Stairs: No    ADL: ADL Eating/Feeding: NPO Grooming: Performed;Wash/dry face;Maximal assistance Where Assessed - Grooming: Supported sitting Toilet Transfer: Simulated;+2 Total assistance Toilet Transfer Method: Sit to stand Toilet Transfer Equipment: Other (comment) (recliner) Equipment Used: Gait belt Transfers/Ambulation Related to ADLs: Pt performed multiple sit<>stands from chair at countertop in room.  Min-mod manual assist to R knee for stability esp during weight shifting.   ADL Comments: Focus on sitting balance EOC and standing balance. Pt requiring multimodal cues for anterior lean which sitting EOC as precursor for power up.  Pt demonstrating neglect of RUE. Brunstrom level 2 in RUE.  Pt with RUE tone and at  times drawing RUE into flexor position with internal rotation (nonvolunatry).  No active movement noted in hand.   Cognition: Cognition Overall Cognitive Status: Impaired/Different from baseline Arousal/Alertness: Awake/alert Orientation Level: Oriented to person Attention: Focused;Sustained;Selective Focused Attention: Appears intact Sustained Attention: Appears intact Selective Attention: Impaired Selective Attention Impairment: Functional basic Memory:  (UTA) Awareness: Impaired Awareness Impairment: Intellectual impairment;Emergent impairment Problem Solving: Impaired Problem Solving Impairment: Functional basic Executive Function: Initiating Initiating: Impaired Initiating Impairment: Verbal basic;Functional basic Behaviors: Restless Cognition Arousal/Alertness: Awake/alert Behavior During Therapy: WFL for tasks assessed/performed Overall Cognitive Status: Impaired/Different from baseline Area of Impairment: Attention;Following commands;Safety/judgement Current Attention Level: Sustained Following Commands: Follows one step commands with increased time;Follows one step commands inconsistently Difficult to assess due to: Impaired communication  Physical Exam: Blood pressure 143/63, pulse 87, temperature 98.7 F (37.1 C), temperature source Oral, resp. rate 20, height 5' 4" (1.626 m), weight 86.1 kg (189 lb 13.1 oz), SpO2 100.00%.  Physical Exam  Nursing note and vitals reviewed. Constitutional: She is oriented to person, place, and time. She appears well-developed and well-nourished. No distress.  HENT:  Head: Normocephalic and atraumatic.  Right Ear: External ear normal.  Left Ear: External ear normal.  Eyes: Conjunctivae and   EOM are normal. Pupils are equal, round, and reactive to light.  Neck: Normal range of motion. No JVD present. No tracheal deviation present. No thyromegaly present.  Cardiovascular: Normal rate and regular rhythm.  Exam reveals friction rub.    No murmur heard. Pulmonary/Chest: Effort normal and breath sounds normal. No respiratory distress. She has no wheezes. She has no rales.  Abdominal: Soft. Bowel sounds are normal. She exhibits no distension. There is no tenderness.  Musculoskeletal: Normal range of motion.  Lymphadenopathy:    She has no cervical adenopathy.  Neurological: She is alert and oriented to person, place, and time.  Alert. Right facial weakness and tongue deviation with  Increased oral secretions. Dysarthric, ataxic speech with expressive difficulty. Oriented X 3. Able to give biographic information with occasional phenomic cues.  Apraxia noted. Needs occasional visual cues to follow one step commands.  Right field cut. Right hemiparesis: RUE trace biceps, pec, 0/5 elsewhere. RLE is tr to 1/5 HF and KF, and absent elswehere. Sensation 1/2 throughout the right side.   Skin: Skin is warm and dry.  Psychiatric: Thought content normal. Her mood appears anxious. Her affect is labile.    No results found for this or any previous visit (from the past 48 hour(s)).   Post Admission Physician Evaluation: 1. Functional deficits secondary  to embolic left MCA infarct. 2. Patient is admitted to receive collaborative, interdisciplinary care between the physiatrist, rehab nursing staff, and therapy team. 3. Patient's level of medical complexity and substantial therapy needs in context of that medical necessity cannot be provided at a lesser intensity of care such as a SNF. 4. Patient has experienced substantial functional loss from his/her baseline which was documented above under the "Functional History" and "Functional Status" headings.  Judging by the patient's diagnosis, physical exam, and functional history, the patient has potential for functional progress which will result in measurable gains while on inpatient rehab.  These gains will be of substantial and practical use upon discharge  in facilitating mobility and self-care  at the household level. 5. Physiatrist will provide 24 hour management of medical needs as well as oversight of the therapy plan/treatment and provide guidance as appropriate regarding the interaction of the two. 6. 24 hour rehab nursing will assist with bladder management, bowel management, safety, skin/wound care, disease management, medication administration, pain management and patient education  and help integrate therapy concepts, techniques,education, etc. 7. PT will assess and treat for/with: Lower extremity strength, range of motion, stamina, balance, functional mobility, safety, adaptive techniques and equipment, NMR, education.   Goals are: supervision to minimal assist. 8. OT will assess and treat for/with: ADL's, functional mobility, safety, upper extremity strength, adaptive techniques and equipment, NMR, CPT, education.   Goals are: supervision to minimal assist. 9. SLP will assess and treat for/with: speech, language, communication, cognition.  Goals are: supervision to minimal assist. 10. Case Management and Social Worker will assess and treat for psychological issues and discharge planning. 11. Team conference will be held weekly to assess progress toward goals and to determine barriers to discharge. 12. Patient will receive at least 3 hours of therapy per day at least 5 days per week. 13. ELOS: 3 weeks      Prognosis:  good   Medical Problem List and Plan: 1. DVT Prophylaxis/Anticoagulation: Pharmaceutical: Lovenox 2. Pain Management:  Will resume norco prn for headaches.  3. Depression:  Occasionally crying/anxious during exam. Will resume Effexor and klonopin as at home.  Team to continue to provide   Ego support and encouragement.  Will have LCSW and psychologist follow for support.  4. Neuropsych: This patient is  capable of making decisions on her own behalf. 5. HTN: will monitor with bid checks. Continue labetalol. Titrate medications as needed for better control.  6.  Dyslipidemia: Continue Lipitor.  7. CAD: Will continue lipitor and ASA. Monitor for symptoms with activity.  8. Dysphagia: Needs full supervision at meals. Will continue Modified diet per ST recommendations.   Ivylynn Hoppes T. Bhavik Cabiness, MD, FAAPMR   10/19/2012 

## 2012-10-19 NOTE — Progress Notes (Signed)
Rehab admissions - Evaluated for possible admission.  I met with husband and daughter.  They would like inpatient rehab prior to home with husband.  I will call and fax information to state [employer] requesting inpatient rehab admission.  Will await response from insurance carrier.  Call me for questions.  #[phone]

## 2012-10-19 NOTE — Progress Notes (Signed)
Stroke Team Progress Note  HISTORY Tiffany [name] is an 62 y.o. female with a past medical history significant for hypertension, hypercholesterolemia, CAD s/p MI and s/p stenting, TIA last year, brought to [facility] by medics as a code stroke due to acute onset right hemiparesis, aphasia, and right face droopiness. Patient's husband stated that Tiffany [name] woke up at 7 am 10/16/2012 feeling well, had breakfast, and around 7:50 developed weakness of the right side of her body and couldn't talk. EMS was called and when they arrived to the scene she was not able to follow commands, aphasic with right hemiparesis and right face weakness. Upon arrival to ED she scored 13 in the NIHSS and had an emergent CT brain that showed a subtle area of deceased attenuation in the left insula and left operculum. Patient is alert but aphasic. Patient was a TPA candidate. She was admitted to the neuro ICU for further evaluation and treatment.  SUBJECTIVE Her daughter is at the bedside. Therapy is working with patient.   Again today, 10/19/2012, daughter states:  on August 28, 2012 patient experienced right sided weakness, seen by MD and diagnosed with back problems. She did therapy without improvement. She was seen to a neurologist, Dr. Lendell Caprice in [place], who ordered an MRI which showed a "small stroke" and suggested carotid occluion. Based on MRI report,  MRA was ordered MRA was done last week at [facility]. Per daughter, Dr. Lendell Caprice never recevied results.   Dr Pearlean Brownie searched for MRI/ MRA results under Tiffany [name] and was able to review report.  OBJECTIVE Most recent Vital Signs: Filed Vitals:   10/18/12 1815 10/19/12 0200 10/19/12 0600 10/19/12 0950  BP: 146/58 150/63 115/59 152/69  Pulse: 94 88 94 84  Temp: 99 F (37.2 C) 98.7 F (37.1 C) 98.9 F (37.2 C) 98.1 F (36.7 C)  TempSrc: Oral Oral Oral Oral  Resp: 16 18 20 20   Height:      Weight:      SpO2: 96% 95% 94% 98%   CBG (last 3)   Recent  Labs  10/16/12 1604  GLUCAP 101*    IV Fluid Intake:   . sodium chloride 75 mL/hr at 10/19/12 0200    MEDICATIONS  . antiseptic oral rinse  15 mL Mouth Rinse BID  . aspirin  300 mg Rectal Daily   Or  . aspirin  325 mg Oral Daily  . enoxaparin (LOVENOX) injection  40 mg Subcutaneous Q24H  . pantoprazole (PROTONIX) IV  40 mg Intravenous QHS   PRN:  acetaminophen, labetalol, ondansetron (ZOFRAN) IV, senna-docusate  Diet:  Dysphagia 3 thin liquids Activity:  Bedrest DVT Prophylaxis:  SCDs   CLINICALLY SIGNIFICANT STUDIES Basic Metabolic Panel:   Recent Labs Lab 10/16/12 1000 10/16/12 1010  NA 138 139  K 4.3 4.2  CL 101 105  CO2 25  --   GLUCOSE 116* 115*  BUN 10 9  CREATININE 0.83 0.90  CALCIUM 9.7  --    Liver Function Tests:   Recent Labs Lab 10/16/12 1000  AST 16  ALT 10  ALKPHOS 104  BILITOT 0.3  PROT 7.1  ALBUMIN 3.8   CBC:   Recent Labs Lab 10/16/12 1000 10/16/12 1010  WBC 11.8*  --   NEUTROABS 6.6  --   HGB 15.1* 15.6*  HCT 42.6 46.0  MCV 88.6  --   PLT 241  --    Coagulation:   Recent Labs Lab 10/16/12 1000  LABPROT 12.7  INR 0.96  Cardiac Enzymes:   Recent Labs Lab 10/16/12 1000  TROPONINI <0.30   Urinalysis:   Recent Labs Lab 10/16/12 1519  COLORURINE YELLOW  LABSPEC 1.013  PHURINE 5.5  GLUCOSEU NEGATIVE  HGBUR NEGATIVE  BILIRUBINUR NEGATIVE  KETONESUR NEGATIVE  PROTEINUR NEGATIVE  UROBILINOGEN 0.2  NITRITE NEGATIVE  LEUKOCYTESUR NEGATIVE   Lipid Panel     Component Value Date/Time   CHOL 170 10/17/2012 0706   TRIG 132 10/17/2012 0706   HDL 36* 10/17/2012 0706   CHOLHDL 4.7 10/17/2012 0706   VLDL 26 10/17/2012 0706   LDLCALC 108* 10/17/2012 0706   HgbA1C  Lab Results  Component Value Date   HGBA1C 5.8* 10/17/2012   Urine Drug Screen:     Component Value Date/Time   LABOPIA NONE DETECTED 10/16/2012 1519   COCAINSCRNUR NONE DETECTED 10/16/2012 1519   LABBENZ NONE DETECTED 10/16/2012 1519   AMPHETMU NONE  DETECTED 10/16/2012 1519   THCU NONE DETECTED 10/16/2012 1519   LABBARB NONE DETECTED 10/16/2012 1519    Alcohol Level:   Recent Labs Lab 10/16/12 1000  ETH <11   CT of the brain   10/17/2012   Developing focus of low attenuation change with indistinct gray- white junctions in the left insular cortex and anterior deep white matter are consistent with early changes of acute infarct.  No acute intracranial hemorrhage.    10/16/2012  No acute abnormality.    10/16/2012   Cannot exclude early cytotoxic edema in the insula.  Bilateral subcortical white matter hypodensity noted, including at the left operculum.  No acute intracranial hemorrhage.  No suspicious intracranial vascular hyperdensity.    MRI of the brain  10/17/2012   1.  Left MCA territory acute infarct.  Insula and operculum most affected.  Also patchy involvement of the left motor strip, superior parietal lobe, and left splenium. 2.  Mild cytotoxic edema without mass effect at this time.  No associated intracranial hemorrhage. 3.  See MRA findings below.  Evidence of decreased flow in the left ICA at the skull base and in the posterior left MCA branches.  4.  Underlying moderate cerebral white matter signal changes most suggestive of chronic white matter small vessel ischemia.    MRA of the brain  10/17/2012  1.  Left MCA occluded in the M1 segment with no distal flow signal detected. 2.  Little to no flow in the left distal cervical ICA and majority of the left ICA siphon.  Reconstituted flow signal the left ICA terminus likely from collaterals. 3.  Motion degraded, but the remaining anterior circulation appears within normal limits. 4.  Negative posterior circulation.    2D Echocardiogram  EF 60-65% with no source of embolus.   TEE: no PFO or ASD  Carotid Doppler  Left ICA occluded and <80% right ICA stenosis.  CXR  10/16/2012  Slight bronchitic changes.    EKG  normal sinus rhythm.   Therapy Recommendations CIR  Physical Exam   Pleasant elderly caucasian [name] not in distress.Awake alert. Afebrile. Head is nontraumatic. Neck is supple without bruit.  Cardiac exam no murmur or gallop. Lungs are clear to auscultation. Distal pulses are well felt. Neurological Exam : Awake and alert significant expressive aphasia and speaks only a few words with dysarthria. Unable to speak sentences, name, repeat a right. Can follow simple midline commands only. Left gaze preference but can look to the right past midline. Does not blink to threat on the right but does so on the left. Severe right  lower facial weakness. Tongue is midline. Cough and gag a week. Dense right upper extremity plegia with 0/5 strength with minimum withdrawal to painful stimuli. Mild right lower extremity drift with 3/5 strength. Right plantar is upgoing. There appears diminished sensation on the right. Normal strength on the left.  ASSESSMENT Tiffany [name] is a 62 y.o. female presenting with acute onset right hemiparesis, aphasia, and right face droopiness. Patient status post IV t-PA 10/16/2012 at 1038. Initial maging suggests a left insular & frontal cortex infarct. MRI pending. Mild neuro worsening overnight with NIHSS increased 2 pts; CT unrevealing for acute abnormality. Source of infarct unknown at this time.  No antiplatelets documented prior to admission. Now on aspirin for secondary stroke prevention. Patient with resultant expressive and receptive aphasia, right hemiparesis (face, arm and leg), right field cut. Work up underway.  Hypertension Hyperlipidemia, LDL 108, not on statin PTA, now on statin now, goal LDL < 100 CAD - MI Hx TIA L ICA occlusion R > 80% ICA stenosis HgbA1c 6.8 Obesity, Body mass index is 32.57 kg/(m^2).   Hospital day # 3  TREATMENT/PLAN  Continue  aspirin for secondary stroke prevention. After discussion with Dr. Corliss Skains, Plavix also added due to RICA stenosis, see documented note from 10/19/2012: stenting planned just  prior to leaving rehab to go home  D2 thin liquid diet with pills crushed after TEE  CIR. OOB  Add Statin.  Long discussion with husband and daughter about her clinical presentation, diagnostic testing, plan of care and answered questions  D/W daughter and patient need for elective right ICA revascularization after inpatient rehab stay. Will consult Dr Corliss Skains interventional neuroradiology for RICA PTA/stenting in 2-3 weeks  Gwendolyn Lima. Manson Passey, [facility], MBA, MHA [facility] Pager: [phone] 10/19/2012 3:10 PM  I have personally obtained a history, examined the patient, evaluated imaging results, and formulated the assessment and plan of care. I agree with the above.  Delia Heady, MD

## 2012-10-19 NOTE — Progress Notes (Signed)
Occupational Therapy Treatment Patient Details Name: Tiffany [name] MRN: [id number] DOB: 06/18/1951 Today's Date: 10/19/2012 Time: 1640-1701 OT Time Calculation (min): 21 min  OT Assessment / Plan / Recommendation Comments on Treatment Session Focus of todays session on RUE PROM and increasing attention to right side. Continue to recommend CIR.    Follow Up Recommendations  CIR    Barriers to Discharge       Equipment Recommendations  3 in 1 bedside comode;Wheelchair (measurements OT);Wheelchair cushion (measurements OT)    Recommendations for Other Services Rehab consult  Frequency Min 3X/week   Plan Discharge plan remains appropriate    Precautions / Restrictions Precautions Precautions: Fall Restrictions Weight Bearing Restrictions: No   Pertinent Vitals/Pain See vitals    ADL  Grooming: Performed;Brushing hair;Minimal assistance (used LUE) Where Assessed - Grooming: Supported sitting ADL Comments: Focus of session on RUE PROM. Pt demonstrating improved attention to right side but still requiring mod verbal cues at times for right side attention.  Pt able to locate 2/3 objects on right side of table with min verbal cueing for scanning.  Pt initiating questions today and able to respond to some questions with mod cueing for word finding. Educated pt on performing self PROM for right digits, and pt able to return demonstration.  Pt sat EOC and performed 10 table slides (left hand over right hand with therapist supporting RUE proximally)  demo'ing right shoulder PROM and attention to RUE.     OT Diagnosis:    OT Problem List:   OT Treatment Interventions:     OT Goals ADL Goals Pt Will Perform Grooming: with min assist;Sitting, chair;Supported ADL Goal: Grooming - Progress: Progressing toward goals Miscellaneous OT Goals Miscellaneous OT Goal #1: Pt will locate 2 out 3 objects on right visual field during adl task without cues OT Goal: Miscellaneous Goal #1 - Progress:  Progressing toward goals Miscellaneous OT Goal #3: Pt will complete 2 step command x3 during session  OT Goal: Miscellaneous Goal #3 - Progress: Progressing toward goals  Visit Information  Last OT Received On: 10/19/12 Assistance Needed: +2    Subjective Data      Prior Functioning       Cognition  Cognition Arousal/Alertness: Awake/alert Behavior During Therapy: WFL for tasks assessed/performed Overall Cognitive Status: Impaired/Different from baseline Area of Impairment: Attention;Following commands;Safety/judgement Current Attention Level: Sustained Following Commands: Follows one step commands with increased time;Follows one step commands inconsistently Difficult to assess due to: Impaired communication    Mobility     Exercises  General Exercises - Upper Extremity Shoulder Flexion: PROM;Right;20 reps;Seated Shoulder Extension: PROM;Right;20 reps;Seated Shoulder ABduction: PROM;Right;20 reps;Seated Shoulder ADduction: PROM;20 reps;Seated Elbow Flexion: PROM;Right;20 reps;Seated Elbow Extension: PROM;Right;20 reps;Seated Wrist Flexion: PROM;Right;20 reps;Seated Wrist Extension: PROM;Right;20 reps;Seated Digit Composite Flexion: PROM;Right;20 reps;Seated Composite Extension: PROM;Right;15 reps;Seated   Balance    End of Session OT - End of Session Activity Tolerance: Patient tolerated treatment well Patient left: in chair;with call bell/phone within reach  GO    10/19/2012 Cipriano Mile OTR/L Pager [phone] Office [phone]  Cipriano Mile 10/19/2012, 6:33 PM

## 2012-10-19 NOTE — Progress Notes (Signed)
Speech Language Pathology Treatment Patient Details Name: Tiffany [name] MRN: [id number] DOB: [birth year] Today's Date: 10/19/2012 Time: 2130-8657 SLP Time Calculation (min): 50 min  Assessment / Plan / Recommendation Clinical Impression  SLP offered skilled treatment to facilitate functional communciation. Pt able to repeat words and phrase with inially max verbal cues, fading to min phonemic cues at end of session. Provided written models which pt was able to read with effort. Max cues for pt to commnicate using gestures, apraxia impacting ability to point and gesture effectively. ENdo f session pt able to initiate questions regardin plan of care iwth moderate verbal cues to complete ideasTolerating diet well with no evidence of aspiration. SLP will continue efforts, pt making good progress, continue to recommend CIR.     SLP Plan  Continue with current plan of care    Pertinent Vitals/Pain NA  SLP Goals  SLP Goals Potential to Achieve Goals: Good SLP Goal #1: Pt will verbalize in automatic speech tasks with max verbal cues x3 in session SLP Goal #1 - Progress: Progressing toward goal SLP Goal #2: Pt will express wants/needs with gestures/verbalizations with max contextual and verbal cues x3 in session SLP Goal #2 - Progress: Progressing toward goal SLP Goal #3: Pt will follow one step commands with 80% accuracy and moderate multimodal cues.  SLP Goal #3 - Progress: Progressing toward goal  General Temperature Spikes Noted: No Respiratory Status: Room air Behavior/Cognition: Alert;Cooperative;Pleasant mood Oral Cavity - Dentition: Adequate natural dentition Patient Positioning: Upright in chair  Oral Cavity - Oral Hygiene Does patient have any of the following "at risk" factors?: Other - dysphagia   Treatment Treatment focused on: Aphasia;Cognition (dysphagia)   GO     Shakia Sebastiano, Riley Nearing 10/19/2012, 4:31 PM

## 2012-10-19 NOTE — Progress Notes (Signed)
Patient ID: Tiffany [name], female   DOB: [birth year], 62 y.o.   MRN: [id number]                                                                                                                                                                           Patient asked to be seen by Dr Pearlean Brownie  To evaluate for endovascular revascularization of symptomatic  Prox.  Rt ICA  Stenosis of > 80 %. Patient has an occluded LT ICA prox with a recent LT  Cerebral hemisperic ischemic stroke resulting in aphasia and Rt sided hemiparesis A>L. Presently on aspirin 325mg  per day and lovenox.[place]. Findings of  the recent imaging studies were  reviewed extensively with the  patient,husband and daughter.. The procedure,reasons,risks ,alternatives were discussed in detail. Patient and fafily are in agreement to proceed with stent assisted angioplasty  With distal protection. This will be scheduled just prior to patients D/C from rehab in 10 12 days time. Meanwhile would add plavix 75 mg per day to patients antiplatelet  regemin inaddition to 325 mg aspirin per day. Discussed with Dr Pearlean Brownie.

## 2012-10-19 NOTE — PMR Pre-admission (Signed)
PMR Admission Coordinator Pre-Admission Assessment  Patient: Tiffany [name] is an 62 y.o., female MRN: [id number] DOB: [birth year] Height: 5\' 4"  (162.6 cm) Weight: 86.1 kg (189 lb 13.1 oz)              Insurance Information HMO:      PPO: Yes     PCP:       IPA:       80/20:       OTHER:  Group [id number] PRIMARY: BCBS state health plan      Policy#: [id number]      Subscriber: [name] CM Name: Lorraine Lax      Phone#: [phone]     Fax#: [phone] Pre-Cert#: [id number] Update after team conference/within 7 days      Employer:  [employer] Benefits:  Phone #: [phone]     Name: Nat Christen. Date: 06/21/12     Deduct: $700(met)      Out of Pocket Max: $3210(met $770.38)      Life Max: unlimited CIR: 80% after $233 per admit      SNF: 80% w/auth  100 days Outpatient: No limits     Co-Pay: $52/visit Home Health: 80%      Co-Pay: 20% DME: 80%     Co-Pay: 20% Providers: in network  Emergency Contact Information Contact Information   Name Relation Home Work Mobile   [place] Spouse [phone]  [phone]   Weyman Pedro Daughter [phone]  [phone]     Current Medical History  Patient Admitting Diagnosis: L MCA infarct   History of Present Illness: A 62 y.o. female with history of CAD, HTN; who developed difficulty speaking with right sided weakness on am of 10/16/12. CCT with bilateral subcortical hypodensities and patient treated with TPA. Carotid dopplers with <80% right ICA stenosis. 2 D echo with EF 60-65% and grade one dysfunction. Around midnight on 04/29 patient with progression of symptoms with dense right hemiparesis with inability to follow commands. Follow up CCT with developing focus of low attenuation left insular cortex and deep left frontal white matter, no hemorrhage. MRI brain done revealing Work up ongoing with therapy evaluations to be done today. MD recommending CIR. L-MCA territory infarct affecting insula, operculum, left motor strip, superior  parietal lobe, and left splenium. MRA brain with LMCA occlusion in M1 segment with no distal flow signal. Neurology feels patient with embolic CVA and TEE ordered for further work up. 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 She follows commands with 75% accuracy. Yes/ No 75% accuracy.   MD, PT, OT, ST recommending CIR.      Total: 12=NIH  Past Medical History  Past Medical History  Diagnosis Date  . Coronary artery disease   . Hypertension   . TIA (transient ischemic attack)   . High cholesterol   . MI (myocardial infarction)     Family History  family history is not on file.  Prior Rehab/Hospitalizations: No previous rehab admissions.   Current Medications  Current facility-administered medications:0.9 %  sodium chloride infusion, , Intravenous, Continuous, Wyatt Portela, MD, Last Rate: 75 mL/hr at 10/19/12 0200;  acetaminophen (TYLENOL) tablet 650 mg, 650 mg, Oral, Q6H PRN, Thana Farr, MD, 650 mg at 10/20/12 0957;  antiseptic oral rinse (BIOTENE) solution 15 mL, 15 mL, Mouth Rinse, BID, Micki Riley, MD, 15 mL at 10/20/12 1001 aspirin suppository 300 mg, 300  mg, Rectal, Daily, Layne Benton, NP, 300 mg at 10/18/12 1323;  aspirin tablet 325 mg, 325 mg, Oral, Daily, Layne Benton, NP, 325 mg at 10/20/12 1001;  atorvastatin (LIPITOR) tablet 40 mg, 40 mg, Oral, q1800, Cathlyn Parsons, PA-C, 40 mg at 10/19/12 2027;  clopidogrel (PLAVIX) tablet 75 mg, 75 mg, Oral, Q breakfast, D Kevin Allred, PA-C, 75 mg at 10/20/12 1000 enoxaparin (LOVENOX) injection 40 mg, 40 mg, Subcutaneous, Q24H, Layne Benton, NP, 40 mg at 10/19/12 1400;  labetalol (NORMODYNE,TRANDATE) injection 10 mg, 10 mg, Intravenous, Q10 min PRN, Wyatt Portela, MD;  nicotine (NICODERM CQ - dosed in mg/24 hours) patch 14 mg, 14 mg,  Transdermal, Daily, Cathlyn Parsons, PA-C, 14 mg at 10/20/12 1001;  ondansetron (ZOFRAN) injection 4 mg, 4 mg, Intravenous, Q6H PRN, Wyatt Portela, MD pantoprazole (PROTONIX) injection 40 mg, 40 mg, Intravenous, QHS, Wyatt Portela, MD, 40 mg at 10/19/12 2349;  senna-docusate (Senokot-S) tablet 1 tablet, 1 tablet, Oral, QHS PRN, Wyatt Portela, MD  Patients Current Diet: Dysphagia  Precautions / Restrictions Precautions Precautions: Fall Precaution Comments: no pushing noted Restrictions Weight Bearing Restrictions: No   Prior Activity Level Community (5-7x/wk): Went out 3-4 X a week prior to 08/28/12  Home Assistive Devices / Equipment Home Assistive Devices/Equipment: Dan Humphreys (specify type)  Prior Functional Level Prior Function Level of Independence: Independent (Has declined since 3/10 and using a walker recently.) Able to Take Stairs?: Yes (Could walk stairs prior to CVA on 03/10) Driving: Yes (Driving up until [age].  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, site.)  Current Functional Level Cognition  Arousal/Alertness: Awake/alert Overall Cognitive Status: Impaired/Different from baseline Difficult to assess due to: Impaired communication Current Attention Level: Sustained Orientation Level: Oriented to person Following Commands: Follows one step commands with increased time;Follows one step commands inconsistently Attention: Focused;Sustained;Selective Focused Attention: Appears intact Sustained Attention: Appears intact Selective Attention: Impaired Selective Attention Impairment: Functional basic Memory:  (UTA) Awareness: Impaired Awareness Impairment: Intellectual impairment;Emergent impairment Problem Solving: Impaired Problem Solving Impairment: Functional basic Executive Function: Initiating Initiating: Impaired Initiating Impairment: Verbal basic;Functional basic Behaviors: Restless    Extremity  Assessment (includes Sensation/Coordination)  RUE ROM/Strength/Tone: Deficits;Due to impaired cognition RUE ROM/Strength/Tone Deficits: pt with tone in Right UE and no active movement noted at this time RUE Sensation: Deficits RUE Coordination: Deficits RUE Coordination Deficits: abscent gross grasp at this time Brun[place] I  RLE ROM/Strength/Tone: Deficits;Unable to fully assess;Due to impaired cognition RLE ROM/Strength/Tone Deficits: lifts antigravity, AROM WFL, strength grossly 3+/5    ADLs  Eating/Feeding: NPO Grooming: Performed;Brushing hair;Minimal assistance (used LUE) Where Assessed - Grooming: Supported sitting Toilet Transfer: Chief of Staff: Patient Percentage: 70% Statistician Method: Sit to Barista: Other (comment) (recliner) Equipment Used: Gait belt Transfers/Ambulation Related to ADLs: Pt performed multiple sit<>stands from chair at countertop in room.  Min-mod manual assist to R knee for stability esp during weight shifting.   ADL Comments: Focus of session on RUE PROM. Pt demonstrating improved attention to right side but still requiring mod verbal cues at times for right side attention.  Pt able to locate 2/3 objects on right side of table with min verbal cueing for scanning.  Pt initiating questions today and able to respond to some questions with mod cueing for word finding. Educated pt on performing self PROM for right digits, and pt able to return demonstration.  Pt sat EOC and placed left hand over right  with hands on washcloth and pushed washcloth back and forth on table (therapist supporting RUE proximally), demo'ing right shoulder PROM and attention to RUE.     Mobility  Bed Mobility: Not assessed Supine to Sit: 2: Max assist;HOB elevated Sitting - Scoot to Edge of Bed: 2: Max assist Sit to Supine: 2: Max assist;HOB elevated    Transfers  Transfers: Sit to Stand;Stand to Sit Sit to Stand: 1: +2 Total  assist;With upper extremity assist;From bed;From chair/3-in-1 Sit to Stand: Patient Percentage: 70% Stand to Sit: 1: +2 Total assist;With upper extremity assist;To chair/3-in-1 Stand to Sit: Patient Percentage: 70% Stand Pivot Transfers: 1: +2 Total assist Stand Pivot Transfers: Patient Percentage: 40%    Ambulation / Gait / Stairs / Wheelchair Mobility  Ambulation/Gait Ambulation/Gait Assistance: 1: +2 Total assist Ambulation/Gait: Patient Percentage: 20% Ambulation Distance (Feet): 7 Feet (times 2) Assistive device: Other (Comment);None (rolling table) Ambulation/Gait Assistance Details: needed assist fro w/shift, advancing R LE, truncal stability and translation forward. Gait Pattern: Step-to pattern;Lateral trunk lean to right;Narrow base of support;Right foot flat (help to advance R LE once unweighted) Stairs: No    Posture / Balance Static Sitting Balance Static Sitting - Balance Support: Feet supported;Left upper extremity supported Static Sitting - Level of Assistance: 5: Stand by assistance Static Sitting - Comment/# of Minutes: min guard EOC with multimodal cues. Pt with improved balance and less cueing toward end of session. Dynamic Sitting Balance Dynamic Sitting - Balance Support: Feet unsupported;During functional activity;Left upper extremity supported;Right upper extremity supported Dynamic Sitting - Level of Assistance: 4: Min assist Static Standing Balance Static Standing - Balance Support: During functional activity;Bilateral upper extremity supported Static Standing - Level of Assistance: Other (comment);3: Mod assist (min guard once set up ) Static Standing - Comment/# of Minutes: up to 25 secs statically    Special needs/care consideration BiPAP/CPAP No CPM No Continuous Drip IV 0.9% NS 62ml/hr Dialysis No        Life Vest No Oxygen No Special Bed No Trach Size No Wound Vac (area) No      Skin No                               Bowel mgmt: Incontinent BM  10/19/12 Bladder mgmt: Incontinent urnine 10/19/12 Diabetic mgmt No    Previous Home Environment Living Arrangements: Spouse/significant other Lives With: Spouse Available Help at Discharge: Family Type of Home: Mobile home (Double wide mobile home.) Home Layout: One level Home Access: Stairs to enter Secretary/administrator of Steps: 5-8 Home Care Services: No Additional Comments: Pt responds to yes not questions 6 out 7 correctly  Discharge Living Setting Plans for Discharge Living Setting: Patient's home;Lives with (comment);Mobile Home (Lives with husband.) Type of Home at Discharge: Mobile home (Lives in a double wide mobile home.) Discharge Home Layout: One level Discharge Home Access: Stairs to enter Entrance Stairs-Number of Steps: 8 at front entry and 5 at back with 1 step up into home. (Family talking about building a ramp as needed.) Do you have any problems obtaining your medications?: No  Social/Family/Support Systems Patient Roles: Spouse;Parent (Has a son and a daughter.) Contact Information: Brielyn Bosak - husband and Weyman Pedro - daughter Anticipated Caregiver: Husband and daughter Anticipated Caregiver's Contact Information: See emergency contacts. Ability/Limitations of Caregiver: Husband is retired and can assist.  Dtr lives closeby, not working, on disability for back and neck, going to school. Caregiver Availability: 24/7 Discharge Plan  Discussed with Primary Caregiver: Yes Is Caregiver In Agreement with Plan?: Yes Does Caregiver/Family have Issues with Lodging/Transportation while Pt is in Rehab?: No  Goals/Additional Needs Patient/Family Goal for Rehab: PT/OT S/min A, ST S goals Expected length of stay: 3 weeks Cultural Considerations: None Dietary Needs: Dys 2, thin liquids Equipment Needs: TBD Pt/Family Agrees to Admission and willing to participate: Yes Program Orientation Provided & Reviewed with Pt/Caregiver Including Roles  &  Responsibilities: Yes (Spoke with husband and daughter.)   Decrease burden of Care through IP rehab admission:  Not applicable  Possible need for SNF placement upon discharge:  Not likely.  Family is very hands-on and would like to care for patient at home after rehab.   Patient Condition: This patient's condition remains as documented in the consult dated 10/18/12, in which the Rehabilitation Physician determined and documented that the patient's condition is appropriate for intensive rehabilitative care in an inpatient rehabilitation facility. Will admit to inpatient rehab today.  Preadmission Screen Completed By:  Trish Mage, 10/20/2012 1:30 PM ______________________________________________________________________   Discussed status with Dr. Riley Kill on 10/20/12 at 1113 and received telephone approval for admission today.  Admission Coordinator:  Trish Mage, time1330/Date05/02/14

## 2012-10-19 NOTE — Progress Notes (Signed)
Physical Therapy Treatment Patient Details Name: Tiffany [name] MRN: [id number] DOB: 10/26/1950 Today's Date: 10/19/2012 Time: 1308-6578 PT Time Calculation (min): 40 min  PT Assessment / Plan / Recommendation Comments on Treatment Session  Emphasized scooting, sit to stand standing balance, pregait and initiated gait.Randie Heinz rehab candidat    Follow Up Recommendations  CIR     Does the patient have the potential to tolerate intense rehabilitation     Barriers to Discharge        Equipment Recommendations  Other (comment)    Recommendations for Other Services Rehab consult  Frequency Min 4X/week   Plan Discharge plan remains appropriate;Frequency remains appropriate    Precautions / Restrictions Precautions Precautions: Fall Restrictions Weight Bearing Restrictions: No   Pertinent Vitals/Pain     Mobility  Bed Mobility Bed Mobility: Not assessed Transfers Transfers: Sit to Stand;Stand to Sit Sit to Stand: 1: +2 Total assist;With upper extremity assist;From bed;From chair/3-in-1 Sit to Stand: Patient Percentage: 70% Stand to Sit: 1: +2 Total assist;With upper extremity assist;To chair/3-in-1 Stand to Sit: Patient Percentage: 70% Details for Transfer Assistance: majority of the treatment centered around transfers sit to standing.  incl set up. and "normal" symmetrical transitions.  pt. still needed stability assist and help coming forward. Ambulation/Gait Ambulation/Gait Assistance: 1: +2 Total assist Ambulation/Gait: Patient Percentage: 20% Ambulation Distance (Feet): 7 Feet (times 2) Assistive device: Other (Comment);None (rolling table) Ambulation/Gait Assistance Details: needed assist fro w/shift, advancing R LE, truncal stability and translation forward. Gait Pattern: Step-to pattern;Lateral trunk lean to right;Narrow base of support;Right foot flat (help to advance R LE once unweighted) Stairs: No Modified Rankin (Stroke Patients Only) Modified Rankin: Severe  disability    Exercises     PT Diagnosis:    PT Problem List:   PT Treatment Interventions:     PT Goals Acute Rehab PT Goals Time For Goal Achievement: 10/31/12 Potential to Achieve Goals: Good Pt will Roll Supine to Right Side: with min assist;with rail PT Goal: Rolling Supine to Right Side - Progress: Not met Pt will go Supine/Side to Sit: with min assist PT Goal: Supine/Side to Sit - Progress: Not met Pt will Sit at [facility] of Bed: with modified independence PT Goal: Sit at Edge Of Bed - Progress: Progressing toward goal Pt will go Sit to Supine/Side: with min assist PT Goal: Sit to Supine/Side - Progress: Not met Pt will go Sit to Stand: with min assist PT Goal: Sit to Stand - Progress: Progressing toward goal Pt will go Stand to Sit: with min assist PT Goal: Stand to Sit - Progress: Progressing toward goal Pt will Transfer Bed to Chair/Chair to Bed: with mod assist PT Transfer Goal: Bed to Chair/Chair to Bed - Progress: Progressing toward goal Pt will Ambulate: 16 - 50 feet;with mod assist;with least restrictive assistive device PT Goal: Ambulate - Progress: Progressing toward goal  Visit Information  Last PT Received On: 10/19/12 Assistance Needed: +2    Subjective Data      Cognition  Cognition Arousal/Alertness: Awake/alert Behavior During Therapy: WFL for tasks assessed/performed Overall Cognitive Status: Impaired/Different from baseline Current Attention Level: Sustained Following Commands: Follows one step commands with increased time;Follows one step commands inconsistently Difficult to assess due to: Impaired communication    Balance  Balance Balance Assessed: Yes Static Sitting Balance Static Sitting - Balance Support: Feet supported;Left upper extremity supported Static Sitting - Level of Assistance: 5: Stand by assistance Static Standing Balance Static Standing - Balance Support: During functional activity;Bilateral upper extremity  supported Static  Standing - Level of Assistance: Other (comment);3: Mod assist (min guard once set up ) Static Standing - Comment/# of Minutes: up to 25 secs statically  End of Session PT - End of Session Equipment Utilized During Treatment: Gait belt Activity Tolerance: Patient tolerated treatment well Patient left: in chair;with call bell/phone within reach;with family/visitor present Nurse Communication: Mobility status   GP     Eder Macek, Eliseo Gum 10/19/2012, 3:40 PM 10/19/2012  Zachary Bing, PT [phone] (pager)

## 2012-10-20 ENCOUNTER — Inpatient Hospital Stay (HOSPITAL_COMMUNITY)
Admission: AD | Admit: 2012-10-20 | Discharge: 2012-11-09 | DRG: 462 | Disposition: A | Discharge status: Other Acute Inpatient Hospital | Payer: BC Managed Care – PPO | Source: Intra-hospital | Attending: Physical Medicine & Rehabilitation | Admitting: Physical Medicine & Rehabilitation

## 2012-10-20 ENCOUNTER — Encounter (HOSPITAL_COMMUNITY): Payer: Self-pay | Admitting: *Deleted

## 2012-10-20 DIAGNOSIS — I6340 Cerebral infarction due to embolism of unspecified cerebral artery: Secondary | ICD-10-CM | POA: Diagnosis present

## 2012-10-20 DIAGNOSIS — R51 Headache: Secondary | ICD-10-CM | POA: Diagnosis present

## 2012-10-20 DIAGNOSIS — R1310 Dysphagia, unspecified: Secondary | ICD-10-CM | POA: Diagnosis present

## 2012-10-20 DIAGNOSIS — E785 Hyperlipidemia, unspecified: Secondary | ICD-10-CM | POA: Diagnosis present

## 2012-10-20 DIAGNOSIS — Z5189 Encounter for other specified aftercare: Principal | ICD-10-CM

## 2012-10-20 DIAGNOSIS — F329 Major depressive disorder, single episode, unspecified: Secondary | ICD-10-CM | POA: Diagnosis present

## 2012-10-20 DIAGNOSIS — Z8673 Personal history of transient ischemic attack (TIA), and cerebral infarction without residual deficits: Secondary | ICD-10-CM

## 2012-10-20 DIAGNOSIS — I2510 Atherosclerotic heart disease of native coronary artery without angina pectoris: Secondary | ICD-10-CM | POA: Diagnosis present

## 2012-10-20 DIAGNOSIS — I6330 Cerebral infarction due to thrombosis of unspecified cerebral artery: Secondary | ICD-10-CM

## 2012-10-20 DIAGNOSIS — M62838 Other muscle spasm: Secondary | ICD-10-CM | POA: Diagnosis not present

## 2012-10-20 DIAGNOSIS — I6529 Occlusion and stenosis of unspecified carotid artery: Secondary | ICD-10-CM | POA: Diagnosis present

## 2012-10-20 DIAGNOSIS — I639 Cerebral infarction, unspecified: Secondary | ICD-10-CM

## 2012-10-20 DIAGNOSIS — F3289 Other specified depressive episodes: Secondary | ICD-10-CM | POA: Diagnosis present

## 2012-10-20 DIAGNOSIS — I10 Essential (primary) hypertension: Secondary | ICD-10-CM | POA: Diagnosis present

## 2012-10-20 HISTORY — DX: Unspecified osteoarthritis, unspecified site: M19.90

## 2012-10-20 HISTORY — DX: Cerebral infarction, unspecified: I63.9

## 2012-10-20 MED ORDER — TRAZODONE HCL 50 MG PO TABS
25.0000 mg | ORAL_TABLET | Freq: Every evening | ORAL | Status: DC | PRN
  Administered 2012-11-05: 50 mg via ORAL
  Administered 2012-11-06: 25 mg via ORAL
  Administered 2012-11-08 (×2): 50 mg via ORAL
  Filled 2012-10-20 (×4): qty 1

## 2012-10-20 MED ORDER — HYDROCODONE-ACETAMINOPHEN 5-325 MG PO TABS
1.0000 | ORAL_TABLET | Freq: Four times a day (QID) | ORAL | Status: DC | PRN
  Administered 2012-10-21 – 2012-10-30 (×5): 1 via ORAL
  Filled 2012-10-20 (×5): qty 1

## 2012-10-20 MED ORDER — PANTOPRAZOLE SODIUM 40 MG PO TBEC
40.0000 mg | DELAYED_RELEASE_TABLET | Freq: Every day | ORAL | Status: DC
  Administered 2012-10-21 – 2012-10-22 (×2): 40 mg via ORAL
  Filled 2012-10-20 (×4): qty 1

## 2012-10-20 MED ORDER — PROCHLORPERAZINE EDISYLATE 5 MG/ML IJ SOLN
5.0000 mg | Freq: Four times a day (QID) | INTRAMUSCULAR | Status: DC | PRN
  Filled 2012-10-20: qty 2

## 2012-10-20 MED ORDER — ONDANSETRON HCL 4 MG/2ML IJ SOLN: 4.0000 mg | Freq: Four times a day (QID) | INTRAMUSCULAR | Status: DC | PRN

## 2012-10-20 MED ORDER — BIOTENE DRY MOUTH MT LIQD
15.0000 mL | Freq: Two times a day (BID) | OROMUCOSAL | Status: DC
  Administered 2012-10-20 – 2012-11-08 (×30): 15 mL via OROMUCOSAL

## 2012-10-20 MED ORDER — ATORVASTATIN CALCIUM 40 MG PO TABS
40.0000 mg | ORAL_TABLET | Freq: Every day | ORAL | Status: DC
  Administered 2012-10-20 – 2012-11-08 (×20): 40 mg via ORAL
  Filled 2012-10-20 (×21): qty 1

## 2012-10-20 MED ORDER — VENLAFAXINE HCL 25 MG PO TABS
25.0000 mg | ORAL_TABLET | Freq: Three times a day (TID) | ORAL | Status: DC
  Administered 2012-10-21 – 2012-11-08 (×56): 25 mg via ORAL
  Filled 2012-10-20 (×63): qty 1

## 2012-10-20 MED ORDER — DIPHENHYDRAMINE HCL 12.5 MG/5ML PO ELIX
12.5000 mg | ORAL_SOLUTION | Freq: Four times a day (QID) | ORAL | Status: DC | PRN
  Filled 2012-10-20: qty 10

## 2012-10-20 MED ORDER — ASPIRIN EC 325 MG PO TBEC
325.0000 mg | DELAYED_RELEASE_TABLET | Freq: Every day | ORAL | Status: DC
  Administered 2012-10-21 – 2012-10-22 (×2): 325 mg via ORAL
  Filled 2012-10-20 (×4): qty 1

## 2012-10-20 MED ORDER — ACETAMINOPHEN 325 MG PO TABS
650.0000 mg | ORAL_TABLET | Freq: Four times a day (QID) | ORAL | Status: DC | PRN
  Administered 2012-11-03: 650 mg via ORAL
  Filled 2012-10-20: qty 2

## 2012-10-20 MED ORDER — LORATADINE 10 MG PO TABS
10.0000 mg | ORAL_TABLET | Freq: Every day | ORAL | Status: DC
  Administered 2012-10-21 – 2012-11-08 (×19): 10 mg via ORAL
  Filled 2012-10-20 (×22): qty 1

## 2012-10-20 MED ORDER — ALUM & MAG HYDROXIDE-SIMETH 200-200-20 MG/5ML PO SUSP: 30.0000 mL | ORAL | Status: DC | PRN

## 2012-10-20 MED ORDER — ENOXAPARIN SODIUM 40 MG/0.4ML ~~LOC~~ SOLN
40.0000 mg | SUBCUTANEOUS | Status: DC
  Administered 2012-10-21 – 2012-11-08 (×19): 40 mg via SUBCUTANEOUS
  Filled 2012-10-20 (×20): qty 0.4

## 2012-10-20 MED ORDER — PROCHLORPERAZINE MALEATE 5 MG PO TABS
5.0000 mg | ORAL_TABLET | Freq: Four times a day (QID) | ORAL | Status: DC | PRN
  Filled 2012-10-20: qty 2

## 2012-10-20 MED ORDER — FLEET ENEMA 7-19 GM/118ML RE ENEM
1.0000 | ENEMA | Freq: Once | RECTAL | Status: AC | PRN
  Filled 2012-10-20: qty 1

## 2012-10-20 MED ORDER — CLOPIDOGREL BISULFATE 75 MG PO TABS
75.0000 mg | ORAL_TABLET | Freq: Every day | ORAL | Status: DC
  Administered 2012-10-21 – 2012-11-09 (×20): 75 mg via ORAL
  Filled 2012-10-20 (×21): qty 1

## 2012-10-20 MED ORDER — BISACODYL 10 MG RE SUPP
10.0000 mg | Freq: Every day | RECTAL | Status: DC | PRN
  Administered 2012-11-08: 10 mg via RECTAL

## 2012-10-20 MED ORDER — FLUTICASONE PROPIONATE 50 MCG/ACT NA SUSP
1.0000 | Freq: Every day | NASAL | Status: DC | PRN
  Filled 2012-10-20: qty 16

## 2012-10-20 MED ORDER — PROCHLORPERAZINE 25 MG RE SUPP
12.5000 mg | Freq: Four times a day (QID) | RECTAL | Status: DC | PRN
  Filled 2012-10-20: qty 1

## 2012-10-20 MED ORDER — NICOTINE 14 MG/24HR TD PT24
14.0000 mg | MEDICATED_PATCH | Freq: Every day | TRANSDERMAL | Status: DC
  Administered 2012-10-21 – 2012-11-08 (×19): 14 mg via TRANSDERMAL
  Filled 2012-10-20 (×23): qty 1

## 2012-10-20 MED ORDER — GUAIFENESIN-DM 100-10 MG/5ML PO SYRP: 5.0000 mL | ORAL_SOLUTION | Freq: Four times a day (QID) | ORAL | Status: DC | PRN

## 2012-10-20 MED ORDER — LORATADINE 5 MG/5ML PO SYRP: 10.0000 mg | ORAL_SOLUTION | Freq: Every day | ORAL | Status: DC

## 2012-10-20 MED ORDER — CLONAZEPAM 0.5 MG PO TABS
0.2500 mg | ORAL_TABLET | Freq: Two times a day (BID) | ORAL | Status: DC | PRN
  Administered 2012-10-30: 0.25 mg via ORAL
  Filled 2012-10-20: qty 1

## 2012-10-20 MED ORDER — POLYETHYLENE GLYCOL 3350 17 G PO PACK
17.0000 g | PACK | Freq: Every day | ORAL | Status: DC | PRN
  Filled 2012-10-20: qty 1

## 2012-10-20 MED ORDER — PANTOPRAZOLE SODIUM 40 MG IV SOLR
40.0000 mg | Freq: Every day | INTRAVENOUS | Status: DC
  Filled 2012-10-20: qty 40

## 2012-10-20 NOTE — Progress Notes (Signed)
Rehab admissions - I have not heard back from [facility] yet about possible inpatient rehab admission.  I will let all know when I have an answer from [facility].  Call me for questions.  #[phone]

## 2012-10-20 NOTE — Discharge Summary (Signed)
Stroke Discharge Summary  Patient ID: Tiffany [name]   MRN: [id number]      DOB: 12-16-1950  Date of Admission: 10/16/2012 Date of Discharge: 10/20/2012  Attending Physician:  Tiffany Cheshire, MD, Stroke MD  Consulting Physician(s):  Treatment Team:  Md Stroke, MD, Tiffany Laws, MD (Physical Medicine & Rehabtilitation)   Patient's PCP:  Tiffany Curia, MD  Discharge Diagnoses:   left insular & frontal cortex infarct, status post IV t-PA. Resultant mild cytotoxic cerebral edema. source of infarct felt to be thromboembolic from LICA occlusion.   Resultant:  expressive and receptive aphasia, Right hemiparesis, right field cut Hypertension  Hyperlipidemia  L ICA occlusion  R > 80% ICA stenosis, with recent infarct 08/28/12 HgbA1c 6.8  Obesity, BMI  Body mass index is 32.57 kg/(m^2).   Past Medical History  Diagnosis Date  . Coronary artery disease   . Hypertension   . TIA (transient ischemic attack)   . High cholesterol   . MI (myocardial infarction)    Past Surgical History  Procedure Laterality Date  . Cardiac surgery    . Tee without cardioversion N/A 10/18/2012    Procedure: TRANSESOPHAGEAL ECHOCARDIOGRAM (TEE);  Surgeon: Tiffany Mixer, MD;  Location: [facility] ENDOSCOPY;  Service: Cardiovascular;  Laterality: N/A;    Medications to be continued on Rehab . antiseptic oral rinse  15 mL Mouth Rinse BID  . aspirin  300 mg Rectal Daily   Or  . aspirin  325 mg Oral Daily  . atorvastatin  40 mg Oral q1800  . clopidogrel  75 mg Oral Q breakfast  . enoxaparin (LOVENOX) injection  40 mg Subcutaneous Q24H  . nicotine  14 mg Transdermal Daily  . pantoprazole (PROTONIX) IV  40 mg Intravenous QHS   LABORATORY STUDIES CBC    Component Value Date/Time   WBC 11.8* 10/16/2012 1000   RBC 4.81 10/16/2012 1000   HGB 15.6* 10/16/2012 1010   HCT 46.0 10/16/2012 1010   PLT 241 10/16/2012 1000   MCV 88.6 10/16/2012 1000   MCH 31.4 10/16/2012 1000   MCHC 35.4 10/16/2012 1000   RDW 13.4  10/16/2012 1000   LYMPHSABS 3.9 10/16/2012 1000   MONOABS 1.0 10/16/2012 1000   EOSABS 0.3 10/16/2012 1000   BASOSABS 0.1 10/16/2012 1000   CMP    Component Value Date/Time   NA 139 10/16/2012 1010   K 4.2 10/16/2012 1010   CL 105 10/16/2012 1010   CO2 25 10/16/2012 1000   GLUCOSE 115* 10/16/2012 1010   BUN 9 10/16/2012 1010   CREATININE 0.90 10/16/2012 1010   CALCIUM 9.7 10/16/2012 1000   PROT 7.1 10/16/2012 1000   ALBUMIN 3.8 10/16/2012 1000   AST 16 10/16/2012 1000   ALT 10 10/16/2012 1000   ALKPHOS 104 10/16/2012 1000   BILITOT 0.3 10/16/2012 1000   GFRNONAA 74* 10/16/2012 1000   GFRAA 86* 10/16/2012 1000   COAGS Lab Results  Component Value Date   INR 0.96 10/16/2012   Lipid Panel    Component Value Date/Time   CHOL 170 10/17/2012 0706   TRIG 132 10/17/2012 0706   HDL 36* 10/17/2012 0706   CHOLHDL 4.7 10/17/2012 0706   VLDL 26 10/17/2012 0706   LDLCALC 108* 10/17/2012 0706   HgbA1C  Lab Results  Component Value Date   HGBA1C 5.8* 10/17/2012   Cardiac Panel (last 3 results) No results found for this basename: CKTOTAL, CKMB, TROPONINI, RELINDX,  in the last 72 hours Urinalysis    Component Value  Date/Time   COLORURINE YELLOW 10/16/2012 1519   APPEARANCEUR HAZY* 10/16/2012 1519   LABSPEC 1.013 10/16/2012 1519   PHURINE 5.5 10/16/2012 1519   GLUCOSEU NEGATIVE 10/16/2012 1519   HGBUR NEGATIVE 10/16/2012 1519   BILIRUBINUR NEGATIVE 10/16/2012 1519   KETONESUR NEGATIVE 10/16/2012 1519   PROTEINUR NEGATIVE 10/16/2012 1519   UROBILINOGEN 0.2 10/16/2012 1519   NITRITE NEGATIVE 10/16/2012 1519   LEUKOCYTESUR NEGATIVE 10/16/2012 1519   Urine Drug Screen     Component Value Date/Time   LABOPIA NONE DETECTED 10/16/2012 1519   COCAINSCRNUR NONE DETECTED 10/16/2012 1519   LABBENZ NONE DETECTED 10/16/2012 1519   AMPHETMU NONE DETECTED 10/16/2012 1519   THCU NONE DETECTED 10/16/2012 1519   LABBARB NONE DETECTED 10/16/2012 1519    Alcohol Level    Component Value Date/Time   ETH <11 10/16/2012 1000     SIGNIFICANT DIAGNOSTIC STUDIES CT of the brain  10/17/2012 Developing focus of low attenuation change with indistinct gray- white junctions in the left insular cortex and anterior deep white matter are consistent with early changes of acute infarct. No acute intracranial hemorrhage.  10/16/2012 No acute abnormality.  10/16/2012 Cannot exclude early cytotoxic edema in the insula. Bilateral subcortical white matter hypodensity noted, including at the left operculum. No acute intracranial hemorrhage. No suspicious intracranial vascular hyperdensity.  MRI of the brain 10/17/2012 1. Left MCA territory acute infarct. Insula and operculum most affected. Also patchy involvement of the left motor strip, superior parietal lobe, and left splenium. 2. Mild cytotoxic edema without mass effect at this time. No associated intracranial hemorrhage. 3. See MRA findings below. Evidence of decreased flow in the left ICA at the skull base and in the posterior left MCA branches. 4. Underlying moderate cerebral white matter signal changes most suggestive of chronic white matter small vessel ischemia.  MRA of the brain 10/17/2012 1. Left MCA occluded in the M1 segment with no distal flow signal detected. 2. Little to no flow in the left distal cervical ICA and majority of the left ICA siphon. Reconstituted flow signal the left ICA terminus likely from collaterals. 3. Motion degraded, but the remaining anterior circulation appears within normal limits. 4. Negative posterior circulation.  2D Echocardiogram EF 60-65% with no source of embolus.  TEE: no PFO or ASD  Carotid Doppler Left ICA occluded and <80% right ICA stenosis.  CXR 10/16/2012 Slight bronchitic changes.  EKG normal sinus rhythm.   History of Present Illness   Tiffany [name] is an 62 y.o. female with a past medical history significant for hypertension, hypercholesterolemia, CAD s/p MI and s/p stenting, TIA last year, brought to [facility] by medics as a code stroke due  to acute onset right hemiparesis, aphasia, and right face droopiness. Patient's husband stated that Tiffany [name] woke up at 7 am 10/16/2012 feeling well, had breakfast, and around 7:50 developed weakness of the right side of her body and couldn't talk. EMS was called and when they arrived to the scene she was not able to follow commands, aphasic with right hemiparesis and right face weakness. Upon arrival to ED she scored 13 in the NIHSS and had an emergent CT brain that showed a subtle area of deceased attenuation in the left insula and left operculum. Patient is alert but aphasic. Patient was a TPA candidate. She was admitted to the neuro ICU for further evaluation and treatment.   Additional history after admission:  on August 28, 2012 patient experienced right sided weakness, seen by MD and diagnosed with back problems.  She did therapy without improvement. She was seen to a neurologist, Dr. Lendell Caprice in [place], who ordered an MRI which showed a "small stroke" and suggested carotid occluion. Based on MRI report, MRA was ordered MRA was done last week at [facility]. Per daughter, Dr. Lendell Caprice never recevied results.  Dr Pearlean Brownie searched for MRI/ MRA results were listed under [facility] and was able to review report.  Hospital Course  Patient tolerated tPA without complication. Imaging at 24 hours shows no hemorrhage. MRI confirmed ischemic infarct in the Left MCA territory, insula and operculum most affected with patchy involvement of the left motor strip, superior parietal lobe, and left splenium. There was m ild cytotoxic edema without mass effect. Stroke was felt to be embolic secondary to unidentified source. She was started on  aspirin 325 mg orally every day for secondary stroke prevention, then changed to aspirin 81 an plavix 75 mg once she was found to have incidental finding of right ICA > 80% stenosis that will need to be addressed at the end of her rehab stay. Of note, patient also with a new  finding of L ICA occlusion, for which there is no treatment.   Additional vascular risk factors include Hypertension, within acceptable range given ICA stenosis Hyperlipidemia, LDL 108, not on statin PTA, now on lipitor 40 mg, goal LDL < 100  CAD - MI  Hx TIA  Obesity, Body mass index is 32.57 kg/(m^2).   Patient has resultant expressive and receptive aphasia, right hemiparesis (face, arm and leg), right field cut. Physical therapy, occupational therapy and speech therapy evaluated patient. All agreed inpatient rehab is needed. Patient's family is/are supportive and can provide care at discharge. CIR bed is available today and patient will be transferred there.  Discharge Exam  Blood pressure 140/67, pulse 88, temperature 98.3 F (36.8 C), temperature source Oral, resp. rate 18, height 5\' 4"  (1.626 m), weight 86.1 kg (189 lb 13.1 oz), SpO2 95.00%. Pleasant elderly caucasian lady not in distress.Awake alert. Afebrile. Head is nontraumatic. Neck is supple without bruit. Cardiac exam no murmur or gallop. Lungs are clear to auscultation. Distal pulses are well felt.  Neurological Exam : Awake and alert significant expressive aphasia and speaks only a few words with dysarthria. Unable to speak sentences, name, repeat a right. Can follow simple midline commands only. Left gaze preference but can look to the right past midline. Does not blink to threat on the right but does so on the left. Severe right lower facial weakness. Tongue is midline. Cough and gag a week. Dense right upper extremity plegia with 0/5 strength with minimum withdrawal to painful stimuli. Mild right lower extremity drift with 3/5 strength. Right plantar is upgoing. There appears diminished sensation on the right. Normal strength on the left.   Discharge Diet  Dysphagia 3 thin liquids  Discharge Plan  Disposition:  Transfer to [facility] for ongoing PT, OT and ST  aspirin 81 mg orally every day and clopidogrel 75  mg orally every day for secondary stroke prevention given plans for upcoming stent  Ongoing risk factor control by Primary Care Physician. Risk factor recommendations:  Hypertension target range 130-140/70-80 Lipid range - LDL < 100 and checked every 6 months, fasting Diabetes - HgB A1C <7 Weight loss  Plan elective right ICA revascularization after CIR stay by Dr Corliss Skains    Follow-up Tiffany Curia, MD in 1 month.  Follow-up with Dr. Delia Heady in 2 months.  35 minutes were spent preparing discharge.  Signed Annie Main, AVNP, ANP-BC, [facility] Nurse Practitioner 10/20/2012, 12:17 PM  I have personally examined this patient, reviewed pertinent data and developed the plan of care. I agree with above. Delia Heady, MD

## 2012-10-20 NOTE — Plan of Care (Addendum)
Overall Plan of Care [facility]) Patient Details Name: Tiffany [name] MRN: [id number] DOB: 07/07/1950  Diagnosis:  Left MCA infarct embolic  Co-morbidities: Coronary artery disease  .  Hypertension  .  TIA (transient ischemic attack)  .  High cholesterol  .  MI (myocardial infarction)    Functional Problem List  Patient demonstrates impairments in the following areas: Balance, Bladder, Bowel, Endurance, Motor, Pain and Safety  Basic ADL's: eating, grooming, bathing, dressing, toileting and transfers Advanced ADL's: none  Transfers:  bed mobility, bed to chair, toilet, tub/shower, car, furniture and floor Locomotion:  ambulation and stairs  Additional Impairments:  Swallowing, Communication  comprehension and expression and Social Cognition   problem solving  Anticipated Outcomes Item Anticipated Outcome  Eating/Swallowing  Mod I with least restrictive diet  Basic self-care  supervision  Tolieting  supervision  Bowel/Bladder  Cont of B/B with min assist  Transfers  S/min-A  Locomotion  S/min-A using LRAD S/Mmin-A up/down steps  Communication  Min A-Supervision  Cognition  Supervision  Pain  Less than or equal to 3.  Safety/Judgment    Other     Therapy Plan: PT Intensity: Minimum of 1-2 x/day ,45 to 90 minutes PT Frequency: 5 out of 7 days PT Duration Estimated Length of Stay: 2 weeks OT Intensity: Minimum of 1-2 x/day, 45 to 90 minutes OT Frequency: 5 out of 7 days OT Duration/Estimated Length of Stay: 2 weeks SLP Intensity: Minumum of 1-2 x/day, 30 to 90 minutes SLP Frequency: 5 out of 7 days SLP Duration/Estimated Length of Stay: 3 weeks    Team Interventions: Item RN PT OT SLP SW TR Other  Self Care/Advanced ADL Retraining   X      Neuromuscular Re-Education  x X      Therapeutic Activities  x X x  x   UE/LE Strength Training/ROM  x X   x   UE/LE Coordination Activities  x X   x   Visual/Perceptual Remediation/Compensation   X   x    DME/Adaptive Equipment Instruction  x X   x   Therapeutic Exercise  x X   x   Balance/Vestibular Training  x X   x   Patient/Family Education x x X x  x   Cognitive Remediation/Compensation   X x  x   Functional Mobility Training  x X   x   Ambulation/Gait Training  x       Stair Training  x       Wheelchair Propulsion/Positioning   X      Health and safety inspector Reintegration      x   Dysphagia/Aspiration Precaution Training x   x     Speech/Language Facilitation    x     Bladder Management x        Bowel Management x        Disease Management/Prevention x        Pain Management x        Medication Management x        Skin Care/Wound Management x        Splinting/Orthotics         Discharge Planning   X x  x   Psychosocial Support x  X x  x                          Team Discharge Planning: Destination: PT-Home ,OT-  HOME , SLP-Home Projected Follow-up: PT-Home health PT, OT-HOME HEALTH   , SLP-Outpatient SLP Projected Equipment Needs: PT- , OT-TUB BENCH, GRAB BARS,RAMP, HH SHOWER HEAD  , SLP-None recommended by SLP Patient/family involved in discharge planning: PT- Patient,  OT-Family member/caregiver;Patient, SLP-Patient;Family member/caregiver  MD ELOS: 3 weeks Medical Rehab Prognosis:  Good Assessment: 62 yo female admitted with L MCA infarct now requiring 24/7 rehab RN/MD, CIR level PT/OT/SLP.  Treatment team to focus on R HP, aphasia,with Sup/MinA goals for ADL and Mobility.    See Team Conference Notes for weekly updates to the plan of care

## 2012-10-20 NOTE — H&P (View-Only) (Signed)
Physical Medicine and Rehabilitation Admission H&P    Chief Complaint  Patient presents with  . Difficulty speaking and right sided weakness    HPI: Tiffany [name] is a 62 y.o. RH- female with history of CAD, HTN, TIA event a month ago; who developed difficulty speaking with right sided weakness on am of 10/16/12. CCT with bilateral subcortical hypodensities and patient treated with TPA. Carotid dopplers with <80% right ICA stenosis. 2 D echo with EF 60-65% and grade one dysfunction. Around midnight on 04/29 patient with progression of symptoms with dense right hemiparesis with inability to follow commands. Follow up CCT with developing focus of low attenuation left insular cortex and deep left frontal white matter, no hemorrhage. MRI brain done revealing L-MCA territory infarct affecting insula, operculum, left motor strip, superior parietal lobe, and left splenium. MRA brain with LMCA occlusion in M1 segment with no distal flow signal. 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   TEE done showed no thrombus, ASD or PFO. Dr. Corliss Skains was consulted for input on endovascular revascularization/stenting of R-ICA stenosis and patient/family have agreed to undergo procedure just prior to discharge from Rehab. MBS done revealing oral dysphagia with sensory motor deficits and patient placed on D2, thin liquids. Patient continues to be limited by aphasia with dysphagia as well as right sided weakness as well with inattention of RUE. Therapy working on pre-gait activities and recommended CIR for progressive therapies.    Review of Systems  Eyes: Positive for double vision (Can't see on the right. ).  Respiratory: Negative for shortness of breath.   Cardiovascular: Negative for chest pain and palpitations.  Gastrointestinal: Negative for heartburn, nausea, abdominal pain and constipation.  Genitourinary: Positive for urgency and frequency.  Neurological:  Positive for sensory change, speech change, focal weakness and headaches (Onset with TIA event a month ago. ).  Psychiatric/Behavioral: Positive for depression. The patient is nervous/anxious.    Past Medical History  Diagnosis Date  . Coronary artery disease   . Hypertension   . TIA (transient ischemic attack)   . High cholesterol   . MI (myocardial infarction)    Past Surgical History  Procedure Laterality Date  . Cardiac surgery    . Tee without cardioversion N/A 10/18/2012    Procedure: TRANSESOPHAGEAL ECHOCARDIOGRAM (TEE);  Surgeon: Vesta Mixer, MD;  Location: [facility] ENDOSCOPY;  Service: Cardiovascular;  Laterality: N/A;   History reviewed. No pertinent family history.  Social History:  Dortha Kern with husband. Works as a Engineer, civil (consulting). She reports that she has been smoking.  She does not have any smokeless tobacco history on file. She reports that she does not drink alcohol or use illicit drugs.    Allergies  Allergen Reactions  . Codeine Nausea Only   Medications Prior to Admission  Medication Sig Dispense Refill  . aspirin EC 325 MG tablet Take 325 mg by mouth daily.      Marland Kitchen atorvastatin (LIPITOR) 40 MG tablet Take 40 mg by mouth daily.      . cetirizine (ZYRTEC) 10 MG tablet Take 10 mg by mouth daily.      . clonazePAM (KLONOPIN) 0.5 MG tablet Take 0.5 mg by mouth 3 (three) times daily as needed for anxiety.      Marland Kitchen estrogen, conjugated,-medroxyprogesterone (PREMPRO) 0.625-2.5 MG per tablet Take 1 tablet by mouth daily.      Marland Kitchen HYDROcodone-acetaminophen (VICODIN) 5-500 MG per tablet Take 1 tablet by mouth every 8 (eight) hours  as needed for pain.      . metoprolol (LOPRESSOR) 50 MG tablet Take 50 mg by mouth 2 (two) times daily.      . mometasone (NASONEX) 50 MCG/ACT nasal spray Place 2 sprays into the nose daily.      . nabumetone (RELAFEN) 750 MG tablet Take 750 mg by mouth 2 (two) times daily.      Marland Kitchen venlafaxine (EFFEXOR) 25 MG tablet Take 25 mg by mouth 3 (three) times  daily.      Marland Kitchen venlafaxine XR (EFFEXOR-XR) 150 MG 24 hr capsule Take 150 mg by mouth daily.        Home: Home Living Lives With:  (unknown at this time due to expressive aphasia) Additional Comments: Pt responds to yes not questions 6 out 7 correctly   Functional History: Prior Function Able to Take Stairs?: Yes (Could walk stairs prior to CVA on 03/10) Driving: Yes (Driving up until 16/10.  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, site.)  Functional Status:  Mobility: Bed Mobility Bed Mobility: Not assessed (pt up in chair) Supine to Sit: 2: Max assist;HOB elevated Sitting - Scoot to Edge of Bed: 2: Max assist Sit to Supine: 2: Max assist;HOB elevated Transfers Transfers: Sit to Stand;Stand to Sit Sit to Stand: 1: +2 Total assist;From chair/3-in-1;With armrests;With upper extremity assist Sit to Stand: Patient Percentage: 70% Stand to Sit: 1: +2 Total assist;With upper extremity assist;To chair/3-in-1 Stand to Sit: Patient Percentage: 70% Stand Pivot Transfers: 1: +2 Total assist Stand Pivot Transfers: Patient Percentage: 40% Ambulation/Gait Ambulation/Gait Assistance: Not tested (comment) Stairs: No    ADL: ADL Eating/Feeding: NPO Grooming: Performed;Wash/dry face;Maximal assistance Where Assessed - Grooming: Supported sitting Toilet Transfer: Chief of Staff Method: Sit to Barista: Other (comment) (recliner) Equipment Used: Gait belt Transfers/Ambulation Related to ADLs: Pt performed multiple sit<>stands from chair at countertop in room.  Min-mod manual assist to R knee for stability esp during weight shifting.   ADL Comments: Focus on sitting balance EOC and standing balance. Pt requiring multimodal cues for anterior lean which sitting EOC as precursor for power up.  Pt demonstrating neglect of RUE. Brunstrom level 2 in RUE.  Pt with RUE tone and at  times drawing RUE into flexor position with internal rotation (nonvolunatry).  No active movement noted in hand.   Cognition: Cognition Overall Cognitive Status: Impaired/Different from baseline Arousal/Alertness: Awake/alert Orientation Level: Oriented to person Attention: Focused;Sustained;Selective Focused Attention: Appears intact Sustained Attention: Appears intact Selective Attention: Impaired Selective Attention Impairment: Functional basic Memory:  (UTA) Awareness: Impaired Awareness Impairment: Intellectual impairment;Emergent impairment Problem Solving: Impaired Problem Solving Impairment: Functional basic Executive Function: Initiating Initiating: Impaired Initiating Impairment: Verbal basic;Functional basic Behaviors: Restless Cognition Arousal/Alertness: Awake/alert Behavior During Therapy: WFL for tasks assessed/performed Overall Cognitive Status: Impaired/Different from baseline Area of Impairment: Attention;Following commands;Safety/judgement Current Attention Level: Sustained Following Commands: Follows one step commands with increased time;Follows one step commands inconsistently Difficult to assess due to: Impaired communication  Physical Exam: Blood pressure 143/63, pulse 87, temperature 98.7 F (37.1 C), temperature source Oral, resp. rate 20, height 5\' 4"  (1.626 m), weight 86.1 kg (189 lb 13.1 oz), SpO2 100.00%.  Physical Exam  Nursing note and vitals reviewed. Constitutional: She is oriented to person, place, and time. She appears well-developed and well-nourished. No distress.  HENT:  Head: Normocephalic and atraumatic.  Right Ear: External ear normal.  Left Ear: External ear normal.  Eyes: Conjunctivae and  EOM are normal. Pupils are equal, round, and reactive to light.  Neck: Normal range of motion. No JVD present. No tracheal deviation present. No thyromegaly present.  Cardiovascular: Normal rate and regular rhythm.  Exam reveals friction rub.    No murmur heard. Pulmonary/Chest: Effort normal and breath sounds normal. No respiratory distress. She has no wheezes. She has no rales.  Abdominal: Soft. Bowel sounds are normal. She exhibits no distension. There is no tenderness.  Musculoskeletal: Normal range of motion.  Lymphadenopathy:    She has no cervical adenopathy.  Neurological: She is alert and oriented to person, place, and time.  Alert. Right facial weakness and tongue deviation with  Increased oral secretions. Dysarthric, ataxic speech with expressive difficulty. Oriented X 3. Able to give biographic information with occasional phenomic cues.  Apraxia noted. Needs occasional visual cues to follow one step commands.  Right field cut. Right hemiparesis: RUE trace biceps, pec, 0/5 elsewhere. RLE is tr to 1/5 HF and KF, and absent elswehere. Sensation 1/2 throughout the right side.   Skin: Skin is warm and dry.  Psychiatric: Thought content normal. Her mood appears anxious. Her affect is labile.    No results found for this or any previous visit (from the past 48 hour(s)).   Post Admission Physician Evaluation: 1. Functional deficits secondary  to embolic left MCA infarct. 2. Patient is admitted to receive collaborative, interdisciplinary care between the physiatrist, rehab nursing staff, and therapy team. 3. Patient's level of medical complexity and substantial therapy needs in context of that medical necessity cannot be provided at a lesser intensity of care such as a SNF. 4. Patient has experienced substantial functional loss from his/her baseline which was documented above under the "Functional History" and "Functional Status" headings.  Judging by the patient's diagnosis, physical exam, and functional history, the patient has potential for functional progress which will result in measurable gains while on inpatient rehab.  These gains will be of substantial and practical use upon discharge  in facilitating mobility and self-care  at the household level. 5. Physiatrist will provide 24 hour management of medical needs as well as oversight of the therapy plan/treatment and provide guidance as appropriate regarding the interaction of the two. 6. 24 hour rehab nursing will assist with bladder management, bowel management, safety, skin/wound care, disease management, medication administration, pain management and patient education  and help integrate therapy concepts, techniques,education, etc. 7. PT will assess and treat for/with: Lower extremity strength, range of motion, stamina, balance, functional mobility, safety, adaptive techniques and equipment, NMR, education.   Goals are: supervision to minimal assist. 8. OT will assess and treat for/with: ADL's, functional mobility, safety, upper extremity strength, adaptive techniques and equipment, NMR, CPT, education.   Goals are: supervision to minimal assist. 9. SLP will assess and treat for/with: speech, language, communication, cognition.  Goals are: supervision to minimal assist. 10. Case Management and Social Worker will assess and treat for psychological issues and discharge planning. 11. Team conference will be held weekly to assess progress toward goals and to determine barriers to discharge. 12. Patient will receive at least 3 hours of therapy per day at least 5 days per week. 13. ELOS: 3 weeks      Prognosis:  good   Medical Problem List and Plan: 1. DVT Prophylaxis/Anticoagulation: Pharmaceutical: Lovenox 2. Pain Management:  Will resume norco prn for headaches.  3. Depression:  Occasionally crying/anxious during exam. Will resume Effexor and klonopin as at home.  Team to continue to provide  Ego support and encouragement.  Will have LCSW and psychologist follow for support.  4. Neuropsych: This patient is  capable of making decisions on her own behalf. 5. HTN: will monitor with bid checks. Continue labetalol. Titrate medications as needed for better control.  6.  Dyslipidemia: Continue Lipitor.  7. CAD: Will continue lipitor and ASA. Monitor for symptoms with activity.  8. Dysphagia: Needs full supervision at meals. Will continue Modified diet per ST recommendations.   Ranelle Oyster, MD, [place] [facility]   10/19/2012

## 2012-10-20 NOTE — Progress Notes (Signed)
Rehab admissions - I have authorization from [facility] for acute inpatient rehab admission.  Bed available and will plan to admit today.  Call me for questions.  #[phone]

## 2012-10-20 NOTE — Progress Notes (Signed)
Physical Therapy Treatment Patient Details Name: Tiffany [name] MRN: [id number] DOB: September 28, 1950 Today's Date: 10/20/2012 Time: 6295-2841 PT Time Calculation (min): 25 min  PT Assessment / Plan / Recommendation Comments on Treatment Session  Emphasized working on  sit to stands to work on translating forward and sit to stand to work on standing balance. Great rehab candidate.    Follow Up Recommendations  CIR     Does the patient have the potential to tolerate intense rehabilitation     Barriers to Discharge        Equipment Recommendations  Other (comment)    Recommendations for Other Services    Frequency Min 4X/week   Plan Discharge plan remains appropriate;Frequency remains appropriate    Precautions / Restrictions Precautions Precautions: Fall Restrictions Weight Bearing Restrictions: No   Pertinent Vitals/Pain     Mobility  Bed Mobility Bed Mobility: Not assessed Transfers Transfers: Sit to Stand;Stand to Sit Sit to Stand: 1: +2 Total assist;With upper extremity assist;From chair/3-in-1 Sit to Stand: Patient Percentage:  (to 75%) Stand to Sit: 1: +2 Total assist;With upper extremity assist;To chair/3-in-1 Stand to Sit: Patient Percentage:  (75%) Details for Transfer Assistance: 4 trials working on translation forward, and staying symmetrical.  Incorporating R UE into transfer Ambulation/Gait Ambulation/Gait Assistance: 1: +2 Total assist Ambulation/Gait: Patient Percentage: 20% (or greater) Ambulation Distance (Feet): 5 Feet Assistive device: 2 person hand held assist Ambulation/Gait Assistance Details: needed assist fro w/shift, advancing R LE, truncal stability and translation forward.    Gait Pattern: Step-to pattern;Lateral trunk lean to right;Narrow base of support;Right foot flat Stairs: No Modified Rankin (Stroke Patients Only) Pre-Morbid Rankin Score: No symptoms Modified Rankin: Severe disability    Exercises     PT Diagnosis:    PT Problem List:    PT Treatment Interventions:     PT Goals Acute Rehab PT Goals Time For Goal Achievement: 10/31/12 Potential to Achieve Goals: Good Pt will Sit at [facility] of Bed: with modified independence PT Goal: Sit at [facility] Of Bed - Progress: Progressing toward goal Pt will go Sit to Stand: with min assist PT Goal: Sit to Stand - Progress: Progressing toward goal Pt will go Stand to Sit: with min assist PT Goal: Stand to Sit - Progress: Progressing toward goal Pt will Transfer Bed to Chair/Chair to Bed: with mod assist PT Transfer Goal: Bed to Chair/Chair to Bed - Progress: Goal set today Pt will Ambulate: 16 - 50 feet;with mod assist;with least restrictive assistive device PT Goal: Ambulate - Progress: Not met  Visit Information  Last PT Received On: 10/20/12 Assistance Needed: +2    Subjective Data      Cognition  Cognition Arousal/Alertness: Awake/alert Behavior During Therapy: WFL for tasks assessed/performed Overall Cognitive Status: Impaired/Different from baseline Current Attention Level: Selective Following Commands: Follows one step commands with increased time;Follows one step commands inconsistently    Balance  Balance Balance Assessed: Yes Static Standing Balance Static Standing - Balance Support: During functional activity;Bilateral upper extremity supported Static Standing - Level of Assistance: 4: Min assist;Other (comment) (min guard)  End of Session PT - End of Session Activity Tolerance: Patient tolerated treatment well Patient left: in chair;with call bell/phone within reach;with family/visitor present Nurse Communication: Mobility status   GP     Benjy Kana, Eliseo Gum 10/20/2012, 3:52 PM 10/20/2012  [place] Bing, PT [phone]  (pager)

## 2012-10-20 NOTE — Interval H&P Note (Signed)
Tiffany [name] was admitted today to Inpatient Rehabilitation with the diagnosis of embolic left MCA infarct.  The patient's history has been reviewed, patient examined, and there is no change in status.  Patient continues to be appropriate for intensive inpatient rehabilitation.  I have reviewed the patient's chart and labs.  Questions were answered to the patient's satisfaction.  Nehemie Casserly T 10/20/2012, 6:38 PM

## 2012-10-21 ENCOUNTER — Inpatient Hospital Stay (HOSPITAL_COMMUNITY): Payer: BC Managed Care – PPO | Admitting: Physical Therapy

## 2012-10-21 ENCOUNTER — Inpatient Hospital Stay (HOSPITAL_COMMUNITY): Payer: BC Managed Care – PPO | Admitting: *Deleted

## 2012-10-21 ENCOUNTER — Inpatient Hospital Stay (HOSPITAL_COMMUNITY): Payer: BC Managed Care – PPO | Admitting: Speech Pathology

## 2012-10-21 DIAGNOSIS — I6330 Cerebral infarction due to thrombosis of unspecified cerebral artery: Secondary | ICD-10-CM

## 2012-10-21 MED ORDER — ENSURE PUDDING PO PUDG
1.0000 | Freq: Three times a day (TID) | ORAL | Status: DC
  Administered 2012-10-21 – 2012-11-06 (×24): 1 via ORAL

## 2012-10-21 NOTE — Progress Notes (Signed)
Patient ID: Tiffany [name], female   DOB: 04/13/1951, 62 y.o.   MRN: [id number] Subjective/Complaints: 62 y.o. RH- female with history of CAD, HTN, TIA event a month ago; who developed difficulty speaking with right sided weakness on am of 10/16/12. CCT with bilateral subcortical hypodensities and patient treated with TPA. Carotid dopplers with <80% right ICA stenosis. 2 D echo with EF 60-65% and grade one dysfunction. Around midnight on 04/29 patient with progression of symptoms with dense right hemiparesis with inability to follow commands. Follow up CCT with developing focus of low attenuation left insular cortex and deep left frontal white matter, no hemorrhage. MRI brain done revealing L-MCA territory infarct affecting insula, operculum, left motor strip, superior parietal lobe, and left splenium. MRA brain with LMCA occlusion in M1 segment with no distal flow signal. 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 rate 18, height 5\' 7"  (1.702 m), weight 82 kg (180 lb 12.4 oz), SpO2 97.00%. No results found. No results found for this or any previous visit (from the past 72 hour(s)).   HEENT: normal Cardio: RRR and no murmur Resp: CTA B/L and unlabored GI: BS positive and non tender Extremity:  Pulses positive and No Edema Skin:   Intact Neuro: Flat, Cranial Nerve Abnormalities R central 7, Abnormal Sensory difficult to assess secondary to speech, Abnormal Motor 2-/5 in RUE, 3-/5 RLE, 5/5 on Left side, Abnormal FMC Ataxic/ dec FMC and Aphasic Musc/Skel:  Normal Gen NAD.  Names 1/2 simple objects, states first name and maiden name   Assessment/Plan: 1. 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 Physiatrist is providing close team supervision and 24 hour management of active medical problems listed below. Physiatrist and rehab team continue to assess barriers to discharge/monitor patient progress toward functional and medical goals. FIM:                   Comprehension Comprehension Mode: Auditory Comprehension: 5-Understands complex 90% of the time/Cues < 10% of the time  Expression Expression Mode: Verbal Expression: 5-Expresses basic needs/ideas: With extra time/assistive device  Social Interaction Social Interaction: 5-Interacts appropriately 90% of the time - Needs monitoring or encouragement for participation or interaction.  Problem Solving Problem Solving: 4-Solves basic 75 - 89% of the time/requires cueing 10 - 24% of the time  Memory Memory: 4-Recognizes or recalls 75 - 89% of the time/requires cueing 10 - 24% of the time  Medical Problem List and Plan:  1. DVT Prophylaxis/Anticoagulation: Pharmaceutical: Lovenox  2. Pain Management: Will resume norco prn for headaches.  3. Depression: Occasionally crying/anxious during exam. Will resume Effexor and klonopin as at home. Team to continue to provide Ego support and encouragement. Will have LCSW and psychologist follow for support.  4. Neuropsych: This patient is capable of making decisions on her own behalf.  5. HTN: will monitor with bid checks. Continue labetalol. Titrate medications as needed for better control.  6. Dyslipidemia: Continue Lipitor.  7. CAD: Will continue lipitor and ASA. Monitor for symptoms with activity.  8. Dysphagia: Needs full supervision at meals. Will continue Modified diet per ST recommendations   LOS (Days) 1 A FACE TO FACE EVALUATION WAS PERFORMED  Tiffany [name] E 10/21/2012, 10:10 AM

## 2012-10-21 NOTE — Evaluation (Signed)
Occupational Therapy Assessment and Plan  Patient Details  Name: Tiffany [name] MRN: [id number] Date of Birth: 1951/02/01  OT Diagnosis: abnormal posture and flaccid hemiplegia and hemiparesis Rehab Potential: Rehab Potential: Good ELOS: 2 weeks   Today's Date: 10/21/2012 Time:  -   0900-1000  (60 min)    Problem List: There are no active problems to display for this patient.   Past Medical History:  Past Medical History  Diagnosis Date  . Coronary artery disease   . Hypertension   . TIA (transient ischemic attack)   . High cholesterol   . MI (myocardial infarction)   . Stroke   . Arthritis    Past Surgical History:  Past Surgical History  Procedure Laterality Date  . Cardiac surgery    . Tee without cardioversion N/A 10/18/2012    Procedure: TRANSESOPHAGEAL ECHOCARDIOGRAM (TEE);  Surgeon: Vesta Mixer, MD;  Location: [facility] ENDOSCOPY;  Service: Cardiovascular;  Laterality: N/A;  . Coronary artery bypass graft      Assessment & Plan Clinical Impression:Tiffany [name] is a 62 y.o. RH- female with history of CAD, HTN, TIA event a month ago; who developed difficulty speaking with right sided weakness on am of 10/16/12. CCT with bilateral subcortical hypodensities and patient treated with TPA. Carotid dopplers with <80% right ICA stenosis. 2 D echo with EF 60-65% and grade one dysfunction. Around midnight on 04/29 patient with progression of symptoms with dense right hemiparesis with inability to follow commands. Follow up CCT with developing focus of low attenuation left insular cortex and deep left frontal white matter, no hemorrhage. MRI brain done revealing L-MCA territory infarct affecting insula, operculum, left motor strip, superior parietal lobe, and left splenium. MRA brain with LMCA occlusion in M1 segment with no distal flow signal. 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    Patient transferred to CIR on 10/20/2012 .     Patient currently requires max with basic self-care skills secondary to impaired timing and sequencing, abnormal tone and unbalanced muscle activation and decreased attention to right.  Prior to hospitalization, patient could complete BADL with independent .  Patient will benefit from skilled intervention to increase independence with basic self-care skills prior to discharge home with care partner.  Anticipate patient will require intermittent supervision and follow up home health.  OT - End of Session Activity Tolerance: Tolerates 10 - 20 min activity with multiple rests Endurance Deficit: Yes OT Assessment Rehab Potential: Good Barriers to Discharge: None OT Plan OT Intensity: Minimum of 1-2 x/day, 45 to 90 minutes OT Frequency: 5 out of 7 days OT Duration/Estimated Length of Stay: 2 weeks OT Treatment/Interventions: Balance/vestibular training;Discharge planning;Disease mangement/prevention;Functional electrical stimulation;Functional mobility training;Neuromuscular re-education;Patient/family education;Self Care/advanced ADL retraining;Splinting/orthotics;Therapeutic Activities;Therapeutic Exercise;UE/LE Strength taining/ROM;UE/LE Coordination activities;Visual/perceptual remediation/compensation;Wheelchair propulsion/positioning   Skilled Therapeutic Intervention Addressed transfers, sitting balance, standing balance, RUE weight bearing, attention to the right.  Pt. Showered on day 1 with max assist transfer to shower bench.  Pt. Adhered to verbal directions and maintained static sitting with supervision.  Noted mild inattention to the right side.  OT Evaluation Precautions/Restrictions  Precautions Precautions: Fall Restrictions Weight Bearing Restrictions: No General   Vital Signs  SpO2: 94 % O2 Device: None (Room air) Pain Pain Assessment Pain Score: 0-No pain Pain Type: Acute pain Pain Location: Leg Pain Descriptors: Aching Home Living/Prior Functioning Home  Living Lives With: Spouse Available Help at Discharge: Family Type of Home: Mobile home Home Access: Stairs to  enter Entrance Stairs-Number of Steps: 5-8 Entrance Stairs-Rails: Left;Right Home Layout: One level Bathroom Shower/Tub: Walk-in shower;Door [employer] Toilet: Standard Bathroom Accessibility: Yes How Accessible: Accessible via wheelchair;Accessible via walker Home Adaptive Equipment: Walker - rolling Additional Comments: Pt. responds to yes/no question 5/7 times IADL History Homemaking Responsibilities: Yes Meal Prep Responsibility: Secondary Laundry Responsibility: Secondary Cleaning Responsibility: Secondary Bill Paying/Finance Responsibility: No Shopping Responsibility: Secondary Child Care Responsibility: No Current License: Yes Mode of Transportation: Car Occupation: Full time employment Type of Occupation: Engineer, civil (consulting) Prior Function Level of Independence: Independent with homemaking with ambulation Able to Take Stairs?: Yes Driving: Yes Vocation: Full time employment ADL ADL Grooming: Minimal assistance Where Assessed-Grooming: Other (comment) Upper Body Bathing: Moderate assistance Where Assessed-Upper Body Bathing: Shower Lower Body Bathing: Moderate assistance Where Assessed-Lower Body Bathing: Shower Upper Body Dressing: Maximal assistance Where Assessed-Upper Body Dressing: Wheelchair Lower Body Dressing: Maximal assistance Where Assessed-Lower Body Dressing: Wheelchair Toileting: Maximal assistance Where Assessed-Toileting: Bedside Commode Toilet Transfer: Moderate assistance Toilet Transfer Method: Stand pivot Tub/Shower Transfer: Moderate assistance Tub/Shower Transfer Method: Stand pivot Tub/Shower Equipment: Transfer tub bench;Grab bars;Walk in shower Walk-In Shower Transfer: Moderate assistance Film/video editor Method: Warden/ranger: Transfer tub bench;Grab bars Vision/Perception  Vision - History Baseline Vision:  Wears glasses only for reading Vision - Assessment Eye Alignment: Within Functional Limits Vision Assessment: Vision not tested Ocular Range of Motion: Within Functional Limits Perception Perception: Impaired Inattention/Neglect: Impaired-to be further tested in functional context Praxis Praxis: Intact  Cognition Overall Cognitive Status: Impaired/Different from baseline Arousal/Alertness: Awake/alert Orientation Level: Oriented X4 Attention: Selective Focused Attention: Appears intact Sustained Attention: Appears intact Selective Attention: Appears intact Selective Attention Impairment: Functional basic Memory: Appears intact Awareness: Appears intact Awareness Impairment: Emergent impairment Problem Solving: Impaired Problem Solving Impairment: Functional basic Safety/Judgment: Appears intact Comments: questionable mild right inattention Sensation Sensation Light Touch: Appears Intact Coordination Gross Motor Movements are Fluid and Coordinated: No Fine Motor Movements are Fluid and Coordinated: No Motor  Motor Motor: Hemiplegia Motor - Skilled Clinical Observations: RUE [place] Stage 2 Mobility  Transfers Sit to Stand: 2: Max assist;With upper extremity assist;From bed Sit to Stand Details: Tactile cues for initiation;Verbal cues for technique;Manual facilitation for placement;Manual facilitation for weight shifting Stand to Sit: 3: Mod assist Stand to Sit Details (indicate cue type and reason): Tactile cues for initiation;Verbal cues for technique;Manual facilitation for placement;Manual facilitation for weight shifting  Trunk/Postural Assessment  Cervical Assessment Cervical Assessment: Within Functional Limits Thoracic Assessment Thoracic Assessment: Within Functional Limits Lumbar Assessment Lumbar Assessment: Within Functional Limits Postural Control Postural Control: Deficits on evaluation Protective Responses:  (decreased trunk control in dynamic sitting  activities)  Balance Balance Balance Assessed: Yes Static Sitting Balance Static Sitting - Balance Support: Left upper extremity supported;Feet supported Static Sitting - Level of Assistance: 5: Stand by assistance Dynamic Sitting Balance Dynamic Sitting - Balance Support: Left upper extremity supported;Feet supported Dynamic Sitting - Level of Assistance: 4: Min assist Static Standing Balance Static Standing - Balance Support: Bilateral upper extremity supported;During functional activity Static Standing - Level of Assistance: 4: Min assist Extremity/Trunk Assessment RUE Assessment RUE Assessment: Exceptions to [facility] RUE Tone RUE Tone Comments: increased tone in elbow flexion, shoulder flexion LUE Assessment LUE Assessment: Within Functional Limits  FIM:  See FIM      Refer to Care Plan for Long Term Goals  Recommendations for other services: None  Discharge Criteria: Patient will be discharged from OT if patient refuses treatment 3 consecutive times without medical reason, if treatment  goals not met, if there is a change in medical status, if patient makes no progress towards goals or if patient is discharged from hospital.  The above assessment, treatment plan, treatment alternatives and goals were discussed and mutually agreed upon: by family  Humberto Seals 10/21/2012, 7:00 PM

## 2012-10-21 NOTE — Evaluation (Signed)
Physical Therapy Assessment and Plan  Patient Details  Name: Tiffany [name] MRN: [id number] Date of Birth: 04-21-1951  PT Diagnosis: Abnormality of gait, Hemiparesis dominant and Muscle weakness Rehab Potential: Good ELOS: 2 weeks   Today's Date: 10/21/2012 Time: 1300-1400 Time Calculation (min): 60 min  Problem List: There are no active problems to display for this patient.   Past Medical History:  Past Medical History  Diagnosis Date  . Coronary artery disease   . Hypertension   . TIA (transient ischemic attack)   . High cholesterol   . MI (myocardial infarction)   . Stroke   . Arthritis    Past Surgical History:  Past Surgical History  Procedure Laterality Date  . Cardiac surgery    . Tee without cardioversion N/A 10/18/2012    Procedure: TRANSESOPHAGEAL ECHOCARDIOGRAM (TEE);  Surgeon: Vesta Mixer, MD;  Location: [facility] ENDOSCOPY;  Service: Cardiovascular;  Laterality: N/A;  . Coronary artery bypass graft      Assessment & Plan Clinical Impression: Tiffany [name] is a 62 y.o. RH- female with history of CAD, HTN, TIA event a month ago; who developed difficulty speaking with right sided weakness on am of 10/16/12. CCT with bilateral subcortical hypodensities and patient treated with TPA. Carotid dopplers with <80% right ICA stenosis. 2 D echo with EF 60-65% and grade one dysfunction. Around midnight on 04/29 patient with progression of symptoms with dense right hemiparesis with inability to follow commands. Follow up CCT with developing focus of low attenuation left insular cortex and deep left frontal white matter, no hemorrhage. MRI brain done revealing L-MCA territory infarct affecting insula, operculum, left motor strip, superior parietal lobe, and left splenium. MRA brain with LMCA occlusion in M1 segment with no distal flow signal. 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  TEE done showed no thrombus, ASD or PFO. Dr.  Corliss Skains was consulted for input on endovascular revascularization/stenting of R-ICA stenosis and patient/family have agreed to undergo procedure just prior to discharge from Rehab. MBS done revealing oral dysphagia with sensory motor deficits and patient placed on D2, thin liquids. Patient continues to be limited by aphasia with dysphagia as well as right sided weakness as well with inattention of RUE. Therapy working on pre-gait activities and recommended CIR for progressive therapies.   Patient transferred to CIR on 10/20/2012 .   Patient currently requires max with mobility secondary to muscle weakness and muscle paralysis and impaired timing and sequencing, abnormal tone and decreased coordination.  Prior to hospitalization, patient was independent  with mobility and lived with Spouse in a Mobile home home.  Home access is 5-8Stairs to enter.  Patient will benefit from skilled PT intervention to maximize safe functional mobility, minimize fall risk and decrease caregiver burden for planned discharge home with 24 hour supervision.  Anticipate patient will benefit from follow up HH at discharge.  PT - End of Session Activity Tolerance: Tolerates 30+ min activity with multiple rests Endurance Deficit: Yes PT Assessment Rehab Potential: Good Barriers to Discharge: None PT Plan PT Intensity: Minimum of 1-2 x/day ,45 to 90 minutes PT Frequency: 5 out of 7 days PT Duration Estimated Length of Stay: 2 weeks PT Treatment/Interventions: Ambulation/gait training;Balance/vestibular training;DME/adaptive equipment instruction;Functional mobility training;Neuromuscular re-education;Patient/family education;Stair training;Therapeutic Activities;Therapeutic Exercise;UE/LE Strength taining/ROM;UE/LE Coordination activities PT Recommendation Follow Up Recommendations: Home health PT Patient destination: Home  Skilled Therapeutic Intervention   PT  Evaluation Precautions/Restrictions Precautions Precautions: Fall Restrictions Weight Bearing Restrictions: No  General Chart Reviewed: Yes Family/Caregiver Present: No Pain Pain Assessment Pain Assessment: 0-10 Pain Score:   2 Pain Type: Chronic pain Pain Location: Back Multiple Pain Sites: No Home Living/Prior Functioning Home Living Lives With: Spouse Available Help at Discharge: Family Type of Home: Mobile home Home Access: Stairs to enter Secretary/administrator of Steps: 5-8 Entrance Stairs-Rails: Left;Right Home Layout: One level Bathroom Shower/Tub: Health visitor: Standard Bathroom Accessibility: Yes How Accessible: Accessible via wheelchair;Accessible via walker Home Adaptive Equipment: Walker - rolling Prior Function Able to Take Stairs?: Yes Driving: Yes Vocation: Full time employment Vision/Perception  Vision - History Baseline Vision: Wears glasses only for reading Vision - Assessment Eye Alignment: Within Functional Limits  Cognition Overall Cognitive Status: Impaired/Different from baseline Arousal/Alertness: Awake/alert Orientation Level: Oriented X4 Attention: Selective Focused Attention: Appears intact Sustained Attention: Appears intact Selective Attention: Appears intact Memory: Appears intact (recalled events from previous therapy) Awareness: Appears intact Awareness Impairment: Emergent impairment Problem Solving: Impaired Problem Solving Impairment: Functional basic (impacted by apraxia) Initiating: Appears intact Safety/Judgment: Appears intact Comments: questionable mild right inattention Sensation Sensation Light Touch: Appears Intact Coordination Heel Shin Test: impaired due to cognition: ability to follow commands and due to Right sided weakness. Motor  Motor Motor: Hemiplegia  Mobility Bed Mobility Bed Mobility: Rolling Right;Right Sidelying to Sit;Left Sidelying to Sit;Sitting - Scoot to [place] of Bed;Sit to  Supine;Scooting to [facility] Rolling Right: 4: Min assist;With rail Rolling Right Details: Tactile cues for initiation;Verbal cues for technique;Manual facilitation for placement Rolling Left: With rail;4: Min assist Rolling Left Details: Tactile cues for initiation;Verbal cues for technique;Manual facilitation for placement Left Sidelying to Sit: 3: Mod assist Left Sidelying to Sit Details: Tactile cues for initiation;Verbal cues for technique;Manual facilitation for placement Sitting - Scoot to Edge of Bed: 3: Mod assist Scooting to HOB: 1: +1 Total assist Transfers Sit to Stand: 2: Max assist;With upper extremity assist;From bed Sit to Stand Details: Tactile cues for initiation;Verbal cues for technique;Manual facilitation for placement;Manual facilitation for weight shifting Stand to Sit: 2: Max assist;With upper extremity assist;To chair/3-in-1 Stand to Sit Details (indicate cue type and reason): Tactile cues for initiation;Verbal cues for technique;Manual facilitation for placement;Manual facilitation for weight shifting Stand Pivot Transfers: 2: Max assist Stand Pivot Transfer Details: Tactile cues for initiation;Verbal cues for technique;Manual facilitation for placement;Manual facilitation for weight shifting Locomotion  Ambulation Ambulation: Yes Ambulation/Gait Assistance: 1: +2 Total assist Ambulation Distance (Feet): 5 Feet Assistive device: 2 person hand held assist Ambulation/Gait Assistance Details: Tactile cues for initiation;Verbal cues for technique;Manual facilitation for placement;Manual facilitation for weight shifting Ambulation/Gait Assistance Details: needing assist for postural support and to advance R LE  Gait Gait Pattern: Step-to pattern;Lateral trunk lean to right;Narrow base of support Stairs / Additional Locomotion Stairs: No Wheelchair Mobility Wheelchair Mobility: No  Trunk/Postural Assessment  Postural Control Postural Control: Deficits on evaluation  (flexed trunk and mild lean toward right)  Balance Balance Balance Assessed: Yes Static Sitting Balance Static Sitting - Balance Support: Left upper extremity supported;Feet supported Static Sitting - Level of Assistance: 5: Stand by assistance Dynamic Sitting Balance Dynamic Sitting - Balance Support: Left upper extremity supported;Feet supported Dynamic Sitting - Level of Assistance: 4: Min assist Static Standing Balance Static Standing - Balance Support: Bilateral upper extremity supported;During functional activity Static Standing - Level of Assistance: 4: Min assist Static Standing - Comment/# of Minutes: 1 minute Extremity Assessment  RLE Assessment RLE Assessment: Exceptions to [facility] (R hip and knee 2/5, ankle 1+/5) LLE Assessment LLE  Assessment: Within Functional Limits  FIM:  FIM - Banker Devices: Bed rails Bed/Chair Transfer: 1: Two helpers FIM - Locomotion: Ambulation Ambulation/Gait Assistance: 1: +2 Total assist   Refer to Care Plan for Long Term Goals  Recommendations for other services: None  Discharge Criteria: Patient will be discharged from PT if patient refuses treatment 3 consecutive times without medical reason, if treatment goals not met, if there is a change in medical status, if patient makes no progress towards goals or if patient is discharged from hospital.  The above assessment, treatment plan, treatment alternatives and goals were discussed and mutually agreed upon: by patient  Rex Kras 10/21/2012, 5:32 PM

## 2012-10-21 NOTE — Progress Notes (Signed)
Physical Therapy Session Note  Patient Details  Name: Tiffany [name] MRN: [id number] Date of Birth: 1950-10-01  Today's Date: 10/21/2012 Time: 1430-1500 Time Calculation (min): 30 min  Skilled Therapeutic Interventions/Progress Updates:  Ambulation/gait training;Balance/vestibular training;DME/adaptive equipment instruction;Functional mobility training;Neuromuscular re-education;Patient/family education;Stair training;Therapeutic Activities;Therapeutic Exercise;UE/LE Strength taining/ROM;UE/LE Coordination activities   Therapy Documentation Precautions:  Precautions: Fall Restrictions Weight Bearing Restrictions: No Pain: denies any pain this afternoon  Therapeutic Activity:(15') Bed mobility rolling R and L with S/Mod-I using rail, supine to Left sidelying to sitting with S/min-A, Transfer sitting EOB to w/c with Mod-A via stand pivot transfer Therapeutic Exercise:(15') sitting B LE's, and supine R LE manually resisted Therapeutic Exercise  See FIM for current functional status  Therapy/Group: Individual Therapy  Rex Kras 10/21/2012, 5:49 PM

## 2012-10-21 NOTE — Evaluation (Signed)
Speech Language Pathology Assessment and Plan  Patient Details  Name: Tiffany [name] MRN: [id number] Date of Birth: 1950/12/08  SLP Diagnosis: Aphasia;Dysphagia;Dysarthria  Rehab Potential: Excellent ELOS: 3 weeks   Today's Date: 10/21/2012 Time: 1000-1100 Time Calculation (min): 60 min  Skilled Therapeutic Intervention: Administered BSE and cognitive-linguistic evaluation. Please see below for details.   Problem List: There are no active problems to display for this patient.  Past Medical History:  Past Medical History  Diagnosis Date  . Coronary artery disease   . Hypertension   . TIA (transient ischemic attack)   . High cholesterol   . MI (myocardial infarction)   . Stroke   . Arthritis    Past Surgical History:  Past Surgical History  Procedure Laterality Date  . Cardiac surgery    . Tee without cardioversion N/A 10/18/2012    Procedure: TRANSESOPHAGEAL ECHOCARDIOGRAM (TEE);  Surgeon: Vesta Mixer, MD;  Location: [facility] ENDOSCOPY;  Service: Cardiovascular;  Laterality: N/A;  . Coronary artery bypass graft      Assessment / Plan / Recommendation Clinical Impression  Pt is a 62 y.o. RH- female with history of CAD, HTN, TIA event a month ago; who developed difficulty speaking with right sided weakness on morning of 10/16/12. CCT with bilateral subcortical hypodensities and patient treated with TPA. Carotid dopplers with <80% right ICA stenosis. 2 D echo with EF 60-65% and grade one dysfunction. Around midnight on 04/29 patient with progression of symptoms with dense right hemiparesis with inability to follow commands. Follow up CCT with developing focus of low attenuation left insular cortex and deep left frontal white matter, no hemorrhage. MRI brain done revealing L-MCA territory infarct affecting insula, operculum, left motor strip, superior parietal lobe, and left splenium. MRA brain with LMCA occlusion in M1 segment with no distal flow signal. 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 Pt admitted to CIR 10/20/12 and presents with aphasia characterized by moderate expressive and mild receptive impairments. Pt can express basic wants/needs with extra time at the word to phrase level and demonstrates increased word-finding with sentence completion and phonemic cues. Pt with decreased auditory comprehension with complex information and requires intermittent question cues for reading comprehension at the paragraph level. Pt also demonstrates a moderate oral dysphagia characterized by reduced right lingual and labial ROM and strength with moderate oral residue with soft textures impacting pt's ability to safely masticate solid textures, therefore, recommend to continue current diet of Dys. 2 textures with thin liquids with intermittent supervision. Pt also demonstrated mild cognitive impairments characterized by decreased complex problem solving and mild right inattention during functional tasks. Pt would benefit from skilled SLP intervention to maximize functional communication, swallowing function with least restrictive diet and cognitive recovery to maximize overall functional independence.     SLP Assessment  Patient will need skilled Speech Lanaguage Pathology Services during CIR admission    Recommendations  Diet Recommendations: Dysphagia 2 (Fine chop);Thin liquid Liquid Administration via: Cup;Straw Medication Administration: Crushed with puree Supervision: Patient able to self feed;Intermittent supervision to cue for compensatory strategies Compensations: Slow rate;Small sips/bites;Check for pocketing;Check for anterior loss Postural Changes and/or Swallow Maneuvers: Seated upright 90 degrees;Upright 30-60 min after meal Oral Care Recommendations: Oral care BID Patient destination: Home Follow up Recommendations: Outpatient SLP Equipment Recommended: None recommended by SLP    SLP Frequency 5 out of 7 days   SLP  Treatment/Interventions Cognitive remediation/compensation;Cueing hierarchy;Dysphagia/aspiration precaution training;Functional tasks;Patient/family education;Therapeutic Activities;Speech/Language facilitation;Therapeutic Exercise;Oral  motor exercises;Neuromuscular electrical stimulation;Internal/external aids;Environmental controls    Pain Pain Assessment Pain Assessment: No/denies pain Pain Score: 0-No pain Prior Functioning Type of Home: Mobile home Lives With: Spouse Available Help at Discharge: Family Vocation: Full time employment  Short Term Goals: Week 1: SLP Short Term Goal 1 (Week 1): Pt will consume trials of Dys. 3 textures and demonstrate efficient mastication without overt s/s of aspiration with Min verbal cues. SLP Short Term Goal 2 (Week 1): Pt will utilize swallowing compensatory strategies to minimize overt s/s of aspiration with current diet with Mod I.  SLP Short Term Goal 3 (Week 1): Pt will follow 2 step commands with Min A verbal and question cues with 80% accuracy.  SLP Short Term Goal 4 (Week 1): Pt will verbalize wants/needs at the phrase level with Mod A multimodal cueing.  SLP Short Term Goal 5 (Week 1): Pt will perform oral-motor exercises to increase lingual and labial ROM and strength with supervision verbal cues.  SLP Short Term Goal 6 (Week 1): Pt will attend to right field of enviornment during functional tasks with supervision verbal cues.   See FIM for current functional status Refer to Care Plan for Long Term Goals  Recommendations for other services: None  Discharge Criteria: Patient will be discharged from SLP if patient refuses treatment 3 consecutive times without medical reason, if treatment goals not met, if there is a change in medical status, if patient makes no progress towards goals or if patient is discharged from hospital.  The above assessment, treatment plan, treatment alternatives and goals were discussed and mutually agreed upon: by  patient and by family  Tiffany [name] 10/21/2012, 11:23 AM

## 2012-10-21 NOTE — Progress Notes (Signed)
INITIAL NUTRITION ASSESSMENT  DOCUMENTATION CODES Per approved criteria  -Not Applicable   INTERVENTION: 1.  Supplements; continue Magic cups daily with trays.  Order Ensure pudding to be given with medications TID, each supplement provides 170 kcal and 4 grams of protein.   NUTRITION DIAGNOSIS: Inadequate oral intake related to dislike of therapeutic diet as evidenced by pt report.   Goal: Pt to meet >/=90% estimated needs PO  Monitor:  PO intake, wt, I/Os  Reason for Assessment: MST  62 y.o. female  Admitting Dx: CVA  ASSESSMENT: Pt admitted to rehab s/p stroke with residual right-sided weakness. Pt seen and assessed by SLP this am who downgraded texture to Dysphagia 2. RD met with pt during lunch at which time pt has refused meal due to texture.  RD discussed therapeutic diet with pt and reason for transition to D2 diet.  Pt stated understanding of diet.  RD to order supplements.   Height: Ht Readings from Last 1 Encounters:  10/20/12 5\' 7"  (1.702 m)    Weight: Wt Readings from Last 1 Encounters:  10/20/12 180 lb 12.4 oz (82 kg)    Ideal Body Weight: 135 lbs  % Ideal Body Weight: 133%  Wt Readings from Last 10 Encounters:  10/20/12 180 lb 12.4 oz (82 kg)  10/16/12 189 lb 13.1 oz (86.1 kg)  10/16/12 189 lb 13.1 oz (86.1 kg)    Usual Body Weight: pt unable to state  % Usual Body Weight: unknown  BMI:  Body mass index is 28.31 kg/(m^2).  Estimated Nutritional Needs: Kcal: 1880-2050 Protein: 65-82g Fluid: >1.8 L/day  Skin: intact  Diet Order: Dysphagia 2, thin  EDUCATION NEEDS: -Education needs addressed   Intake/Output Summary (Last 24 hours) at 10/21/12 1437 Last data filed at 10/21/12 0900  Gross per 24 hour  Intake    360 ml  Output      0 ml  Net    360 ml    Last BM: 5/2  Labs:   Recent Labs Lab 10/16/12 1000 10/16/12 1010  NA 138 139  K 4.3 4.2  CL 101 105  CO2 25  --   BUN 10 9  CREATININE 0.83 0.90  CALCIUM 9.7  --    GLUCOSE 116* 115*    CBG (last 3)  No results found for this basename: GLUCAP,  in the last 72 hours  Scheduled Meds: . antiseptic oral rinse  15 mL Mouth Rinse BID  . aspirin EC  325 mg Oral Daily  . atorvastatin  40 mg Oral q1800  . clopidogrel  75 mg Oral Q breakfast  . enoxaparin (LOVENOX) injection  40 mg Subcutaneous Q24H  . loratadine  10 mg Oral Daily  . nicotine  14 mg Transdermal Daily  . pantoprazole  40 mg Oral Daily  . venlafaxine  25 mg Oral TID WC    Continuous Infusions:   Past Medical History  Diagnosis Date  . Coronary artery disease   . Hypertension   . TIA (transient ischemic attack)   . High cholesterol   . MI (myocardial infarction)   . Stroke   . Arthritis     Past Surgical History  Procedure Laterality Date  . Cardiac surgery    . Tee without cardioversion N/A 10/18/2012    Procedure: TRANSESOPHAGEAL ECHOCARDIOGRAM (TEE);  Surgeon: Vesta Mixer, MD;  Location: [facility] ENDOSCOPY;  Service: Cardiovascular;  Laterality: N/A;  . Coronary artery bypass graft      Loyce Dys, MS RD LDN Clinical  Inpatient Dietitian Pager: [phone] Weekend/After hours pager: [phone]

## 2012-10-22 ENCOUNTER — Inpatient Hospital Stay (HOSPITAL_COMMUNITY): Payer: BC Managed Care – PPO | Admitting: *Deleted

## 2012-10-22 NOTE — Progress Notes (Signed)
Occupational Therapy Note  Patient Details  Name: Tiffany [name] MRN: [id number] Date of Birth: 1950-07-14 Today's Date: 10/22/2012  Time:  1600-1700  (60 min) Individual session Pain:  None  Addressed toilet transfers, sit to stand, standing balance, RUE NMRE.  Pt. Transferred to toilet with mod assist with cues manual and verbal to pivot and reach for rail.  Transferred to mat and sat with supervision sitting balance for static.  Engaged in RUE weight bearing and gross movements with moderate assist.  With guidance, pt held cup and placed on table.  She expressed surprise and happiness because she did not know she could move.  Son, Jonny Ruiz present and educated him on assisting pt to utilize RUE in pushing and holding objects.  He verbalized understanding.     Humberto Seals 10/22/2012, 3:14 PM

## 2012-10-22 NOTE — Progress Notes (Signed)
Patient ID: Tiffany [name], female   DOB: [birth year], 62 y.o.   MRN: [id number] Subjective/Complaints: 62 y.o. RH- female with history of CAD, HTN, TIA event a month ago; who developed difficulty speaking with right sided weakness on am of 10/16/12. CCT with bilateral subcortical hypodensities and patient treated with TPA. Carotid dopplers with <80% right ICA stenosis. 2 D echo with EF 60-65% and grade one dysfunction. Around midnight on 04/29 patient with progression of symptoms with dense right hemiparesis with inability to follow commands. Follow up CCT with developing focus of low attenuation left insular cortex and deep left frontal white matter, no hemorrhage. MRI brain done revealing L-MCA territory infarct affecting insula, operculum, left motor strip, superior parietal lobe, and left splenium. MRA brain with LMCA occlusion in M1 segment with no distal flow signal. Neurology felt that the patient had an embolic CVA Occ phrases "I had good PT session" Review of Systems  Unable to perform ROS: medical condition   Objective: Vital Signs: Blood pressure 120/67, pulse 78, temperature 98.1 F (36.7 C), temperature source Oral, resp. rate 17, height 5\' 7"  (1.702 m), weight 82 kg (180 lb 12.4 oz), SpO2 92.00%. No results found. No results found for this or any previous visit (from the past 72 hour(s)).   HEENT: normal Cardio: RRR and no murmur Resp: CTA B/L and unlabored GI: BS positive and non tender Extremity:  Pulses positive and No Edema Skin:   Intact Neuro: Flat, Cranial Nerve Abnormalities R central 7, Abnormal Sensory difficult to assess secondary to speech, Abnormal Motor 2-/5 in RUE, 3-/5 RLE, 5/5 on Left side, Abnormal FMC Ataxic/ dec FMC and Aphasic Musc/Skel:  Normal Gen NAD.  Names 1/2 simple objects, states first name and maiden name   Assessment/Plan: 1. 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 Physiatrist is providing close team supervision and 24 hour management of active medical problems listed below. Physiatrist and rehab team continue to assess barriers to discharge/monitor patient progress toward functional and medical goals. FIM: FIM - Bathing Bathing Steps Patient Completed: Chest;Right Arm;Abdomen;Front perineal area;Right upper leg;Left upper leg Bathing: 3: Mod-Patient completes 5-7 50f 10 parts or 50-74%  FIM - Upper Body Dressing/Undressing Upper body dressing/undressing: 2: Max-Patient completed 25-49% of tasks FIM - Lower Body Dressing/Undressing Lower body dressing/undressing: 2: Max-Patient completed 25-49% of tasks  FIM - Toileting Toileting steps completed by patient: Performs perineal hygiene Toileting Assistive Devices: Grab bar or rail for support Toileting: 2: Max-Patient completed 1 of 3 steps  FIM - Diplomatic Services operational officer Devices: Psychiatrist Transfers: 2-To toilet/BSC: Max A (lift and lower assist);2-From toilet/BSC: Max A (lift and lower assist)  FIM - Banker Devices: Bed rails Bed/Chair Transfer: 1: Two helpers  FIM - Locomotion: Ambulation Ambulation/Gait Assistance: 1: +2 Total assist  Comprehension Comprehension Mode: Auditory Comprehension: 5-Follows basic conversation/direction: With no assist  Expression Expression Mode: Verbal Expression: 5-Expresses complex 90% of the time/cues < 10% of the time  Social Interaction Social Interaction: 6-Interacts appropriately with others with medication or extra time (anti-anxiety, antidepressant).  Problem Solving Problem Solving: 5-Solves basic problems: With no assist  Memory Memory: 5-Recognizes or recalls 90% of the time/requires cueing < 10% of the time  Medical Problem List and Plan:  1. DVT Prophylaxis/Anticoagulation: Pharmaceutical: Lovenox  2. Pain Management: Will  resume norco prn for headaches.  3. Depression: Occasionally crying/anxious during exam. Will  resume Effexor and klonopin as at home. Team to continue to provide Ego support and encouragement. Will have LCSW and psychologist follow for support.  4. Neuropsych: This patient is capable of making decisions on her own behalf.  5. HTN: will monitor with bid checks. Continue labetalol. Titrate medications as needed for better control.  6. Dyslipidemia: Continue Lipitor.  7. CAD: Will continue lipitor and ASA. Monitor for symptoms with activity.  8. Dysphagia: Needs full supervision at meals. Will continue Modified diet per ST recommendations   LOS (Days) 2 A FACE TO FACE EVALUATION WAS PERFORMED  Tiffany [name] E 10/22/2012, 9:52 AM

## 2012-10-23 ENCOUNTER — Inpatient Hospital Stay (HOSPITAL_COMMUNITY): Payer: BC Managed Care – PPO | Admitting: Occupational Therapy

## 2012-10-23 ENCOUNTER — Inpatient Hospital Stay (HOSPITAL_COMMUNITY): Payer: BC Managed Care – PPO | Admitting: Physical Therapy

## 2012-10-23 ENCOUNTER — Inpatient Hospital Stay (HOSPITAL_COMMUNITY): Payer: BC Managed Care – PPO | Admitting: Speech Pathology

## 2012-10-23 DIAGNOSIS — F32A Depression, unspecified: Secondary | ICD-10-CM | POA: Diagnosis present

## 2012-10-23 DIAGNOSIS — F329 Major depressive disorder, single episode, unspecified: Secondary | ICD-10-CM | POA: Diagnosis present

## 2012-10-23 DIAGNOSIS — E785 Hyperlipidemia, unspecified: Secondary | ICD-10-CM | POA: Diagnosis present

## 2012-10-23 DIAGNOSIS — I639 Cerebral infarction, unspecified: Secondary | ICD-10-CM | POA: Diagnosis present

## 2012-10-23 DIAGNOSIS — I6330 Cerebral infarction due to thrombosis of unspecified cerebral artery: Secondary | ICD-10-CM

## 2012-10-23 LAB — COMPREHENSIVE METABOLIC PANEL
AST: 21 U/L (ref 0–37)
Albumin: 3.2 g/dL — ABNORMAL LOW (ref 3.5–5.2)
Alkaline Phosphatase: 88 U/L (ref 39–117)
Chloride: 101 mEq/L (ref 96–112)
Creatinine, Ser: 0.83 mg/dL (ref 0.50–1.10)
Potassium: 3.7 mEq/L (ref 3.5–5.1)
Total Bilirubin: 0.4 mg/dL (ref 0.3–1.2)
Total Protein: 6.5 g/dL (ref 6.0–8.3)

## 2012-10-23 LAB — CBC WITH DIFFERENTIAL/PLATELET
Eosinophils Absolute: 0.4 10*3/uL (ref 0.0–0.7)
Eosinophils Relative: 4 % (ref 0–5)
HCT: 39.8 % (ref 36.0–46.0)
Lymphs Abs: 4.2 10*3/uL — ABNORMAL HIGH (ref 0.7–4.0)
MCH: 31.4 pg (ref 26.0–34.0)
MCV: 90 fL (ref 78.0–100.0)
Monocytes Absolute: 1.4 10*3/uL — ABNORMAL HIGH (ref 0.1–1.0)
Platelets: 308 10*3/uL (ref 150–400)
RBC: 4.42 MIL/uL (ref 3.87–5.11)

## 2012-10-23 MED ORDER — ASPIRIN 325 MG PO TABS
325.0000 mg | ORAL_TABLET | Freq: Every day | ORAL | Status: DC
  Administered 2012-10-23 – 2012-11-09 (×18): 325 mg via ORAL
  Filled 2012-10-23 (×21): qty 1

## 2012-10-23 MED ORDER — PANTOPRAZOLE SODIUM 40 MG PO PACK
40.0000 mg | PACK | Freq: Every day | ORAL | Status: DC
  Administered 2012-10-23 – 2012-10-24 (×2): 40 mg via ORAL
  Filled 2012-10-23 (×4): qty 20

## 2012-10-23 NOTE — Progress Notes (Signed)
Physical Therapy Session Note  Patient Details  Name: Tiffany [name] MRN: [id number] Date of Birth: Nov 07, 1950  Today's Date: 10/23/2012 Time: 1400-1447 Time Calculation (min): 47 min  Short Term Goals: Week 1:  PT Short Term Goal 1 (Week 1): Patient will be able to perform bed mobility with min-A PT Short Term Goal 2 (Week 1): Patient will be able to perform transfers with min-A PT Short Term Goal 3 (Week 1): Patient will be able to ambulate 20 feet using LRAD with min-A PT Short Term Goal 4 (Week 1): Patient will be able to ascend/descend 4 steps using hand rail with Mod-A  Skilled Therapeutic Interventions/Progress Updates:   Patient performed transfers stand pivot w/c > mat and w/c > bed at end of session with stand pivot to L and R with max A with lifting assistance to stand and for full extension RLE, for lateral weight shifting assistance, verbal cues to advance RLE and for lowering assistance.  Performed NMR in standing with bilat UE support on EVA walker during alternating LE toe taps to front of RW to facilitate lateral and anterior weight shifting, single limb stance control on RLE with use of mirror as visual feedback for full RLE hip and knee extension and activation and coordination of RLE hip and knee flexion <> extension during stance phase and placement of foot with mod A overall and verbal cues to maintain upright trunk posture and assistance to maintain RUE placement on RW.  Performed gait with EVA walker x 25' with max A overall with assistance to control advancement of RW, lateral weight shifting, verbal cues to initiate swing phase RLE, safe placement of RLE and activation of RLE extensors in stance phase and full step length LLE to increase stance time and anterior translation over RLE; fatigued quickly.  Performed stair negotiation up and down 3 short stairs with LUE support on rail and mod-max A overall for lateral weight shifting and verbal cues for safe step to sequence;  patient with good weight acceptance on RLE with no evidence of knee buckling during stance and improved RLE clearance to advance to next step.  Returned to room to bed and performed sit > supine with min A overall.    Therapy Documentation Precautions:  Precautions Precautions: Fall Precaution Comments:  (right hemiparesis) Restrictions Weight Bearing Restrictions: No Vital Signs: Therapy Vitals Temp: 98.4 F (36.9 C) Temp src: Oral Pulse Rate: 92 Resp: 19 BP: 154/78 mmHg Patient Position, if appropriate: Lying Oxygen Therapy SpO2: 91 % O2 Device: None (Room air) Pain: Pain Assessment Pain Assessment: No/denies pain  See FIM for current functional status  Therapy/Group: Individual Therapy  Edman Circle [facility] 10/23/2012, 3:03 PM

## 2012-10-23 NOTE — Progress Notes (Signed)
Social Work Assessment and Plan Social Work Assessment and Plan  Patient Details  Name: Tiffany [name] MRN: [id number] Date of Birth: 1950-09-11  Today's Date: 10/23/2012  Problem List: There are no active problems to display for this patient.  Past Medical History:  Past Medical History  Diagnosis Date  . Coronary artery disease   . Hypertension   . TIA (transient ischemic attack)   . High cholesterol   . MI (myocardial infarction)   . Stroke   . Arthritis    Past Surgical History:  Past Surgical History  Procedure Laterality Date  . Cardiac surgery    . Tee without cardioversion N/A 10/18/2012    Procedure: TRANSESOPHAGEAL ECHOCARDIOGRAM (TEE);  Surgeon: Vesta Mixer, MD;  Location: [facility] ENDOSCOPY;  Service: Cardiovascular;  Laterality: N/A;  . Coronary artery bypass graft     Social History:  reports that she has been smoking.  She does not have any smokeless tobacco history on file. She reports that she does not drink alcohol or use illicit drugs.  Family / Support Systems Marital Status: Married Patient Roles: Spouse;Parent;Other (Comment) (Consultant) Spouse/Significant Other: Perlie Gold  [phone]-home  [phone]-cell Children: Roza Hedfield-daughter  [phone]-home  [phone]-cell Other Supports: Son-John local Anticipated Caregiver: Husband and daughter, husband is retired Ability/Limitations of Caregiver: Husband is retiredd and in good health Caregiver Availability: 24/7 Family Dynamics: Close knit family who are there for one another.  Husband can assist and plans on building a ramp into hometo make it easier for pt.  Social History Preferred language: English Religion:  Cultural Background: No issues Education: Nursing School-RN Read: Yes Write: Yes Employment Status: Employed Name of Employer: Research scientist (medical) for [employer] Return to Work Plans: Unsure-may need to retire Fish farm manager Issues: No issues Guardian/Conservator: None-according to MD pt  is capbale of making her own decisions   Abuse/Neglect Physical Abuse: Denies Verbal Abuse: Denies Sexual Abuse: Denies Exploitation of patient/patient's resources: Denies Self-Neglect: Denies  Emotional Status Pt's affect, behavior adn adjustment status: Pt is motivated and wants to get as indpendent as possible.  She is encouraged by the progress she has made and hopeful she will continue this improvement while here. Recent Psychosocial Issues: Other medical issues-recent TIA one month ago Pyschiatric History: History of depression-takes meds for and feels they help.  Deferred depression screen due to eating breakfast while interviewing and will monitor while here and see how therapy goes.  May benefit from Neuro-psych eval while here. Substance Abuse History: Tobacco-plans to quit now  Patient / Family Perceptions, Expectations & Goals Pt/Family understanding of illness & functional limitations: Pt and husband can explain her stroke and deficits.  Both are hopeful she will do well and regain as much function as possible while here.  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 Premorbid pt/family roles/activities: Wife, Mother, Rn, Church member, etc Anticipated changes in roles/activities/participation: resume Pt/family expectations/goals: Pt states: " I want to do as much as I can for myself."  Husband states; ' I hope  she does well here, but will do what is needed."  [employer]: None Premorbid Home Care/DME Agencies: None Transportation available at discharge: [employer] referrals recommended: Support group (specify) (CVA Support group)  Discharge Planning Living Arrangements: Spouse/significant other Support Systems: Spouse/significant other;Children;Other relatives;Friends/neighbors;Church/faith community Type of Residence: Private residence Insurance Resources: Media planner (specify) Chief Executive Officer) Financial Resources:  Employment;Family Support Financial Screen Referred: No Living Expenses: Lives with family Money Management: Patient;Spouse Do you have any problems  obtaining your medications?: No Home Management: Both Patient/Family Preliminary Plans: Return home with husband who is able to assist if needed.  He and daughter are involved and paln to come and observe in therapies while here.  Will do what is needed for pt at discharge. Social Work Anticipated Follow Up Needs: HH/OP;Support Group  Clinical Impression Pleasant female who is motivated and willing to do what it takes to regain her independence.  Husband is supportive and involved. Plans on building a ramp for pt prior to discharge.  Work toward discharge plan.  Lucy Chris 10/23/2012, 10:01 AM

## 2012-10-23 NOTE — Progress Notes (Signed)
Occupational Therapy Session Note  Patient Details  Name: Tiffany [name] MRN: [id number] Date of Birth: September 28, 1950  Today's Date: 10/23/2012 Time: 4696-2952 Time Calculation (min): 47 min  Short Term Goals: Week 1:  OT Short Term Goal 1 (Week 1): Pt. will bathe UB at supervision level OT Short Term Goal 2 (Week 1): Pt. will bathe LB at minimal assist level with sit to stand OT Short Term Goal 3 (Week 1): Pt. will use RUE as gross assist level during ADL OT Short Term Goal 4 (Week 1): Pt. will transfer to toilet at minimal assist level  Skilled Therapeutic Interventions/Progress Updates:    Utilized Bioness H200 NMES on the right hand to help facilitate digit extensors and flexors.  Integrated the NMES into active shoulder movements as well.  In open mode worked on sliding her hand up a flat board with washcloth placed underneath the board, to help facilitate shoulder flexion and elbow extension while stimulating digit extension.  Progressed to grasp mode to work on reaching to knee level to pick up and place plastic cups from the left side of the bench to the right, stacking them on top of each other.  Pt able to demonstrate approximately 70% of full gross grasp and release without stimulation and wrist in neutral position at beginning of session.  With wrist placed in 20 degrees of extension, pt only able to demonstrate 20% of grasp and release.  Therapy Documentation Precautions:  Precautions Precautions: Fall Precaution Comments:  (right hemiparesis) Restrictions Weight Bearing Restrictions: No  Pain: Pain Assessment Pain Assessment: No/denies pain  Other Treatments: Treatments Neuromuscular Facilitation: Right Modalities Modalities: Archivist Stimulation Location: Bioness H200 on the RUE Electrical Stimulation Action: To help facilitate right digit flexion and extension Electrical Stimulation Parameters: Duration 6, intensity on  flexors=8 and extensors=9 Electrical Stimulation Goals: Neuromuscular facilitation Weight Bearing Technique Weight Bearing Technique: No  See FIM for current functional status  Therapy/Group: Individual Therapy  Prashant Glosser OTR/L 10/23/2012, 3:03 PM

## 2012-10-23 NOTE — Progress Notes (Signed)
Patient ID: Tiffany [name], female   DOB: 12-21-1950, 62 y.o.   MRN: [id number] Subjective/Complaints: 62 y.o. RH- female with history of CAD, HTN, TIA event a month ago; who developed difficulty speaking with right sided weakness on am of 10/16/12. CCT with bilateral subcortical hypodensities and patient treated with TPA. Carotid dopplers with <80% right ICA stenosis. 2 D echo with EF 60-65% and grade one dysfunction. Around midnight on 04/29 patient with progression of symptoms with dense right hemiparesis with inability to follow commands. Follow up CCT with developing focus of low attenuation left insular cortex and deep left frontal white matter, no hemorrhage. MRI brain done revealing L-MCA territory infarct affecting insula, operculum, left motor strip, superior parietal lobe, and left splenium. MRA brain with LMCA occlusion in M1 segment with no distal flow signal. 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 rate 18, height 5\' 7"  (1.702 m), weight 82 kg (180 lb 12.4 oz), SpO2 94.00%. No results found. Results for orders placed during the hospital encounter of 10/20/12 (from the past 72 hour(s))  COMPREHENSIVE METABOLIC PANEL     Status: Abnormal   Collection Time    10/23/12  5:18 AM      Result Value Range   Sodium 142  135 - 145 mEq/L   Potassium 3.7  3.5 - 5.1 mEq/L   Chloride 101  96 - 112 mEq/L   CO2 34 (*) 19 - 32 mEq/L   Glucose, Bld 102 (*) 70 - 99 mg/dL   BUN 14  6 - 23 mg/dL   Creatinine, Ser 7.82  0.50 - 1.10 mg/dL   Calcium 9.6  8.4 - 95.6 mg/dL   Total Protein 6.5  6.0 - 8.3 g/dL   Albumin 3.2 (*) 3.5 - 5.2 g/dL   AST 21  0 - 37 U/L   ALT 14  0 - 35 U/L   Alkaline Phosphatase 88  39 - 117 U/L   Total Bilirubin 0.4  0.3 - 1.2 mg/dL    GFR calc non Af Amer 74 (*) >90 mL/min   GFR calc Af Amer 86 (*) >90 mL/min   Comment:            The eGFR has been calculated     using the CKD EPI equation.     This calculation has not been     validated in all clinical     situations.     eGFR's persistently     <90 mL/min signify     possible Chronic Kidney Disease.  CBC WITH DIFFERENTIAL     Status: Abnormal   Collection Time    10/23/12  5:18 AM      Result Value Range   WBC 11.4 (*) 4.0 - 10.5 K/uL   RBC 4.42  3.87 - 5.11 MIL/uL   Hemoglobin 13.9  12.0 - 15.0 g/dL   HCT 21.3  08.6 - 57.8 %   MCV 90.0  78.0 - 100.0 fL   MCH 31.4  26.0 - 34.0 pg   MCHC 34.9  30.0 - 36.0 g/dL   RDW 46.9  62.9 - 52.8 %   Platelets 308  150 - 400 K/uL   Neutrophils Relative 48  43 - 77 %   Neutro Abs 5.4  1.7 -  7.7 K/uL   Lymphocytes Relative 37  12 - 46 %   Lymphs Abs 4.2 (*) 0.7 - 4.0 K/uL   Monocytes Relative 12  3 - 12 %   Monocytes Absolute 1.4 (*) 0.1 - 1.0 K/uL   Eosinophils Relative 4  0 - 5 %   Eosinophils Absolute 0.4  0.0 - 0.7 K/uL   Basophils Relative 0  0 - 1 %   Basophils Absolute 0.0  0.0 - 0.1 K/uL     HEENT: normal Cardio: RRR and no murmur Resp: CTA B/L and unlabored GI: BS positive and non tender Extremity:  Pulses positive and No Edema Skin:   Intact Neuro: Flat, Cranial Nerve Abnormalities R central 7, Abnormal Sensory difficult to assess secondary to speech, Abnormal Motor 2-/5 in RUE, 3-/5 RLE, 5/5 on Left side, Abnormal FMC Ataxic/ dec FMC and Aphasic Musc/Skel:  Normal Gen NAD.  Names 1/2 simple objects, states first name and maiden name   Assessment/Plan: 1. 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 Physiatrist is providing close team supervision and 24 hour management of active medical problems listed below. Physiatrist and rehab team continue to assess barriers to discharge/monitor  patient progress toward functional and medical goals. FIM: FIM - Bathing Bathing Steps Patient Completed: Chest;Right Arm;Abdomen;Front perineal area;Right upper leg;Left upper leg Bathing: 3: Mod-Patient completes 5-7 55f 10 parts or 50-74%  FIM - Upper Body Dressing/Undressing Upper body dressing/undressing: 2: Max-Patient completed 25-49% of tasks FIM - Lower Body Dressing/Undressing Lower body dressing/undressing: 2: Max-Patient completed 25-49% of tasks  FIM - Toileting Toileting steps completed by patient: Performs perineal hygiene Toileting Assistive Devices: Grab bar or rail for support Toileting: 1: Two helpers  FIM - Diplomatic Services operational officer Devices: Therapist, music Transfers: 1-Two helpers  FIM - Banker Devices: Bed rails Bed/Chair Transfer: 1: Two helpers  FIM - Locomotion: Ambulation Ambulation/Gait Assistance: 1: +2 Total assist  Comprehension Comprehension Mode: Auditory Comprehension: 5-Follows basic conversation/direction: With no assist  Expression Expression Mode: Verbal Expression: 3-Expresses basic 50 - 74% of the time/requires cueing 25 - 50% of the time. Needs to repeat parts of sentences.  Social Interaction Social Interaction: 5-Interacts appropriately 90% of the time - Needs monitoring or encouragement for participation or interaction.  Problem Solving Problem Solving: 5-Solves basic problems: With no assist  Memory Memory: 5-Recognizes or recalls 90% of the time/requires cueing < 10% of the time  Medical Problem List and Plan:  1. DVT Prophylaxis/Anticoagulation: Pharmaceutical: Lovenox  2. Pain Management: Will resume norco prn for headaches.  3. Depression: Occasionally crying/anxious during exam. Will resume Effexor and klonopin as at home. Team to continue to provide Ego support and encouragement. Will have LCSW and psychologist follow for support.  4. Neuropsych: This patient is  capable of making decisions on her own behalf.  5. HTN: will monitor with bid checks. Continue labetalol. Titrate medications as needed for better control.  6. Dyslipidemia: Continue Lipitor.  7. CAD: Will continue lipitor and ASA. Monitor for symptoms with activity.  8. Dysphagia: Needs full supervision at meals. Will continue Modified diet per ST recommendations   LOS (Days) 3 A FACE TO FACE EVALUATION WAS PERFORMED  Chairty Toman E 10/23/2012, 8:26 AM

## 2012-10-23 NOTE — Progress Notes (Signed)
Occupational Therapy Session Note  Patient Details  Name: Tiffany [name] MRN: [id number] Date of Birth: 05/11/1951  Today's Date: 10/23/2012 Time: 8469-6295 Time Calculation (min): 58 min  Short Term Goals: Week 1:  OT Short Term Goal 1 (Week 1): Pt. will bathe UB at supervision level OT Short Term Goal 2 (Week 1): Pt. will bathe LB at minimal assist level with sit to stand OT Short Term Goal 3 (Week 1): Pt. will use RUE as gross assist level during ADL OT Short Term Goal 4 (Week 1): Pt. will transfer to toilet at minimal assist level  Skilled Therapeutic Interventions/Progress Updates:    Pt performed toileting to start session.  Needed mod assist for stand pivot transfer to toilet and for standing balance while performing perianal hygiene.  Mod assist to take a few steps from the toilet over to the shower bench in the walk-in shower.  Pt performed bathing with max assist to integrate the RUE into the bathing task.  Able to perform sit to stand using rail for therapist to help with washing her bottom, with min assist.  Pt needs mod instructional cueing to follow hemi-techniques for dressing, to begin with the right side first. She demonstrates decreased ability to maintain right knee extension in standing secondary to motor impersistence.    Therapy Documentation Precautions:  Precautions Precautions: Fall Precaution Comments:  (right hemiparesis) Restrictions Weight Bearing Restrictions: No  Pain: Pain Assessment Pain Assessment: No/denies pain ADL: See FIM for current functional status  Therapy/Group: Individual Therapy  Sharmel Ballantine OTR/L 10/23/2012, 12:31 PM

## 2012-10-23 NOTE — Care Management Note (Signed)
[facility] Individual Statement of Services  Patient Name:  Tiffany [name]  Date:  10/23/2012  Welcome to the [facility].  Our goal is to provide you with an individualized program based on your diagnosis and situation, designed to meet your specific needs.  With this comprehensive rehabilitation program, you will be expected to participate in at least 3 hours of rehabilitation therapies Monday-Friday, with modified therapy programming on the weekends.  Your rehabilitation program will include the following services:  Physical Therapy (PT), Occupational Therapy (OT), Speech Therapy (ST), 24 hour per day rehabilitation nursing, Therapeutic Recreaction (TR), Neuropsychology, Case Management ( Social Worker), Rehabilitation Medicine, Nutrition Services and Pharmacy Services  Weekly team conferences will be held on Wednesday to discuss your progress.  Your Social Worker will talk with you frequently to get your input and to update you on team discussions.  Team conferences with you and your family in attendance may also be held.  Expected length of stay: 2 weeks Overall anticipated outcome: supervision/min level  Depending on your progress and recovery, your program may change. Your Social Worker will coordinate services and will keep you informed of any changes. Your Child psychotherapist names and contact numbers are listed  below.  The following services may also be recommended but are not provided by the [facility]:   Driving Evaluations  Home Health Rehabiltiation Services  Outpatient Rehabilitatation [facility]  Vocational Rehabilitation   Arrangements will be made to provide these services after discharge if needed.  Arrangements include referral to agencies that provide these services.  Your insurance has been verified to be:  BCBS Your primary doctor is:  Dr. Carole Binning  Pertinent information will be shared with your doctor and  your insurance company.  Social Worker:  Dossie Der, [place] [phone]  Information discussed with and copy given to patient by: Lucy Chris, 10/23/2012, 8:29 AM

## 2012-10-23 NOTE — Progress Notes (Signed)
Patient information reviewed and entered into eRehab system by Damaria Vachon, RN, CRRN, PPS Coordinator.  Information including medical coding and functional independence measure will be reviewed and updated through discharge.    

## 2012-10-23 NOTE — Progress Notes (Signed)
Speech Language Pathology Daily Session Note  Patient Details  Name: Tiffany [name] MRN: [id number] Date of Birth: 07-21-1950  Today's Date: 10/23/2012 Time: 1100-1130 Time Calculation (min): 30 min  Short Term Goals: Week 1: SLP Short Term Goal 1 (Week 1): Pt will consume trials of Dys. 3 textures and demonstrate efficient mastication without overt s/s of aspiration with Min verbal cues. SLP Short Term Goal 2 (Week 1): Pt will utilize swallowing compensatory strategies to minimize overt s/s of aspiration with current diet with Mod I.  SLP Short Term Goal 3 (Week 1): Pt will follow 2 step commands with Min A verbal and question cues with 80% accuracy.  SLP Short Term Goal 4 (Week 1): Pt will verbalize wants/needs at the phrase level with Mod A multimodal cueing.  SLP Short Term Goal 5 (Week 1): Pt will perform oral-motor exercises to increase lingual and labial ROM and strength with supervision verbal cues.  SLP Short Term Goal 6 (Week 1): Pt will attend to right field of enviornment during functional tasks with supervision verbal cues.   Skilled Therapeutic Interventions: Treatment focus on cognitive-linguistic goals. SLP facilitated session by providing Mod A question, semantic and verbal cues to self-monitor and correct semantic paraphasias and for utilization of word-finding strategies at the sentence level.  Pt also required total A multimodal cueing to appropriately perform oral-motor exercises to increase lingual and labial ROM and strength. Pt demonstrated appropriate recall of previous therapy sessions with Mod I and attend to right upper extremity with Mod I throughout the session.    FIM:  Comprehension Comprehension Mode: Auditory Comprehension: 5-Follows basic conversation/direction: With extra time/assistive device Expression Expression Mode: Verbal Expression: 3-Expresses basic 50 - 74% of the time/requires cueing 25 - 50% of the time. Needs to repeat parts of  sentences. Social Interaction Social Interaction: 6-Interacts appropriately with others with medication or extra time (anti-anxiety, antidepressant). Problem Solving Problem Solving: 4-Solves basic 75 - 89% of the time/requires cueing 10 - 24% of the time Memory Memory: 5-Recognizes or recalls 90% of the time/requires cueing < 10% of the time  Pain Pain Assessment Pain Assessment: No/denies pain  Therapy/Group: Individual Therapy  Honour Schwieger 10/23/2012, 5:27 PM

## 2012-10-24 ENCOUNTER — Encounter (HOSPITAL_COMMUNITY): Payer: BC Managed Care – PPO | Admitting: Occupational Therapy

## 2012-10-24 ENCOUNTER — Inpatient Hospital Stay (HOSPITAL_COMMUNITY): Payer: BC Managed Care – PPO | Admitting: Speech Pathology

## 2012-10-24 ENCOUNTER — Inpatient Hospital Stay (HOSPITAL_COMMUNITY): Payer: BC Managed Care – PPO | Admitting: Physical Therapy

## 2012-10-24 ENCOUNTER — Inpatient Hospital Stay (HOSPITAL_COMMUNITY): Payer: BC Managed Care – PPO

## 2012-10-24 DIAGNOSIS — G8110 Spastic hemiplegia affecting unspecified side: Secondary | ICD-10-CM

## 2012-10-24 DIAGNOSIS — I69920 Aphasia following unspecified cerebrovascular disease: Secondary | ICD-10-CM

## 2012-10-24 NOTE — Progress Notes (Signed)
Occupational Therapy Session Note  Patient Details  Name: Tiffany [name] MRN: [id number] Date of Birth: 05/28/1951  Today's Date: 10/24/2012 Time: 1004-1100 Time Calculation (min): 56 min  Short Term Goals: Week 1:  OT Short Term Goal 1 (Week 1): Pt. will bathe UB at supervision level OT Short Term Goal 2 (Week 1): Pt. will bathe LB at minimal assist level with sit to stand OT Short Term Goal 3 (Week 1): Pt. will use RUE as gross assist level during ADL OT Short Term Goal 4 (Week 1): Pt. will transfer to toilet at minimal assist level  Skilled Therapeutic Interventions/Progress Updates:    Pt performed shower and dressing session.  Pt transferred to the walk-in shower on a shower bench with mod assist stand pivot.  Pt showered with overall mod assist, with max assist to integrate the RUE for washing the LUE.  She needs mod instructional cueing to slow down and try to integrate the RUE into holding the washcloth and soap for soaping the washcloth.  Pt with noted motor apraxia at times when trying to manipulate the hand held shower and when attempting to wash the peri area.  She reached back with her hand but did not attempt to pick up the washcloth until cued to do so.  Performed dressing with mod instructional cueing for hemi-dressing techniques.  Pt continues to try and dress the LUE or LLE first unless cued.  Pt's husband and daughter present for session.  Therapy Documentation Precautions:  Precautions Precautions: Fall Precaution Comments: right hemipareisis, motor apraxia Restrictions Weight Bearing Restrictions: No  Pain: Pain Assessment Pain Assessment: No/denies pain ADL: See FIM for current functional status  Therapy/Group: Individual Therapy  Develle Sievers OTR/L 10/24/2012, 12:16 PM

## 2012-10-24 NOTE — Progress Notes (Signed)
Speech Language Pathology Daily Session Note  Patient Details  Name: Tiffany [name] MRN: [id number] Date of Birth: 11/08/1950  Today's Date: 10/24/2012 Time: 1445-1530 Time Calculation (min): 45 min  Short Term Goals: Week 1: SLP Short Term Goal 1 (Week 1): Pt will consume trials of Dys. 3 textures and demonstrate efficient mastication without overt s/s of aspiration with Min verbal cues. SLP Short Term Goal 2 (Week 1): Pt will utilize swallowing compensatory strategies to minimize overt s/s of aspiration with current diet with Mod I.  SLP Short Term Goal 3 (Week 1): Pt will follow 2 step commands with Min A verbal and question cues with 80% accuracy.  SLP Short Term Goal 4 (Week 1): Pt will verbalize wants/needs at the phrase level with Mod A multimodal cueing.  SLP Short Term Goal 5 (Week 1): Pt will perform oral-motor exercises to increase lingual and labial ROM and strength with supervision verbal cues.  SLP Short Term Goal 6 (Week 1): Pt will attend to right field of enviornment during functional tasks with supervision verbal cues.   Skilled Therapeutic Interventions: Skilled treatment session focused on addressing language goals.  SLP facilitated categorical naming task with supervision level assist semantic and phonemic cues.  Patient also participated in a sentence formation task in which she was provided with 3 words and asked to use them in a sentence; SLP facilitated task with max assist verbal and written cues to compose sentences that where 4-5 words in length.  Patient reported that expressing needs and wants verbally in room was difficult; however, previously given written aid assists her when time/pressure make verbal tasks such as requests for wants and needs more difficult.  Continue with plan of care.   FIM:  Comprehension Comprehension Mode: Auditory Comprehension: 5-Follows basic conversation/direction: With extra time/assistive device Expression Expression Mode:  Verbal Expression: 3-Expresses basic 50 - 74% of the time/requires cueing 25 - 50% of the time. Needs to repeat parts of sentences. Social Interaction Social Interaction: 6-Interacts appropriately with others with medication or extra time (anti-anxiety, antidepressant). Problem Solving Problem Solving: 4-Solves basic 75 - 89% of the time/requires cueing 10 - 24% of the time Memory Memory: 5-Recognizes or recalls 90% of the time/requires cueing < 10% of the time  Pain Pain Assessment Pain Assessment: No/denies pain  Therapy/Group: Individual Therapy  Charlane Ferretti., CCC-SLP [phone]  Tiffany [name] 10/24/2012, 4:24 PM

## 2012-10-24 NOTE — Progress Notes (Signed)
Physical Therapy Session Note  Patient Details  Name: Lauryn Lizardi MRN: [id number] Date of Birth: 1951-02-08  Today's Date: 10/24/2012 Time: 4098-1191 Time Calculation (min): 59 min  Short Term Goals: Week 1:  PT Short Term Goal 1 (Week 1): Patient will be able to perform bed mobility with min-A PT Short Term Goal 2 (Week 1): Patient will be able to perform transfers with min-A PT Short Term Goal 3 (Week 1): Patient will be able to ambulate 20 feet using LRAD with min-A PT Short Term Goal 4 (Week 1): Patient will be able to ascend/descend 4 steps using hand rail with Mod-A  Therapy Documentation Precautions:  Precautions Precautions: Fall Precaution Comments: right hemipareisis, motor apraxia Restrictions Weight Bearing Restrictions: No Pain:  No c/o pain Mobility:  Performed sit  <> stand multiple times from w/c and mat and performed stand pivot transfers w/c <> mat with max A overall with lifting assistance with verbal and tactile cues for full anterior lean and anterior translation of R femur and tibia to bring COG anterior over BOS and for lateral weight shifting for pivoting.  Performed supine <> sit on flat mat, no rails min A overall.   Other Treatments: Treatments Neuromuscular Facilitation: Right;Lower Extremity;Activity to increase coordination;Activity to increase motor control;Activity to increase timing and sequencing;Activity to increase grading;Activity to increase sustained activation;Activity to increase lateral weight shifting;Activity to increase anterior-posterior weight shifting;Forced use first in supine during gravity minimize exercises for sustained activation and motor control training of RLE during ankle pumps, heel slides, quad sets, hip ABD/ADD and bilat bridges x 10 reps each.  Also performed RLE gastroc, hamstring and hip flexor stretches to increase available AROM in standing/WB.  In standing performed L lateral weight shifts during 10-12 reps each forward  and lateral RLE toe taps to 2" step.  Carryover of weight shifting and tapping sequence to R and L lateral stepping along mat x 5' to R and 5' to L with mod-max A for weight shifting and verbal cues for sequencing and to maintain RLE extension in stance.  Patient demonstrating improved terminal hip and knee extension following stretches.    See FIM for current functional status  Therapy/Group: Individual Therapy  Edman Circle [facility] 10/24/2012, 2:07 PM

## 2012-10-24 NOTE — Progress Notes (Signed)
Occupational Therapy Session Note  Patient Details  Name: Tiffany [name] MRN: [id number] Date of Birth: 11-13-1950  Today's Date: 10/24/2012 Time: 1130-1200 Time Calculation (min): 30 min  Short Term Goals: Week 1:  OT Short Term Goal 1 (Week 1): Pt. will bathe UB at supervision level OT Short Term Goal 2 (Week 1): Pt. will bathe LB at minimal assist level with sit to stand OT Short Term Goal 3 (Week 1): Pt. will use RUE as gross assist level during ADL OT Short Term Goal 4 (Week 1): Pt. will transfer to toilet at minimal assist level  Skilled Therapeutic Interventions/Progress Updates:    Therapy session focused on neuro re-ed to RUE, postural control, and  functional transfers. Pt completed squat pivot transfer w/c<>mat to Lt side with mod assist and verbal cues for hand placement. Pt participated in dynamic sitting task with supervision on mat table. Pt participated in reaching activity below shoulder height as she moved items on horizontal plane across midline to increased shoulder abd/add. Pt unable to sustain grip on items and required support at elbow. Participated in grasp/release of thicker item and placed directly in front of her at waist height to increase elbow extension and digit flexion and extension. Cues to extend elbow as pt began compensating with trunk by leaning posteriorly.   Therapy Documentation Precautions:  Precautions Precautions: Fall Precaution Comments:  (right hemiparesis) Restrictions Weight Bearing Restrictions: No General:   Vital Signs:   Pain:   No c/o pain during therapy session.   See FIM for current functional status  Therapy/Group: Individual Therapy  Daneil Dan 10/24/2012, 12:07 PM

## 2012-10-24 NOTE — Progress Notes (Signed)
Patient ID: Fenna Semel, female   DOB: 01/30/1951, 62 y.o.   MRN: [id number] Subjective/Complaints: 62 y.o. RH- female with history of CAD, HTN, TIA event a month ago; who developed difficulty speaking with right sided weakness on am of 10/16/12. CCT with bilateral subcortical hypodensities and patient treated with TPA. Carotid dopplers with <80% right ICA stenosis. 2 D echo with EF 60-65% and grade one dysfunction. Around midnight on 04/29 patient with progression of symptoms with dense right hemiparesis with inability to follow commands. Follow up CCT with developing focus of low attenuation left insular cortex and deep left frontal white matter, no hemorrhage. MRI brain done revealing L-MCA territory infarct affecting insula, operculum, left motor strip, superior parietal lobe, and left splenium. MRA brain with LMCA occlusion in M1 segment with no distal flow signal. 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 rate 18, height 5\' 7"  (1.702 m), weight 82 kg (180 lb 12.4 oz), SpO2 97.00%. No results found. Results for orders placed during the hospital encounter of 10/20/12 (from the past 72 hour(s))  COMPREHENSIVE METABOLIC PANEL     Status: Abnormal   Collection Time    10/23/12  5:18 AM      Result Value Range   Sodium 142  135 - 145 mEq/L   Potassium 3.7  3.5 - 5.1 mEq/L   Chloride 101  96 - 112 mEq/L   CO2 34 (*) 19 - 32 mEq/L   Glucose, Bld 102 (*) 70 - 99 mg/dL   BUN 14  6 - 23 mg/dL   Creatinine, Ser 1.19  0.50 - 1.10 mg/dL   Calcium 9.6  8.4 - 14.7 mg/dL   Total Protein 6.5  6.0 - 8.3 g/dL   Albumin 3.2 (*) 3.5 - 5.2 g/dL   AST 21  0 - 37 U/L   ALT 14  0 - 35 U/L   Alkaline Phosphatase 88  39 - 117 U/L    Total Bilirubin 0.4  0.3 - 1.2 mg/dL   GFR calc non Af Amer 74 (*) >90 mL/min   GFR calc Af Amer 86 (*) >90 mL/min   Comment:            The eGFR has been calculated     using the CKD EPI equation.     This calculation has not been     validated in all clinical     situations.     eGFR's persistently     <90 mL/min signify     possible Chronic Kidney Disease.  CBC WITH DIFFERENTIAL     Status: Abnormal   Collection Time    10/23/12  5:18 AM      Result Value Range   WBC 11.4 (*) 4.0 - 10.5 K/uL   RBC 4.42  3.87 - 5.11 MIL/uL   Hemoglobin 13.9  12.0 - 15.0 g/dL   HCT 82.9  56.2 - 13.0 %   MCV 90.0  78.0 - 100.0 fL   MCH 31.4  26.0 - 34.0 pg   MCHC 34.9  30.0 - 36.0 g/dL   RDW 86.5  78.4 - 69.6 %   Platelets 308  150 - 400 K/uL   Neutrophils Relative 48  43 - 77 %  Neutro Abs 5.4  1.7 - 7.7 K/uL   Lymphocytes Relative 37  12 - 46 %   Lymphs Abs 4.2 (*) 0.7 - 4.0 K/uL   Monocytes Relative 12  3 - 12 %   Monocytes Absolute 1.4 (*) 0.1 - 1.0 K/uL   Eosinophils Relative 4  0 - 5 %   Eosinophils Absolute 0.4  0.0 - 0.7 K/uL   Basophils Relative 0  0 - 1 %   Basophils Absolute 0.0  0.0 - 0.1 K/uL     HEENT: normal Cardio: RRR and no murmur Resp: CTA B/L and unlabored GI: BS positive and non tender Extremity:  Pulses positive and No Edema Skin:   Intact Neuro: Flat, Cranial Nerve Abnormalities R central 7, Abnormal Sensory difficult to assess secondary to speech, Abnormal Motor 2-/5 in RUE, 3-/5 RLE, 5/5 on Left side, Abnormal FMC Ataxic/ dec FMC and Aphasic Musc/Skel:  Normal Gen NAD.  Names 1/2 simple objects, states first name and maiden name   Assessment/Plan: 1. 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 Physiatrist is providing close team supervision and 24 hour management of active medical problems listed below. Physiatrist and rehab team continue to  assess barriers to discharge/monitor patient progress toward functional and medical goals. FIM: FIM - Bathing Bathing Steps Patient Completed: Chest;Right Arm;Abdomen;Front perineal area;Right upper leg;Left upper leg;Right lower leg (including foot);Left lower leg (including foot) Bathing: 4: Min-Patient completes 8-9 22f 10 parts or 75+ percent  FIM - Upper Body Dressing/Undressing Upper body dressing/undressing steps patient completed: Thread/unthread right bra strap;Thread/unthread left bra strap;Thread/unthread right sleeve of pullover shirt/dresss;Pull shirt over trunk;Put head through opening of pull over shirt/dress;Thread/unthread left sleeve of pullover shirt/dress Upper body dressing/undressing: 4: Min-Patient completed 75 plus % of tasks FIM - Lower Body Dressing/Undressing Lower body dressing/undressing steps patient completed: Thread/unthread right pants leg;Thread/unthread left pants leg;Don/Doff right shoe;Don/Doff left shoe Lower body dressing/undressing: 3: Mod-Patient completed 50-74% of tasks  FIM - Toileting Toileting steps completed by patient: Adjust clothing prior to toileting Toileting Assistive Devices: Grab bar or rail for support Toileting: 3: Mod-Patient completed 2 of 3 steps  FIM - Diplomatic Services operational officer Devices: Therapist, music Transfers: 1-Two helpers  FIM - Banker Devices: Arm rests Bed/Chair Transfer: 4: Sit > Supine: Min A (steadying pt. > 75%/lift 1 leg);2: Bed > Chair or W/C: Max A (lift and lower assist);2: Chair or W/C > Bed: Max A (lift and lower assist)  FIM - Locomotion: Wheelchair Locomotion: Wheelchair: 1: Total Assistance/staff pushes wheelchair (Pt<25%) FIM - Locomotion: Ambulation Locomotion: Ambulation Assistive Devices: Fara Boros Ambulation/Gait Assistance: 2: Max assist Locomotion: Ambulation: 1: Travels less than 50 ft with maximal assistance (Pt: 25 -  49%)  Comprehension Comprehension Mode: Auditory Comprehension: 5-Follows basic conversation/direction: With extra time/assistive device  Expression Expression Mode: Verbal Expression: 3-Expresses basic 50 - 74% of the time/requires cueing 25 - 50% of the time. Needs to repeat parts of sentences.  Social Interaction Social Interaction: 5-Interacts appropriately 90% of the time - Needs monitoring or encouragement for participation or interaction.  Problem Solving Problem Solving: 4-Solves basic 75 - 89% of the time/requires cueing 10 - 24% of the time  Memory Memory: 5-Recognizes or recalls 90% of the time/requires cueing < 10% of the time  Medical Problem List and Plan:  1. DVT Prophylaxis/Anticoagulation: Pharmaceutical: Lovenox  2. Pain Management: Will resume norco  prn for headaches.  3. Depression: Occasionally crying/anxious during exam. Will resume Effexor and klonopin as at home. Team to continue to provide Ego support and encouragement. Will have LCSW and psychologist follow for support.  4. Neuropsych: This patient is capable of making decisions on her own behalf.  5. HTN: will monitor with bid checks. Continue labetalol. Titrate medications as needed for better control.  6. Dyslipidemia: Continue Lipitor.  7. CAD: Will continue lipitor and ASA. Monitor for symptoms with activity.  8. Dysphagia: Needs full supervision at meals. Will continue Modified diet per ST recommendations   LOS (Days) 4 A FACE TO FACE EVALUATION WAS PERFORMED  KIRSTEINS,ANDREW E 10/24/2012, 8:37 AM

## 2012-10-25 ENCOUNTER — Inpatient Hospital Stay (HOSPITAL_COMMUNITY): Payer: BC Managed Care – PPO | Admitting: Occupational Therapy

## 2012-10-25 ENCOUNTER — Inpatient Hospital Stay (HOSPITAL_COMMUNITY): Payer: BC Managed Care – PPO | Admitting: Physical Therapy

## 2012-10-25 ENCOUNTER — Inpatient Hospital Stay (HOSPITAL_COMMUNITY): Payer: BC Managed Care – PPO | Admitting: Speech Pathology

## 2012-10-25 NOTE — Progress Notes (Signed)
Speech Language Pathology Daily Session Note  Patient Details  Name: Tiffany [name] MRN: [id number] Date of Birth: Jul 29, 1950  Today's Date: 10/25/2012 Time: 7829-5621 Time Calculation (min): 30 min  Short Term Goals: Week 1: SLP Short Term Goal 1 (Week 1): Pt will consume trials of Dys. 3 textures and demonstrate efficient mastication without overt s/s of aspiration with Min verbal cues. SLP Short Term Goal 2 (Week 1): Pt will utilize swallowing compensatory strategies to minimize overt s/s of aspiration with current diet with Mod I.  SLP Short Term Goal 3 (Week 1): Pt will follow 2 step commands with Min A verbal and question cues with 80% accuracy.  SLP Short Term Goal 4 (Week 1): Pt will verbalize wants/needs at the phrase level with Mod A multimodal cueing.  SLP Short Term Goal 5 (Week 1): Pt will perform oral-motor exercises to increase lingual and labial ROM and strength with supervision verbal cues.  SLP Short Term Goal 6 (Week 1): Pt will attend to right field of enviornment during functional tasks with supervision verbal cues.   Skilled Therapeutic Interventions: Skilled treatment session focused on addressing dysphagia goals.  SLP facilitated session with trials of regular textures and min assist verbal cues to demonstrate efficient mastication and monitor/correct right-sided pocketing and anterior loss.  Patient utilized thin liquid sips to assist with reducing oral residue without overt s/s of aspiration.  Recommend diet upgrade tomorrow with lunch.     FIM:  Comprehension Comprehension Mode: Auditory Comprehension: 5-Follows basic conversation/direction: With no assist Expression Expression Mode: Verbal Expression: 4-Expresses basic 75 - 89% of the time/requires cueing 10 - 24% of the time. Needs helper to occlude trach/needs to repeat words. Social Interaction Social Interaction: 5-Interacts appropriately 90% of the time - Needs monitoring or encouragement for participation  or interaction. Problem Solving Problem Solving: 5-Solves basic problems: With no assist Memory Memory: 5-Recognizes or recalls 90% of the time/requires cueing < 10% of the time FIM - Eating Eating Activity: 5: Supervision/cues;5: Set-up assist for open containers  Pain Pain Assessment Pain Assessment: No/denies pain  Therapy/Group: Individual Therapy  Charlane Ferretti., CCC-SLP [phone]  Tiffany [name] 10/25/2012, 4:59 PM

## 2012-10-25 NOTE — Progress Notes (Signed)
Recreational Therapy Session Note  Patient Details  Name: Tiffany [name] MRN: [id number] Date of Birth: 04-06-1951 Today's Date: 10/25/2012 Pain:no c/o  Per pt request, Pt participated in animal assisted activity/therapy seated w/c level with supervision- min assist.  Pt tearful.  Dicussed pet visitation policy with pt and will provide policy information for family.  Tiffany [name] 10/25/2012, 5:29 PM

## 2012-10-25 NOTE — Progress Notes (Signed)
Occupational Therapy Session Note  Patient Details  Name: Tiffany [name] MRN: [id number] Date of Birth: 08-30-1950  Today's Date: 10/25/2012 Time: 1130-1150 Time Calculation (min): 20 min  Skilled Therapeutic Interventions/Progress Updates:    Pt seen in [employer] with focus on RUE functional use, self feeding & socialization. Pt did not spontaneously use RUE during session & kept it in her lap until assisted to move up to table in functional position. Pt did not appear to have swallowing difficulties, no coughing or clearing of her throat. She ate using her left hand and required assistance for general set up of her tray.   Therapy Documentation Precautions:  Precautions Precautions: Fall Precaution Comments: right hemipareisis, motor apraxia Restrictions Weight Bearing Restrictions: No     Pain: Pain Assessment Pain Assessment: No/denies pain   See FIM for current functional status  Therapy/Group: Group Therapy  Charletta Cousin, Amy Beth Dixon 10/25/2012, 2:27 PM

## 2012-10-25 NOTE — Progress Notes (Signed)
Speech Language Pathology Daily Session Note  Patient Details  Name: Tiffany [name] MRN: [id number] Date of Birth: 1951-03-05  Today's Date: 10/25/2012 Time: 1150-1205 Time Calculation (min): 15 min  Short Term Goals: Week 1: SLP Short Term Goal 1 (Week 1): Pt will consume trials of Dys. 3 textures and demonstrate efficient mastication without overt s/s of aspiration with Min verbal cues. SLP Short Term Goal 2 (Week 1): Pt will utilize swallowing compensatory strategies to minimize overt s/s of aspiration with current diet with Mod I.  SLP Short Term Goal 3 (Week 1): Pt will follow 2 step commands with Min A verbal and question cues with 80% accuracy.  SLP Short Term Goal 4 (Week 1): Pt will verbalize wants/needs at the phrase level with Mod A multimodal cueing.  SLP Short Term Goal 5 (Week 1): Pt will perform oral-motor exercises to increase lingual and labial ROM and strength with supervision verbal cues.  SLP Short Term Goal 6 (Week 1): Pt will attend to right field of enviornment during functional tasks with supervision verbal cues.   Skilled Therapeutic Interventions: Group, co-treatment session with OT to focus on safety with swallowing and self-feeding. SLP facilitated session with Dys.2 textures and thin liquids via cup. Patient required increased wait time and supervision assist to utilize safe swallow strategies verbal cues to self-monitor and correct life-sided pocketing and anterior loss. Patient demonstrated no overt s/s of aspiration throughout meal. Continue with current plan of care.    FIM:  Comprehension Comprehension Mode: Auditory Comprehension: 5-Follows basic conversation/direction: With no assist Expression Expression Mode: Verbal Expression: 4-Expresses basic 75 - 89% of the time/requires cueing 10 - 24% of the time. Needs helper to occlude trach/needs to repeat words. Social Interaction Social Interaction: 5-Interacts appropriately 90% of the time - Needs  monitoring or encouragement for participation or interaction. Problem Solving Problem Solving: 5-Solves basic problems: With no assist Memory Memory: 5-Recognizes or recalls 90% of the time/requires cueing < 10% of the time FIM - Eating Eating Activity: 5: Supervision/cues;5: Set-up assist for open containers  Pain Pain Assessment Pain Assessment: No/denies pain  Therapy/Group: Group Therapy  Charlane Ferretti., CCC-SLP [phone]  Tiffany [name] 10/25/2012, 4:35 PM

## 2012-10-25 NOTE — Progress Notes (Signed)
Patient ID: Tiffany [name], female   DOB: 11-16-1950, 62 y.o.   MRN: [id number] Subjective/Complaints: 62 y.o. RH- female with history of CAD, HTN, TIA event a month ago; who developed difficulty speaking with right sided weakness on am of 10/16/12. CCT with bilateral subcortical hypodensities and patient treated with TPA. Carotid dopplers with <80% right ICA stenosis. 2 D echo with EF 60-65% and grade one dysfunction. Around midnight on 04/29 patient with progression of symptoms with dense right hemiparesis with inability to follow commands. Follow up CCT with developing focus of low attenuation left insular cortex and deep left frontal white matter, no hemorrhage. MRI brain done revealing L-MCA territory infarct affecting insula, operculum, left motor strip, superior parietal lobe, and left splenium. MRA brain with LMCA occlusion in M1 segment with no distal flow signal. 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 rate 18, height 5\' 7"  (1.702 m), weight 82 kg (180 lb 12.4 oz), SpO2 98.00%. No results found. Results for orders placed during the hospital encounter of 10/20/12 (from the past 72 hour(s))  COMPREHENSIVE METABOLIC PANEL     Status: Abnormal   Collection Time    10/23/12  5:18 AM      Result Value Range   Sodium 142  135 - 145 mEq/L   Potassium 3.7  3.5 - 5.1 mEq/L   Chloride 101  96 - 112 mEq/L   CO2 34 (*) 19 - 32 mEq/L   Glucose, Bld 102 (*) 70 - 99 mg/dL   BUN 14  6 - 23 mg/dL   Creatinine, Ser 4.09  0.50 - 1.10 mg/dL   Calcium 9.6  8.4 - 81.1 mg/dL   Total Protein 6.5  6.0 - 8.3 g/dL   Albumin 3.2 (*) 3.5 - 5.2 g/dL   AST 21  0 - 37 U/L   ALT 14  0 - 35 U/L   Alkaline Phosphatase 88  39 - 117 U/L   Total Bilirubin 0.4  0.3 - 1.2 mg/dL    GFR calc non Af Amer 74 (*) >90 mL/min   GFR calc Af Amer 86 (*) >90 mL/min   Comment:            The eGFR has been calculated     using the CKD EPI equation.     This calculation has not been     validated in all clinical     situations.     eGFR's persistently     <90 mL/min signify     possible Chronic Kidney Disease.  CBC WITH DIFFERENTIAL     Status: Abnormal   Collection Time    10/23/12  5:18 AM      Result Value Range   WBC 11.4 (*) 4.0 - 10.5 K/uL   RBC 4.42  3.87 - 5.11 MIL/uL   Hemoglobin 13.9  12.0 - 15.0 g/dL   HCT 91.4  78.2 - 95.6 %   MCV 90.0  78.0 - 100.0 fL   MCH 31.4  26.0 - 34.0 pg   MCHC 34.9  30.0 - 36.0 g/dL   RDW 21.3  08.6 - 57.8 %   Platelets 308  150 - 400 K/uL   Neutrophils Relative 48  43 - 77 %   Neutro Abs 5.4  1.7 - 7.7  K/uL   Lymphocytes Relative 37  12 - 46 %   Lymphs Abs 4.2 (*) 0.7 - 4.0 K/uL   Monocytes Relative 12  3 - 12 %   Monocytes Absolute 1.4 (*) 0.1 - 1.0 K/uL   Eosinophils Relative 4  0 - 5 %   Eosinophils Absolute 0.4  0.0 - 0.7 K/uL   Basophils Relative 0  0 - 1 %   Basophils Absolute 0.0  0.0 - 0.1 K/uL     HEENT: normal Cardio: RRR and no murmur Resp: CTA B/L and unlabored GI: BS positive and non tender Extremity:  Pulses positive and No Edema Skin:   Intact Neuro: Flat, Cranial Nerve Abnormalities R central 7, Abnormal Sensory difficult to assess secondary to speech, Abnormal Motor 2-/5 in RUE, 3-/5 RLE, 5/5 on Left side, Abnormal FMC Ataxic/ dec FMC and Aphasic Musc/Skel:  Normal Gen NAD.  Names 1/2 simple objects, states first name and maiden name   Assessment/Plan: 1. 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 Physiatrist is providing close team supervision and 24 hour management of active medical problems listed below. Physiatrist and rehab team continue to assess barriers to discharge/monitor  patient progress toward functional and medical goals. FIM: FIM - Bathing Bathing Steps Patient Completed: Chest;Right Arm;Abdomen;Front perineal area;Buttocks;Right upper leg;Left upper leg;Right lower leg (including foot);Left lower leg (including foot) Bathing: 4: Min-Patient completes 8-9 7f 10 parts or 75+ percent  FIM - Upper Body Dressing/Undressing Upper body dressing/undressing steps patient completed: Thread/unthread right sleeve of pullover shirt/dresss;Thread/unthread left sleeve of pullover shirt/dress;Put head through opening of pull over shirt/dress;Pull shirt over trunk Upper body dressing/undressing: 5: Supervision: Safety issues/verbal cues FIM - Lower Body Dressing/Undressing Lower body dressing/undressing steps patient completed: Thread/unthread right pants leg;Thread/unthread left pants leg;Pull pants up/down Lower body dressing/undressing: 4: Min-Patient completed 75 plus % of tasks  FIM - Toileting Toileting steps completed by patient: Adjust clothing prior to toileting Toileting Assistive Devices: Grab bar or rail for support Toileting: 3: Mod-Patient completed 2 of 3 steps  FIM - Diplomatic Services operational officer Devices: Therapist, music Transfers: 1-Two helpers  FIM - Banker Devices: Arm rests Bed/Chair Transfer: 4: Supine > Sit: Min A (steadying Pt. > 75%/lift 1 leg);4: Sit > Supine: Min A (steadying pt. > 75%/lift 1 leg);2: Chair or W/C > Bed: Max A (lift and lower assist);2: Bed > Chair or W/C: Max A (lift and lower assist)  FIM - Locomotion: Wheelchair Locomotion: Wheelchair: 1: Total Assistance/staff pushes wheelchair (Pt<25%) FIM - Locomotion: Ambulation Locomotion: Ambulation Assistive Devices: Fara Boros Ambulation/Gait Assistance: 2: Max assist Locomotion: Ambulation: 0: Activity did not occur  Comprehension Comprehension Mode: Auditory Comprehension: 5-Follows basic conversation/direction: With  extra time/assistive device  Expression Expression Mode: Verbal Expression: 3-Expresses basic 50 - 74% of the time/requires cueing 25 - 50% of the time. Needs to repeat parts of sentences.  Social Interaction Social Interaction: 5-Interacts appropriately 90% of the time - Needs monitoring or encouragement for participation or interaction.  Problem Solving Problem Solving: 4-Solves basic 75 - 89% of the time/requires cueing 10 - 24% of the time  Memory Memory: 5-Recognizes or recalls 90% of the time/requires cueing < 10% of the time  Medical Problem List and Plan:  1. DVT Prophylaxis/Anticoagulation: Pharmaceutical: Lovenox  2. Pain Management: Will resume norco prn for headaches.  3. Depression: Occasionally crying/anxious during exam. Will resume Effexor  and klonopin as at home. Team to continue to provide Ego support and encouragement. Will have LCSW and psychologist follow for support.  4. Neuropsych: This patient is capable of making decisions on her own behalf.  5. HTN: will monitor with bid checks. Continue labetalol. Titrate medications as needed for better control.  6. Dyslipidemia: Continue Lipitor.  7. CAD: Will continue lipitor and ASA. Monitor for symptoms with activity.  8. Dysphagia: Needs full supervision at meals. Will continue Modified diet per ST recommendations 9.  Bowel incont  LOS (Days) 5 A FACE TO FACE EVALUATION WAS PERFORMED  Areeba Sulser E 10/25/2012, 9:13 AM

## 2012-10-25 NOTE — Progress Notes (Signed)
Occupational Therapy Session Note  Patient Details  Name: Tiffany [name] MRN: [id number] Date of Birth: 1951-05-28  Today's Date: 10/25/2012 Time: 4098-1191 and 4782-9562 Time Calculation (min): 60 min and 32 min  Short Term Goals: Week 1:  OT Short Term Goal 1 (Week 1): Pt. will bathe UB at supervision level OT Short Term Goal 2 (Week 1): Pt. will bathe LB at minimal assist level with sit to stand OT Short Term Goal 3 (Week 1): Pt. will use RUE as gross assist level during ADL OT Short Term Goal 4 (Week 1): Pt. will transfer to toilet at minimal assist level  Skilled Therapeutic Interventions/Progress Updates:    1) Pt seen for ADL retraining with focus on functional use of RUE with obtaining clothing and obtaining and opening items for grooming tasks.  Pt refused bathing this session, but willing to complete dressing.  Focus on use of BUE with obtaining clothing and removing clothing from hangers, pt reports difficulty with maintaining grasp.  Completed dressing at sit to stand level with pt requiring cues for hemi-technique to don Rt side first and cues for orientation of shirt.  Motor ataxia noted at times throughout dressing and grooming tasks.  Performed grooming in standing with min/steady assist for standing balance when bending to spit with oral hygiene.  Engaged in NM re-ed with focus on use of RUE with attempts to grasp items and move across horizontal plane.  2) Pt seen for 1:1 OT with focus on NM re-ed of RUE with focus on joint isolation.  Engaged in horizontal reaching task on table top to increase attention to RUE and integration in simple functional tasks.  Pt able to elicit wrist flexion, supination, and pronation during session.  Pt with ability to initiate thumb and first finger opposition, but unable to grasp 2" block.  Encouraged pt to engage in wrist flexion/extension as well as opposition exercises during down time.  Therapy Documentation Precautions:   Precautions Precautions: Fall Precaution Comments: right hemipareisis, motor apraxia Restrictions Weight Bearing Restrictions: No General:   Vital Signs: Therapy Vitals Temp: 97.8 F (36.6 C) Temp src: Oral Pulse Rate: 96 Resp: 18 BP: 133/82 mmHg Oxygen Therapy SpO2: 98 % O2 Device: None (Room air) Pain:  Pt with no c/o pain this session.  See FIM for current functional status  Therapy/Group: Individual Therapy  Leonette Monarch 10/25/2012, 9:47 AM

## 2012-10-25 NOTE — Progress Notes (Signed)
Physical Therapy Session Note  Patient Details  Name: Tiffany [name] MRN: [id number] Date of Birth: 09-Jan-1951  Today's Date: 10/25/2012 Time: 0930-1028 Time Calculation (min): 58 min  Short Term Goals: Week 1:  PT Short Term Goal 1 (Week 1): Patient will be able to perform bed mobility with min-A PT Short Term Goal 2 (Week 1): Patient will be able to perform transfers with min-A PT Short Term Goal 3 (Week 1): Patient will be able to ambulate 20 feet using LRAD with min-A PT Short Term Goal 4 (Week 1): Patient will be able to ascend/descend 4 steps using hand rail with Mod-A  Therapy Documentation Precautions:  Precautions Precautions: Fall Precaution Comments: right hemipareisis, motor apraxia Restrictions Weight Bearing Restrictions: No Pain: Pain Assessment Pain Assessment: No/denies pain Locomotion : Ambulation Ambulation/Gait Assistance: 3: Mod assist with use of PFRW for RUE support and use of ace wrap around R foot for DF assist and toe clearance and to facilitate upright posture x 60' x 2 reps with mod A overall with verbal cues and manual facilitation to maintain upright posture, lateral weight shifting and to increase RLE step length, heel strike and RLE extension activation during stance; as patient fatigued she presented with decreased R trunk elongation, decreased R step length and increased RLE flexion in stance.  Discussed with patient having family bring in lace up shoes from home for trial with ALLARD or blue rocker.  Patient isn't sure if she has a lace up shoe at home; will discuss with family possibly buying a pair of sneakers for gait training.   Other Treatments: Treatments Neuromuscular Facilitation: Right;Lower Extremity;Forced use;Activity to increase timing and sequencing;Activity to increase motor control;Activity to increase sustained activation;Activity to increase lateral weight shifting;Activity to increase anterior-posterior weight shifting during sit >  stand training from w/c with use of LUE reaching up, forwards and to the L to place horse shoes on tall target to facilitate RLE extension activation during sit > stand and during stance to facilitate L lateral weight shifting x 10 reps progressing from mod A to min A for sit > stand and balance once standing.    See FIM for current functional status  Therapy/Group: Individual Therapy  Edman Circle [facility] 10/25/2012, 10:41 AM

## 2012-10-25 NOTE — Progress Notes (Signed)
Inpatient RehabilitationTeam Conference and Plan of Care Update Date: 10/25/2012   Time: 4:27 PM    Patient Name: Tiffany [name]      Medical Record Number: [id number]  Date of Birth: 1951-06-17 Sex: Female         Room/Bed: 4151/4151-01 Payor Info: Payor: BLUE CROSS BLUE [employer]IELD  Plan: [employer] PPO  Product Type: *No Product type*     Admitting Diagnosis: CVA  Admit Date/Time:  10/20/2012  6:34 PM Admission Comments: No comment available   Primary Diagnosis:  CVA (cerebral infarction) Principal Problem: CVA (cerebral infarction)  Patient Active Problem List   Diagnosis Date Noted  . CVA (cerebral infarction) 10/23/2012  . Depression 10/23/2012  . Dyslipidemia 10/23/2012    Expected Discharge Date:  11/10/12  Team Members Present:  Dr. Claudette Laws, Melanee Spry, [address] Tora Duck, [address] [place], [place], Sarah Hoxie, Clarisse Gouge Ripa, Blanche [place], [place] Edwin Cap [place].          Current Status/Progress Goal Weekly Team Focus  Medical   apraxia, reduced sensation, hemiparesis R improving, aphasic incont bowel today  improve bowel cont  see above   Bowel/Bladder             Swallow/Nutrition/ Hydration   Dys.2 textures and thin liquids with full staff supervision   least restrictive p.o. intake   trials of Dys.3 textures    ADL's   supervision for UB dressing, mod assist LB dressing and shower transfer, min assist bathing, mod assist functional transfers  supervision overall  neuro re-ed to RUE, functional transfers, standing balance, family education   Mobility   Mod A overall  Supervision-min A overall  NMR for R activation during transfers, gait, balance   Communication   mod assist  supervision-min assist   increase carryover of oral motor ecercises   Safety/Cognition/ Behavioral Observations  min assist   supervision   increase awareness, self-monitoring and correcting   Pain             Skin                 *See Care Plan and progress notes for long and short-term goals.  Barriers to Discharge: mobile home husband elderly    Possible Resolutions to Barriers:  longer LOS to improve independance    Discharge Planning/Teaching Needs:  Home with husband to provide any assistance needed      Team Discussion:  Lg incontinent BM-pt said she knew she had to go-not enough time.  R side & speech improving-ambulation increasing, however, still w/ R side inattention & apraxia.  Will need to be able to do stairs at d/c.  Goals are S-Min A; short distance ambulation.  Revisions to Treatment Plan:  none   Continued Need for Acute Rehabilitation Level of Care: The patient requires daily medical management by a physician with specialized training in physical medicine and rehabilitation for the following conditions: Daily direction of a multidisciplinary physical rehabilitation program to ensure safe treatment while eliciting the highest outcome that is of practical value to the patient.: Yes Daily medical management of patient stability for increased activity during participation in an intensive rehabilitation regime.: Yes Daily analysis of laboratory values and/or radiology reports with any subsequent need for medication adjustment of medical intervention for : Neurological problems  Brock Ra 10/25/2012, 4:27 PM

## 2012-10-26 ENCOUNTER — Inpatient Hospital Stay (HOSPITAL_COMMUNITY): Payer: BC Managed Care – PPO | Admitting: *Deleted

## 2012-10-26 ENCOUNTER — Inpatient Hospital Stay (HOSPITAL_COMMUNITY): Payer: BC Managed Care – PPO | Admitting: Occupational Therapy

## 2012-10-26 ENCOUNTER — Inpatient Hospital Stay (HOSPITAL_COMMUNITY): Payer: BC Managed Care – PPO | Admitting: Speech Pathology

## 2012-10-26 NOTE — Progress Notes (Signed)
Patient ID: Tiffany [name], female   DOB: [birth year], 62 y.o.   MRN: [id number] Subjective/Complaints: 62 y.o. RH- female with history of CAD, HTN, TIA event a month ago; who developed difficulty speaking with right sided weakness on am of 10/16/12. CCT with bilateral subcortical hypodensities and patient treated with TPA. Carotid dopplers with <80% right ICA stenosis. 2 D echo with EF 60-65% and grade one dysfunction. Around midnight on 04/29 patient with progression of symptoms with dense right hemiparesis with inability to follow commands. Follow up CCT with developing focus of low attenuation left insular cortex and deep left frontal white matter, no hemorrhage. MRI brain done revealing L-MCA territory infarct affecting insula, operculum, left motor strip, superior parietal lobe, and left splenium. MRA brain with LMCA occlusion in M1 segment with no distal flow signal. Neurology felt that the patient had an embolic CVA Incont bowels Longer sentences with improved grammar No c/os Review of Systems  Unable to perform ROS: medical condition   Objective: Vital Signs: Blood pressure 111/74, pulse 95, temperature 97.5 F (36.4 C), temperature source Oral, resp. rate 17, height 5\' 7"  (1.702 m), weight 82 kg (180 lb 12.4 oz), SpO2 93.00%. No results found. No results found for this or any previous visit (from the past 72 hour(s)).   HEENT: normal Cardio: RRR and no murmur Resp: CTA B/L and unlabored GI: BS positive and non tender Extremity:  Pulses positive and No Edema Skin:   Intact Neuro: Flat, Cranial Nerve Abnormalities R central 7, Abnormal Sensory difficult to assess secondary to speech, Abnormal Motor 2-/5 in RUE, 3-/5 RLE, 5/5 on Left side, Abnormal FMC Ataxic/ dec FMC and Aphasic Musc/Skel:  Normal Gen NAD.  Names 4/4 simple objects,also names stethoscope Assessment/Plan: 1. 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 Physiatrist is providing close team supervision and 24 hour management of active medical problems listed below. Physiatrist and rehab team continue to assess barriers to discharge/monitor patient progress toward functional and medical goals. FIM: FIM - Bathing Bathing Steps Patient Completed: Chest;Right Arm;Abdomen;Front perineal area;Buttocks;Right upper leg;Left upper leg;Right lower leg (including foot);Left lower leg (including foot) Bathing: 4: Min-Patient completes 8-9 36f 10 parts or 75+ percent  FIM - Upper Body Dressing/Undressing Upper body dressing/undressing steps patient completed: Thread/unthread right sleeve of pullover shirt/dresss;Thread/unthread left sleeve of pullover shirt/dress;Put head through opening of pull over shirt/dress;Pull shirt over trunk Upper body dressing/undressing: 5: Supervision: Safety issues/verbal cues FIM - Lower Body Dressing/Undressing Lower body dressing/undressing steps patient completed: Thread/unthread right pants leg;Thread/unthread left pants leg;Pull pants up/down Lower body dressing/undressing: 4: Min-Patient completed 75 plus % of tasks  FIM - Toileting Toileting steps completed by patient: Adjust clothing prior to toileting Toileting Assistive Devices: Grab bar or rail for support Toileting: 1: Total-Patient completed zero steps, helper did all 3  FIM - Diplomatic Services operational officer Devices: Bedside commode Toilet Transfers: 3-To toilet/BSC: Mod A (lift or lower assist);2-From toilet/BSC: Max A (lift and lower assist)  FIM - Banker Devices: Arm rests Bed/Chair Transfer: 3: Chair or W/C > Bed: Mod A (lift or lower assist);3: Bed > Chair or W/C: Mod A (lift or lower assist)  FIM - Locomotion: Wheelchair Locomotion: Wheelchair: 1: Total Assistance/staff pushes wheelchair (Pt<25%) FIM - Locomotion: Ambulation Locomotion: Ambulation  Assistive Devices: TEFL teacher Ambulation/Gait Assistance: 3: Mod assist Locomotion: Ambulation: 2: Travels 50 - 149 ft with moderate assistance (Pt:  50 - 74%)  Comprehension Comprehension Mode: Auditory Comprehension: 5-Follows basic conversation/direction: With no assist  Expression Expression Mode: Verbal Expression: 4-Expresses basic 75 - 89% of the time/requires cueing 10 - 24% of the time. Needs helper to occlude trach/needs to repeat words.  Social Interaction Social Interaction: 5-Interacts appropriately 90% of the time - Needs monitoring or encouragement for participation or interaction.  Problem Solving Problem Solving: 4-Solves basic 75 - 89% of the time/requires cueing 10 - 24% of the time  Memory Memory: 5-Recognizes or recalls 90% of the time/requires cueing < 10% of the time  Medical Problem List and Plan:  1. DVT Prophylaxis/Anticoagulation: Pharmaceutical: Lovenox  2. Pain Management: Will resume norco prn for headaches.  3. Depression: Occasionally crying/anxious during exam. Will resume Effexor and klonopin as at home. Team to continue to provide Ego support and encouragement. Will have LCSW and psychologist follow for support.  4. Neuropsych: This patient is capable of making decisions on her own behalf.  5. HTN: will monitor with bid checks. Continue labetalol. Titrate medications as needed for better control.  6. Dyslipidemia: Continue Lipitor.  7. CAD: Will continue lipitor and ASA. Monitor for symptoms with activity.  8. Dysphagia: Needs full supervision at meals. Will continue Modified diet per ST recommendations 9.  Bowel incont  LOS (Days) 6 A FACE TO FACE EVALUATION WAS PERFORMED  Cherity Blickenstaff E 10/26/2012, 8:20 AM

## 2012-10-26 NOTE — Progress Notes (Signed)
Physical Therapy Session Note  Patient Details  Name: Tiffany [name] MRN: [id number] Date of Birth: Apr 08, 1951  Today's Date: 10/26/2012 Time: 4132-4401 Time Calculation (min): 30 min  Short Term Goals: Week 1:  PT Short Term Goal 1 (Week 1): Patient will be able to perform bed mobility with min-A PT Short Term Goal 2 (Week 1): Patient will be able to perform transfers with min-A PT Short Term Goal 3 (Week 1): Patient will be able to ambulate 20 feet using LRAD with min-A PT Short Term Goal 4 (Week 1): Patient will be able to ascend/descend 4 steps using hand rail with Mod-A  Skilled Therapeutic Interventions/Progress Updates:  Tx focused on gait training with/without AFO for RLE support. Staff pushed WC to gym.  Gait training 1x40' with PFRW and no brace with Mod A for RW management, R step placement occasionally, and blocking R knee for safety.  Pt still requires up to Mod A for gait with PFRW and AFO, but primarily for weight-shifting and RW management. Pt able to walk 2x80', limited by fatigue. Pt needing demonstration for gait pattern and step length and needs cues for completing accurate R step before advancing. Overall, the addition of brace greatly increases foot clearance and balance.  Pt needing Min A for sit<>stand with cues for hand placement and technique, but Mod A for WC<>mat transfers due to turning.      Therapy Documentation Precautions:  Precautions Precautions: Fall Precaution Comments: right hemipareisis, motor apraxia Restrictions Weight Bearing Restrictions: No   Pain: Pain Assessment Pain Assessment: No/denies pain    Locomotion : Ambulation Ambulation/Gait Assistance: 3: Mod assist   See FIM for current functional status  Therapy/Group: Individual Therapy  Clydene Laming, PT, DPT  10/26/2012, 12:19 PM

## 2012-10-26 NOTE — Progress Notes (Signed)
Occupational Therapy Session Note  Patient Details  Name: Tiffany [name] MRN: [id number] Date of Birth: 01/27/1951  Today's Date: 10/26/2012 Time: 1130-1145 Time Calculation (min): 15 min  Skilled Therapeutic Interventions/Progress Updates:    Pt seen for group therapy session in [employer] today with focus on self feeding, functional use of R hand, use of R as stabilizer, self feeding and swallowing strategies. Pt ate sandwich today w/o noted difficulty swallowing. She required frequent cues & demonstration/assist to use R hand to stabilize her yogurt & peach cups as well as to place right UE on table for functional use as well as to wipe the right side of her mouth due to decreased sensation.  Therapy Documentation Precautions:  Precautions Precautions: Fall Precaution Comments: right hemipareisis, motor apraxia Restrictions Weight Bearing Restrictions: No     Pain: Pain Assessment Pain Assessment: No/denies pain     See FIM for current functional status  Therapy/Group: Group Therapy  Charletta Cousin, Hawley Michel Beth Dixon 10/26/2012, 1:42 PM

## 2012-10-26 NOTE — Progress Notes (Signed)
Social Work Patient ID: Tiffany [name], female   DOB: 08/31/1950, 62 y.o.   MRN: [id number]  Met with pt, spouse and son following team conference.  Reviewed target d/c date of 5/23 with supervision to minimal assist goals.  Son concerned that he has not been contacted yet at that point about his mother.  Attempted to answer all of his questions and asked PA to follow up with him as well.  All seem agreeable with target date and goals.  Will continue to follow.  Adamari Frede, LCSW

## 2012-10-26 NOTE — Evaluation (Signed)
Recreational Therapy Assessment and Plan  Patient Details  Name: Tiffany [name] MRN: [id number] Date of Birth: 09-May-1951 Today's Date: 10/26/2012  Rehab Potential: Good ELOS: 3 weeks   Assessment Clinical Impression: Past Medical History:  Past Medical History   Diagnosis  Date   .  Coronary artery disease    .  Hypertension    .  TIA (transient ischemic attack)    .  High cholesterol    .  MI (myocardial infarction)    .  Stroke    .  Arthritis     Past Surgical History:  Past Surgical History   Procedure  Laterality  Date   .  Cardiac surgery     .  Tee without cardioversion  N/A  10/18/2012     Procedure: TRANSESOPHAGEAL ECHOCARDIOGRAM (TEE); Surgeon: Vesta Mixer, MD; Location: [facility] ENDOSCOPY; Service: Cardiovascular; Laterality: N/A;   .  Coronary artery bypass graft      Assessment & Plan  Clinical Impression:Tiffany [name] is a 62 y.o. RH- female with history of CAD, HTN, TIA event a month ago; who developed difficulty speaking with right sided weakness on am of 10/16/12. CCT with bilateral subcortical hypodensities and patient treated with TPA. Carotid dopplers with <80% right ICA stenosis. 2 D echo with EF 60-65% and grade one dysfunction. Around midnight on 04/29 patient with progression of symptoms with dense right hemiparesis with inability to follow commands. Follow up CCT with developing focus of low attenuation left insular cortex and deep left frontal white matter, no hemorrhage. MRI brain done revealing L-MCA territory infarct affecting insula, operculum, left motor strip, superior parietal lobe, and left splenium. MRA brain with LMCA occlusion in M1 segment with no distal flow signal. 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  Patient transferred to CIR on 10/20/2012.  Pt presents with decreased activity tolerance, decreased functional mobility, decreased balance, right sided weakness, right inattention, expressive  aphasia limiting pt's independence with leisure/community pursuits.  Leisure History/Participation Premorbid leisure interest/current participation: Games - Word-search;Games - Cards;Crafts - Painting;Crafts - Other (Comment);Community - Press photographer - Grocery store;Community - Travel (Comment) Expression Interests: Music (Comment);Play instrument (Comment);Singing (mandolin, plays with husband/group) Other Leisure Interests: Television;Reading;Computer;Cooking/Baking (baking when she has time) Leisure Participation Style: With Family/Friends Awareness of Community Resources: Excellent Psychosocial / Spiritual Spiritual Interests: Church Patient agreeable to Pet Therapy: Yes Does patient have pets?: Yes (dog, cats) Social interaction - Mood/Behavior: Cooperative Firefighter Appropriate for Education?: Yes Patient Agreeable to Outing?: Yes Recreational Therapy Orientation Orientation -Reviewed with patient: Available activity resources Strengths/Weaknesses Patient Strengths/Abilities: Willingness to participate;Active premorbidly Patient weaknesses: Physical limitations  Plan Rec Therapy Plan Is patient appropriate for Therapeutic Recreation?: Yes Rehab Potential: Good Treatment times per week: Min 2 times per week >20 minutes Estimated Length of Stay: 3 weeks TR Treatment/Interventions: Adaptive equipment instruction;1:1 session;Balance/vestibular training;Functional mobility training;Community reintegration;Cognitive remediation/compensation;Patient/family education;Therapeutic activities;Recreation/leisure participation;Therapeutic exercise;UE/LE Coordination activities;Visual/perceptual remediation/compensation;Wheelchair propulsion/positioning Recommendations for other services: Neuropsych  Recommendations for other services: Neuropsych  Discharge Criteria: Patient will be discharged from TR if patient refuses treatment 3 consecutive times without medical  reason.  If treatment goals not met, if there is a change in medical status, if patient makes no progress towards goals or if patient is discharged from hospital.  The above assessment, treatment plan, treatment alternatives and goals were discussed and mutually agreed upon: by patient  Ludell Zacarias 10/26/2012, 4:31 PM

## 2012-10-26 NOTE — Progress Notes (Signed)
Speech Language Pathology Daily Session Note  Patient Details  Name: Tiffany [name] MRN: [id number] Date of Birth: 13-May-1951  Today's Date: 10/26/2012 Time: 0950-1030 Time Calculation (min): 40 min  Short Term Goals: Week 1: SLP Short Term Goal 1 (Week 1): Pt will consume trials of Dys. 3 textures and demonstrate efficient mastication without overt s/s of aspiration with Min verbal cues. SLP Short Term Goal 2 (Week 1): Pt will utilize swallowing compensatory strategies to minimize overt s/s of aspiration with current diet with Mod I.  SLP Short Term Goal 3 (Week 1): Pt will follow 2 step commands with Min A verbal and question cues with 80% accuracy.  SLP Short Term Goal 4 (Week 1): Pt will verbalize wants/needs at the phrase level with Mod A multimodal cueing.  SLP Short Term Goal 5 (Week 1): Pt will perform oral-motor exercises to increase lingual and labial ROM and strength with supervision verbal cues.  SLP Short Term Goal 6 (Week 1): Pt will attend to right field of enviornment during functional tasks with supervision verbal cues.   Skilled Therapeutic Interventions: Skilled treatment session focused on addressing language goals.  SLP facilitated session with task of ordering lunch with menu and min assist verbal cues to carryover utilizing visualization and description.   RN administered medications whole in puree with SLP provided supervision level cues to refrain from mastication.       FIM:  Comprehension Comprehension Mode: Auditory Comprehension: 5-Follows basic conversation/direction: With no assist Expression Expression Mode: Verbal Expression: 4-Expresses basic 75 - 89% of the time/requires cueing 10 - 24% of the time. Needs helper to occlude trach/needs to repeat words. Social Interaction Social Interaction: 5-Interacts appropriately 90% of the time - Needs monitoring or encouragement for participation or interaction. Problem Solving Problem Solving: 5-Solves basic  problems: With no assist Memory Memory: 5-Recognizes or recalls 90% of the time/requires cueing < 10% of the time FIM - Eating Eating Activity: 5: Supervision/cues;5: Set-up assist for open containers  Pain Pain Assessment Pain Assessment: No/denies pain  Therapy/Group: Individual Therapy  Charlane Ferretti., CCC-SLP [phone]  Tiffany [name] 10/26/2012, 11:14 AM

## 2012-10-26 NOTE — Progress Notes (Signed)
Speech Language Pathology Daily Session Note  Patient Details  Name: Tiffany [name] MRN: [id number] Date of Birth: [birth year]  Today's Date: 10/26/2012 Time: 1478-2956 Time Calculation (min): 25 min  Short Term Goals: Week 1: SLP Short Term Goal 1 (Week 1): Pt will consume trials of Dys. 3 textures and demonstrate efficient mastication without overt s/s of aspiration with Min verbal cues. SLP Short Term Goal 2 (Week 1): Pt will utilize swallowing compensatory strategies to minimize overt s/s of aspiration with current diet with Mod I.  SLP Short Term Goal 3 (Week 1): Pt will follow 2 step commands with Min A verbal and question cues with 80% accuracy.  SLP Short Term Goal 4 (Week 1): Pt will verbalize wants/needs at the phrase level with Mod A multimodal cueing.  SLP Short Term Goal 5 (Week 1): Pt will perform oral-motor exercises to increase lingual and labial ROM and strength with supervision verbal cues.  SLP Short Term Goal 6 (Week 1): Pt will attend to right field of enviornment during functional tasks with supervision verbal cues.   Skilled Therapeutic Interventions: Group, co-treatment session with OT to focus on safety with swallowing and self-feeding. SLP facilitated session with diet upgrade of Dys.3 textures and thin liquids via cup. Patient required supervision level verbal cues to carryover previously taught safe swallow compensatory strategies.  Patient demonstrated no overt s/s of aspiration throughout meal. Continue with diet upgrade.    FIM:  FIM - Eating Eating Activity: 5: Supervision/cues;5: Set-up assist for open containers  Pain Pain Assessment Pain Assessment: No/denies pain  Therapy/Group: Group Therapy  Charlane Ferretti., CCC-SLP [phone]  Tiffany [name] 10/26/2012, 4:31 PM

## 2012-10-26 NOTE — Progress Notes (Signed)
Occupational Therapy Session Note  Patient Details  Name: Tiffany [name] MRN: [id number] Date of Birth: [birth year]  Today's Date: 10/26/2012 Time: 4098-1191 and 4782-9562 Time Calculation (min): 60 min and 30 min  Short Term Goals: Week 1:  OT Short Term Goal 1 (Week 1): Pt. will bathe UB at supervision level OT Short Term Goal 2 (Week 1): Pt. will bathe LB at minimal assist level with sit to stand OT Short Term Goal 3 (Week 1): Pt. will use RUE as gross assist level during ADL OT Short Term Goal 4 (Week 1): Pt. will transfer to toilet at minimal assist level  Skilled Therapeutic Interventions/Progress Updates:    1) Pt seen for ADL retraining with focus on increased use of RUE in self-care tasks, sit to stand, and carryover of hemi-dressing technique.  Pt required cues for sequencing and orientation of sports bra and pull over shirt with verbalization of errors and "I should know this".  Encouraged pt to attempt hand over hand technique with washing face to integrate RUE into task.  Pt with increased difficulty with sit to stand this session requiring mod assist and tactile cues at Rt knee and hips for weight bearing and weight shifting forward with transitional movement.  Conducted grooming in sitting with cues to increase integration of RUE into task, by stabilizing items.  NM re-ed in sitting with focus on shoulder activation on horizontal plane against gravity, pt required stabilization at elbow to decrease gravity.  2) Pt seen for 1:1 OT with focus on NM re-ed in sitting and standing.  Focus on increased use of RUE with attempts at grasping items and moving them along horizontal surface.  Pt with increased shoulder flexion this session against gravity to approx 70 degrees.  Increased challenge by having pt complete table top activity in standing, pt required mod assist sit to stand this session and blocking at Rt knee with prolonged standing and UE and trunk movement.  Pt's family present upon  return to room.  Encouraged pt to continue any active movement with RUE during down time and suggested tasks to assist in setup of dinner tray tonight.  Therapy Documentation Precautions:  Precautions Precautions: Fall Precaution Comments: right hemipareisis, motor apraxia Restrictions Weight Bearing Restrictions: No Pain:  pt with no c/o pain this session.  See FIM for current functional status  Therapy/Group: Individual Therapy  Leonette Monarch 10/26/2012, 10:09 AM

## 2012-10-27 ENCOUNTER — Inpatient Hospital Stay (HOSPITAL_COMMUNITY): Payer: BC Managed Care – PPO | Admitting: Occupational Therapy

## 2012-10-27 ENCOUNTER — Inpatient Hospital Stay (HOSPITAL_COMMUNITY): Payer: BC Managed Care – PPO

## 2012-10-27 ENCOUNTER — Inpatient Hospital Stay (HOSPITAL_COMMUNITY): Payer: BC Managed Care – PPO | Admitting: Speech Pathology

## 2012-10-27 ENCOUNTER — Inpatient Hospital Stay (HOSPITAL_COMMUNITY): Payer: BC Managed Care – PPO | Admitting: Physical Therapy

## 2012-10-27 LAB — CREATININE, SERUM: Creatinine, Ser: 0.78 mg/dL (ref 0.50–1.10)

## 2012-10-27 NOTE — Progress Notes (Signed)
Speech Language Pathology Daily Session Note  Patient Details  Name: Tiffany [name] MRN: [id number] Date of Birth: [birth year]  Today's Date: 10/27/2012 Time: 1405-1450 Time Calculation (min): 45 min  Short Term Goals: Week 1: SLP Short Term Goal 1 (Week 1): Pt will consume trials of Dys. 3 textures and demonstrate efficient mastication without overt s/s of aspiration with Min verbal cues. SLP Short Term Goal 2 (Week 1): Pt will utilize swallowing compensatory strategies to minimize overt s/s of aspiration with current diet with Mod I.  SLP Short Term Goal 3 (Week 1): Pt will follow 2 step commands with Min A verbal and question cues with 80% accuracy.  SLP Short Term Goal 4 (Week 1): Pt will verbalize wants/needs at the phrase level with Mod A multimodal cueing.  SLP Short Term Goal 5 (Week 1): Pt will perform oral-motor exercises to increase lingual and labial ROM and strength with supervision verbal cues.  SLP Short Term Goal 6 (Week 1): Pt will attend to right field of enviornment during functional tasks with supervision verbal cues.   Skilled Therapeutic Interventions: Skilled treatment session focused on addressing dysphagia and language goals.  SLP facilitated session with max physical assist to sit up to edge of bed, min assist verbal cues to carryover safe swallow compensatory strategies with regular textures and min assist verbal cues to form grammatically correct sentences given 3-4 words.  Family present for session and SLP educated on dysphagia textures and aphasia.    FIM:  Expression Expression: 5-Expresses basic 90% of the time/requires cueing < 10% of the time. Memory Memory: 5-Requires cues to use assistive device FIM - Eating Eating Activity: 5: Set-up assist for open containers  Pain Pain Assessment Pain Assessment: No/denies pain  Therapy/Group: Individual Therapy  Charlane Ferretti., CCC-SLP [phone]  Tiffany [name] 10/27/2012, 5:04 PM

## 2012-10-27 NOTE — Progress Notes (Signed)
Physical Therapy Weekly Progress Note  Patient Details  Name: Tiffany [name] MRN: [id number] Date of Birth: [birth year]  Today's Date: 10/27/2012 Time: 4098-1191 Time Calculation (min): 45 min  Patient has made good progress and has met 2 of 4 short term goals.  Patient is currently min-mod A overall for bed mobility, bed <> w/c transfers, gait short distances with RW and trial Foot up brace, and mod-max A overall for stair negotiation.  Patient showing improved motor return in R UE and LE.  Secondary to continued motor return/improvement PT to wait to order AFO for home and community ambulation until closer to D/C in order to assess true need for brace.  Family has questioned whether or not patient will be able to enter/exit house with stairs or if patient will need ramp.  Will discuss stair goal with family during session this afternoon and demonstrate to family stair negotiation with patient to assist them with decision to build ramp or not.        Patient continues to demonstrate the following deficits: impaired activity tolerance, R hemiplegia with impaired motor control, timing, sequencing, apraxia, impaired attention to R, impaired postural control, dynamic balance, gait and therefore will continue to benefit from skilled PT intervention to enhance overall performance with activity tolerance, balance, postural control, ability to compensate for deficits, functional use of  right upper extremity and right lower extremity, attention, awareness and coordination.  Patient progressing toward long term goals..  Plan of care revisions: ambulation goal distance upgraded, stair goal changed to one rail.  PT Short Term Goals Week 1:  PT Short Term Goal 1 (Week 1): Patient will be able to perform bed mobility with min-A PT Short Term Goal 1 - Progress (Week 1): Met PT Short Term Goal 2 (Week 1): Patient will be able to perform transfers with min-A PT Short Term Goal 2 - Progress (Week 1): Progressing  toward goal PT Short Term Goal 3 (Week 1): Patient will be able to ambulate 20 feet using LRAD with min-A PT Short Term Goal 3 - Progress (Week 1): Progressing toward goal PT Short Term Goal 4 (Week 1): Patient will be able to ascend/descend 4 steps using hand rail with Mod-A PT Short Term Goal 4 - Progress (Week 1): Met Week 2:  PT Short Term Goal 1 (Week 2): Patient will perform bed mobility from flat bed, no rails to L and R consistently with min A PT Short Term Goal 2 (Week 2): Patient will perform bed <> w/c transfers to L and R min A PT Short Term Goal 3 (Week 2): Patient will perform w/c mobility x 150' in controlled environment with hemi technique or bilat foot propulsion with min A PT Short Term Goal 4 (Week 2): Patient will perform gait in controlled environment x 100' with orthosis and LRAD and min A overall PT Short Term Goal 5 (Week 2): Patient will ascend and descend 8 steps with one rail and mod A overall   Skilled Therapeutic Interventions/Progress Updates:   Patient's family in room; brought in another pair of lace up sneakers (not hi-top).  Changed blue rocker to lace up shoes while discussing with family patient's progress and ramp vs. Patient using stairs for home entry/exit upon D/C.  Discussed that ramp would have to be very long secondary to multiple steps.  Discussed with family that patient has performed stairs and has a stair goal.  Patient is expected to reach a min A level for stairs; daughter reports that patient  required min A PTA to perform stairs.  Once AFO donned performed gait assessment with R blue rocker x 75' in controlled environment with PRFW and mod A overall.  Patient required significant assistance to maintain straight navigation of RW, cues for upright posture, L lateral weight shift and full RLE clearance and heel strike and full step length LLE.  In static standing patient unable to place foot flat on ground and extend knee and hip secondary to shape of blue  rocker and forced DF and knee flexion.  Changed to flatter black Allard brace in R shoe and performed gait x 75' more with PFRW and min-mod A overall with improved bilat step and stride length, improved RLE extension in stance with improved L lateral weight shifting, R foot clearance and improved velocity overall.  Still requires increased cues and assistance to sequence and safely advance and place R foot during pivoting or stepping backwards.  Patient reporting significant fatigue and requesting to hold on stairs and return to room and bed prior to supper.  Performed stand pivot mat > w/c > bed with mod A overall and sit > supine on flat bed supervision-min A.    Therapy Documentation Precautions:  Precautions Precautions: Fall Precaution Comments: right hemipareisis, motor apraxia Restrictions Weight Bearing Restrictions: No Vital Signs: Therapy Vitals Temp: 98.3 F (36.8 C) Temp src: Oral Pulse Rate: 72 Resp: 18 BP: 112/75 mmHg Patient Position, if appropriate: Lying Oxygen Therapy SpO2: 99 % O2 Device: None (Room air) Pain: Pain Assessment Pain Assessment: No/denies pain Locomotion : Ambulation Ambulation/Gait Assistance: 3: Mod assist   See FIM for current functional status  Therapy/Group: Individual Therapy  Edman Circle [facility] 10/27/2012, 5:00 PM

## 2012-10-27 NOTE — Progress Notes (Signed)
Speech Language Pathology Daily Session Note  Patient Details  Name: Tiffany [name] MRN: [id number] Date of Birth: [birth year]  Today's Date: 10/27/2012 Time: 5284-1324 Time Calculation (min): 25 min  Short Term Goals: Week 1: SLP Short Term Goal 1 (Week 1): Pt will consume trials of Dys. 3 textures and demonstrate efficient mastication without overt s/s of aspiration with Min verbal cues. SLP Short Term Goal 2 (Week 1): Pt will utilize swallowing compensatory strategies to minimize overt s/s of aspiration with current diet with Mod I.  SLP Short Term Goal 3 (Week 1): Pt will follow 2 step commands with Min A verbal and question cues with 80% accuracy.  SLP Short Term Goal 4 (Week 1): Pt will verbalize wants/needs at the phrase level with Mod A multimodal cueing.  SLP Short Term Goal 5 (Week 1): Pt will perform oral-motor exercises to increase lingual and labial ROM and strength with supervision verbal cues.  SLP Short Term Goal 6 (Week 1): Pt will attend to right field of enviornment during functional tasks with supervision verbal cues.   Skilled Therapeutic Interventions: Group, co-treatment session with OT to focus on safety with swallowing and self-feeding. SLP facilitated session with Dys.3 textures and thin liquids via cup. Patient required set-up assist and increased wait time to self-manage safe swallow compensatory strategies.  Patient demonstrated no overt s/s of aspiration throughout meal. Continue with current plan of care.    FIM:  Expression Expression: 5-Expresses basic 90% of the time/requires cueing < 10% of the time. Memory Memory: 5-Requires cues to use assistive device FIM - Eating Eating Activity: 5: Set-up assist for open containers  Pain Pain Assessment Pain Assessment: No/denies pain  Therapy/Group: Group Therapy  Charlane Ferretti., CCC-SLP [phone]  Tiffany [name] 10/27/2012, 4:59 PM

## 2012-10-27 NOTE — Progress Notes (Signed)
Occupational Therapy Weekly Progress Note  Patient Details  Name: Tiffany [name] MRN: [id number] Date of Birth: 09/16/1950  Today's Date: 10/27/2012 Time: 4098-1191 Time Calculation (min): 56 min  Patient has met 4 of 4 short term goals.  Pt is making steady progress towards goals.  She is currently min-mod assist with transfers from w/c.  Pt is demonstrating improved motor return in RUE and RLE which is facilitating her progress with self-care tasks.  Pt continues to require cues to integrate RUE into functional tasks, though has begun demonstrating increased spontaneous use with some tasks.  As RUE motor coordination and grasp continue to improve pt will show improvements in LB dressing.  Pt continues to demonstrate perceptual deficits and decreased orientation with UB dressing, requiring max cues for orientation and sequencing.  Patient continues to demonstrate the following deficits: impaired activity tolerance, Rt hemiplegia with impaired motor control, timing, sequencing, apraxia, impaired attention to R, impaired postural control, dynamic balance and therefore will continue to benefit from skilled OT intervention to enhance overall performance with BADL and Reduce care partner burden.  Patient progressing toward long term goals..  Continue plan of care.  OT Short Term Goals Week 1:  OT Short Term Goal 1 (Week 1): Pt. will bathe UB at supervision level OT Short Term Goal 1 - Progress (Week 1): Met OT Short Term Goal 2 (Week 1): Pt. will bathe LB at minimal assist level with sit to stand OT Short Term Goal 2 - Progress (Week 1): Met OT Short Term Goal 3 (Week 1): Pt. will use RUE as gross assist level during ADL OT Short Term Goal 3 - Progress (Week 1): Met OT Short Term Goal 4 (Week 1): Pt. will transfer to toilet at minimal assist level OT Short Term Goal 4 - Progress (Week 1): Met Week 2:  OT Short Term Goal 1 (Week 2): Pt will complete bathing at supervision level OT Short Term Goal  2 (Week 2): Pt will complete UB dressing with supervision/cues OT Short Term Goal 3 (Week 2): Pt will complete LB dressing with min assist including shoes OT Short Term Goal 4 (Week 2): Pt will complete toilet transfer wtith supervision  Skilled Therapeutic Interventions/Progress Updates:    Pt seen for ADL retraining with focus on transfers, sit <> stand, and increased use of RUE in self-care tasks.  Upon arrival pt reports needing to toilet, performed stand pivot transfer from w/c to toilet with use of grab bar with min assist.  Pt completed bathing at sit to stand level in shower from tub bench with use of grab bar for steadying with transition from sit to stand and in standing when washing buttocks.  Pt required hand over hand assist to initiate grasp of grab bar with Rt hand, pt unable to maintain tight grasp.  Pt with increased spontaneous use of RUE with lathering soap on washcloth and washing with washcloth.  Pt continues to require max verbal and demonstration cues with UB dressing with pt unable to carryover orientation of shirt and consistently threads arms backwards through end of sleeve.  Pt with increased use of RUE with holding deodorant and toothpaste when opening each cap with Lt hand.   Therapy Documentation Precautions:  Precautions Precautions: Fall Precaution Comments: right hemipareisis, motor apraxia Restrictions Weight Bearing Restrictions: No General:   Vital Signs: Therapy Vitals Temp: 98.3 F (36.8 C) Temp src: Oral Pulse Rate: 89 Resp: 18 BP: 118/83 mmHg Patient Position, if appropriate: Sitting Oxygen Therapy SpO2: 94 %  O2 Device: None (Room air) Pain: Pain Assessment Pain Assessment: No/denies pain Pain Score: 0-No pain Faces Pain Scale: No hurt ADL: ADL Grooming: Setup Where Assessed-Grooming: Sitting at sink Upper Body Bathing: Setup Where Assessed-Upper Body Bathing: Shower Lower Body Bathing: Minimal assistance Where Assessed-Lower Body  Bathing: Shower Upper Body Dressing: Minimal assistance Where Assessed-Upper Body Dressing: Sitting at sink Lower Body Dressing: Minimal assistance Where Assessed-Lower Body Dressing: Sitting at sink;Standing at sink Toileting: Moderate assistance Where Assessed-Toileting: Teacher, adult education: Curator Method: Surveyor, minerals: Grab bars;Bedside commode Tub/Shower Transfer: Minimal assistance Tub/Shower Transfer Method: Stand pivot Tub/Shower Equipment: Transfer tub bench;Grab bars;Walk in shower Walk-In Shower Transfer: Moderate assistance Film/video editor Method: Warden/ranger: Transfer tub bench;Grab bars  See FIM for current functional status  Therapy/Group: Individual Therapy  Leonette Monarch 10/27/2012, 11:12 AM

## 2012-10-27 NOTE — Progress Notes (Signed)
NUTRITION FOLLOW UP  Intervention:    Continue Magic cups daily with meal trays.  Continue Ensure pudding TID.  Nutrition Dx:   Inadequate oral intake related to dislike of therapeutic diet as evidenced by pt report. Resolved with diet upgrade.  No new nutrition diagnosis at this time.  Goal:   Intake to meet >90% of estimated nutrition needs. Met.  Monitor:   PO intake, weight trend.  Assessment:   Diet has been upgraded to Dysphagia 3 with thin liquids. Patient eats well sometimes and poorly other times per RN. Is receiving medications with Ensure Pudding. Meal completion is 75-85%. Current intake with meals and supplements is adequate to meet nutrition needs.  Height: Ht Readings from Last 1 Encounters:  10/20/12 5\' 7"  (1.702 m)    Weight Status:   Wt Readings from Last 1 Encounters:  10/20/12 180 lb 12.4 oz (82 kg)   No new weight available.  Body mass index is 28.31 kg/(m^2). overweight  Re-estimated needs:  Kcal: 1800-2000  Protein: 80-95g  Fluid: >1.8 L/day  Skin: no problems noted  Diet Order: Dysphagia 3 with thin liquids   Intake/Output Summary (Last 24 hours) at 10/27/12 1233 Last data filed at 10/27/12 0700  Gross per 24 hour  Intake    960 ml  Output      0 ml  Net    960 ml    Last BM: 5/8   Labs:   Recent Labs Lab 10/23/12 0518 10/27/12 0625  NA 142  --   K 3.7  --   CL 101  --   CO2 34*  --   BUN 14  --   CREATININE 0.83 0.78  CALCIUM 9.6  --   GLUCOSE 102*  --     CBG (last 3)  No results found for this basename: GLUCAP,  in the last 72 hours  Scheduled Meds: . antiseptic oral rinse  15 mL Mouth Rinse BID  . aspirin  325 mg Oral Daily  . atorvastatin  40 mg Oral q1800  . clopidogrel  75 mg Oral Q breakfast  . enoxaparin (LOVENOX) injection  40 mg Subcutaneous Q24H  . feeding supplement  1 Container Oral TID BM  . loratadine  10 mg Oral Daily  . nicotine  14 mg Transdermal Daily  . venlafaxine  25 mg Oral TID WC     Continuous Infusions:   Joaquin Courts, RD, LDN, CNSC Pager [phone] After Hours Pager [phone]

## 2012-10-27 NOTE — Progress Notes (Signed)
Patient ID: Tiffany [name], female   DOB: [birth year], 62 y.o.   MRN: [id number] Subjective/Complaints: 62 y.o. RH- female with history of CAD, HTN, TIA event a month ago; who developed difficulty speaking with right sided weakness on am of 10/16/12. CCT with bilateral subcortical hypodensities and patient treated with TPA. Carotid dopplers with <80% right ICA stenosis. 2 D echo with EF 60-65% and grade one dysfunction. Around midnight on 04/29 patient with progression of symptoms with dense right hemiparesis with inability to follow commands. Follow up CCT with developing focus of low attenuation left insular cortex and deep left frontal white matter, no hemorrhage. MRI brain done revealing L-MCA territory infarct affecting insula, operculum, left motor strip, superior parietal lobe, and left splenium. MRA brain with LMCA occlusion in M1 segment with no distal flow signal. 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 rate 18, height 5\' 7"  (1.702 m), weight 82 kg (180 lb 12.4 oz), SpO2 91.00%. No results found. Results for orders placed during the hospital encounter of 10/20/12 (from the past 72 hour(s))  CREATININE, SERUM     Status: Abnormal   Collection Time    10/27/12  6:25 AM      Result Value Range   Creatinine, Ser 0.78  0.50 - 1.10 mg/dL   GFR calc non Af Amer 88 (*) >90 mL/min   GFR calc Af Amer >90  >90 mL/min   Comment:            The eGFR has been calculated     using the CKD EPI equation.     This calculation has not been     validated in all clinical     situations.     eGFR's persistently     <90 mL/min signify     possible Chronic Kidney Disease.     HEENT: normal Cardio: RRR and no murmur Resp: CTA B/L and unlabored GI: BS positive and non tender Extremity:   Pulses positive and No Edema Skin:   Intact Neuro: Flat, Cranial Nerve Abnormalities R central 7, Abnormal Sensory difficult to assess secondary to speech, Abnormal Motor 2-/5 in RUE, 3-/5 RLE, 5/5 on Left side, Abnormal FMC Ataxic/ dec FMC and Aphasic Musc/Skel:  Normal Gen NAD.  Names 4/4 simple objects,also names stethoscope Assessment/Plan: 1. 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 Physiatrist is providing close team supervision and 24 hour management of active medical problems listed below. Physiatrist and rehab team continue to assess barriers to discharge/monitor patient progress toward functional and medical goals. FIM: FIM - Bathing Bathing Steps Patient Completed: Chest;Right Arm;Abdomen;Front perineal area;Buttocks;Right upper leg;Left upper leg;Right lower leg (including foot);Left lower leg (including foot) Bathing: 4: Min-Patient completes 8-9 35f 10 parts or 75+ percent  FIM - Upper Body Dressing/Undressing Upper body dressing/undressing steps patient completed: Thread/unthread right bra strap;Thread/unthread left bra strap;Thread/unthread right sleeve of pullover shirt/dresss;Thread/unthread left sleeve of pullover shirt/dress;Put head through opening of pull over shirt/dress;Pull shirt over trunk Upper body dressing/undressing: 4: Min-Patient completed 75 plus % of tasks FIM - Lower Body Dressing/Undressing Lower body dressing/undressing steps patient completed: Thread/unthread right pants leg;Thread/unthread left pants leg;Pull pants up/down Lower body dressing/undressing: 4: Min-Patient completed 75 plus % of tasks  FIM - Toileting Toileting steps completed by patient: Adjust clothing prior to toileting Toileting Assistive Devices: Grab bar or rail for support Toileting: 1: Total-Patient completed zero steps, helper did all 3  FIM - Quarry manager Devices: Bedside commode Toilet Transfers: 3-To toilet/BSC: Mod A (lift or lower assist);2-From toilet/BSC: Max A (lift and lower assist)  FIM - Banker Devices: Walker;Arm rests Bed/Chair Transfer: 3: Chair or W/C > Bed: Mod A (lift or lower assist);3: Bed > Chair or W/C: Mod A (lift or lower assist)  FIM - Locomotion: Wheelchair Locomotion: Wheelchair: 1: Total Assistance/staff pushes wheelchair (Pt<25%) FIM - Locomotion: Ambulation Locomotion: Ambulation Assistive Devices: Walker - Platform;Orthosis Ambulation/Gait Assistance: 3: Mod assist Locomotion: Ambulation: 2: Travels 50 - 149 ft with moderate assistance (Pt: 50 - 74%)  Comprehension Comprehension Mode: Auditory Comprehension: 5-Follows basic conversation/direction: With no assist  Expression Expression Mode: Verbal Expression: 4-Expresses basic 75 - 89% of the time/requires cueing 10 - 24% of the time. Needs helper to occlude trach/needs to repeat words.  Social Interaction Social Interaction: 6-Interacts appropriately with others with medication or extra time (anti-anxiety, antidepressant).  Problem Solving Problem Solving: 5-Solves basic problems: With no assist  Memory Memory: 5-Recognizes or recalls 90% of the time/requires cueing < 10% of the time  Medical Problem List and Plan:  1. DVT Prophylaxis/Anticoagulation: Pharmaceutical: Lovenox  2. Pain Management: Will resume norco prn for headaches.  3. Depression: . Will resume Effexor and klonopin as at home. Team to continue to provide Ego support and encouragement. Will have LCSW and psychologist follow for support.  4. Neuropsych: This patient is capable of making decisions on her own behalf.  5. HTN: will monitor with bid checks. Continue labetalol. Titrate medications as needed for better control.  6. Dyslipidemia: Continue Lipitor.  7. CAD: Will continue lipitor and ASA. Monitor for symptoms with  activity.  8. Dysphagia: Needs full supervision at meals. Will continue Modified diet per ST recommendations 9.  Bowel incont  LOS (Days) 7 A FACE TO FACE EVALUATION WAS PERFORMED  Noor Vidales E 10/27/2012, 8:32 AM

## 2012-10-27 NOTE — Progress Notes (Signed)
Occupational Therapy Session Note  Patient Details  Name: Tiffany [name] MRN: [id number] Date of Birth: September 25, 1950  Today's Date: 10/27/2012 Time: 1130-1145 Time Calculation (min): 15 min  Short Term Goals: Week 1:  OT Short Term Goal 1 (Week 1): Pt. will bathe UB at supervision level OT Short Term Goal 1 - Progress (Week 1): Met OT Short Term Goal 2 (Week 1): Pt. will bathe LB at minimal assist level with sit to stand OT Short Term Goal 2 - Progress (Week 1): Met OT Short Term Goal 3 (Week 1): Pt. will use RUE as gross assist level during ADL OT Short Term Goal 3 - Progress (Week 1): Met OT Short Term Goal 4 (Week 1): Pt. will transfer to toilet at minimal assist level OT Short Term Goal 4 - Progress (Week 1): Met  Skilled Therapeutic Interventions/Progress Updates:    Pt seen for group therapy session in [employer] today with focus on self feeding, functional use of R hand, use of R as stabilizer, self feeding and swallowing strategies. Pt ate sandwich today w/o noted difficulty swallowing. She initiated keeping RUE on table throughout session. Required cues to use RUE to assist with holding sandwich. Decreased grip however pt practice elbow flexion on several occasion to bring sandwich to mouth. Required cues to use fork for dessert as she was having difficulty eating dessert with spoon.    Therapy Documentation Precautions:  Precautions Precautions: Fall Precaution Comments: right hemipareisis, motor apraxia Restrictions Weight Bearing Restrictions: No General:   Vital Signs:   Pain: Pain Assessment Pain Assessment: No/denies pain Pain Score: 0-No pain ADL: ADL Grooming: Setup Where Assessed-Grooming: Sitting at sink Upper Body Bathing: Setup Where Assessed-Upper Body Bathing: Shower Lower Body Bathing: Minimal assistance Where Assessed-Lower Body Bathing: Shower Upper Body Dressing: Minimal assistance Where Assessed-Upper Body Dressing: Sitting at sink Lower Body  Dressing: Minimal assistance Where Assessed-Lower Body Dressing: Sitting at sink;Standing at sink Toileting: Moderate assistance Where Assessed-Toileting: Teacher, adult education: Curator Method: Surveyor, minerals: Grab bars;Bedside commode Tub/Shower Transfer: Minimal assistance Tub/Shower Transfer Method: Stand pivot Tub/Shower Equipment: Transfer tub bench;Grab bars;Walk in shower Walk-In Shower Transfer: Moderate assistance Film/video editor Method: Warden/ranger: Transfer tub bench;Grab bars Exercises:   Other Treatments:    See FIM for current functional status  Therapy/Group: Co-Treatment and Group Therapy  Dylann Gallier N 10/27/2012, 1:27 PM

## 2012-10-28 ENCOUNTER — Inpatient Hospital Stay (HOSPITAL_COMMUNITY): Payer: BC Managed Care – PPO

## 2012-10-28 ENCOUNTER — Inpatient Hospital Stay (HOSPITAL_COMMUNITY): Payer: BC Managed Care – PPO | Admitting: Speech Pathology

## 2012-10-28 DIAGNOSIS — I6350 Cerebral infarction due to unspecified occlusion or stenosis of unspecified cerebral artery: Secondary | ICD-10-CM

## 2012-10-28 DIAGNOSIS — F329 Major depressive disorder, single episode, unspecified: Secondary | ICD-10-CM

## 2012-10-28 DIAGNOSIS — E785 Hyperlipidemia, unspecified: Secondary | ICD-10-CM

## 2012-10-28 NOTE — Progress Notes (Signed)
Occupational Therapy Session Note  Patient Details  Name: Tiffany [name] MRN: [id number] Date of Birth: [birth year]  Today's Date: 10/28/2012 Time::  1130-1145  (15 min)     Short Term Goals: Week 1:  OT Short Term Goal 1 (Week 1): Pt. will bathe UB at supervision level OT Short Term Goal 1 - Progress (Week 1): Met OT Short Term Goal 2 (Week 1): Pt. will bathe LB at minimal assist level with sit to stand OT Short Term Goal 2 - Progress (Week 1): Met OT Short Term Goal 3 (Week 1): Pt. will use RUE as gross assist level during ADL OT Short Term Goal 3 - Progress (Week 1): Met OT Short Term Goal 4 (Week 1): Pt. will transfer to toilet at minimal assist level OT Short Term Goal 4 - Progress (Week 1): Met Week 2:  OT Short Term Goal 1 (Week 2): Pt will complete bathing at supervision level OT Short Term Goal 2 (Week 2): Pt will complete UB dressing with supervision/cues OT Short Term Goal 3 (Week 2): Pt will complete LB dressing with min assist including shoes OT Short Term Goal 4 (Week 2): Pt will complete toilet transfer wtith supervision  Skilled Therapeutic Interventions/Progress Updates:     Engaged in group session with Speech Therapy.  Treatment focused on safe swallowing and self feeding with primarily LUE but incorporating RUE as well.  Pt.ate with RUE for roll with mod assist.  Provided red gripper to use with RUE for noodles.  Pt used with mod assist.  Husband came in near end and educated him on the AE use and practice.     Treatment Therapy Documentation Precautions:  Precautions Precautions: Fall Precaution Comments: right hemipareisis, motor apraxia Restrictions Weight Bearing Restrictions: No       Pain: Pain Assessment Pain Assessment: No/denies pain ADL:   Therapy/Group: Individual Therapy and Group Therapy  Humberto Seals 10/28/2012, 12:35 PM

## 2012-10-28 NOTE — Progress Notes (Signed)
Speech Language Pathology Daily Session Note  Patient Details  Name: Tiffany [name] MRN: [id number] Date of Birth: 10-06-1950  Today's Date: 10/28/2012 Time: 1478-2956 Time Calculation (min): 20 min  Short Term Goals: Week 2: SLP Short Term Goal 1 (Week 2): Pt will consume Regular textures and demonstrate efficient mastication without overt s/s of aspiration with supervision level verbal cues. SLP Short Term Goal 2 (Week 2): Pt will follow 2 step commands with Supervision verbal and question cues with 80% accuracy.  SLP Short Term Goal 3 (Week 2): Pt will generate grammatically correct sentences with Min assist verbal cues.  SLP Short Term Goal 4 (Week 2): Pt will perform oral-motor exercises to increase lingual and labial ROM and strength with Modified Independence verbal cues.  SLP Short Term Goal 5 (Week 2): Pt will attend to right field of enviornment during functional tasks with supervision verbal cues.   Skilled Therapeutic Interventions: Group, co-treatment session with OT to focus on safety with swallowing and self-feeding. SLP facilitated session with Dys.3 textures and thin liquids via cup; Set-up assist for tray; Supervision verbal cues to self-monitor and compensate for right-sided anterior loss. Patient demonstrated no overt s/s of aspiration throughout meal. SLP also facilitated session with trial of Regular textures (cracker with peanut butter) patient managed with increased time, effortful mastication and manipulation as well as Min assist clinic cues. Continue with current plan of care.    FIM:  Expression Expression: 5-Expresses basic 90% of the time/requires cueing < 10% of the time. Memory Memory: 5-Requires cues to use assistive device FIM - Eating Eating Activity: 5: Set-up assist for open containers  Pain Pain Assessment Pain Assessment: No/denies pain  Therapy/Group: Group Therapy  Charlane Ferretti., CCC-SLP [phone]  Tiffany [name] 10/28/2012, 12:33  PM

## 2012-10-28 NOTE — Progress Notes (Signed)
Speech Language Pathology Weekly Progress Note  Patient Details  Name: Tiffany [name] MRN: [id number] Date of Birth: April 16, 1951  Today's Date: 10/28/2012  Short Term Goals: Week 1: SLP Short Term Goal 1 (Week 1): Pt will consume trials of Dys. 3 textures and demonstrate efficient mastication without overt s/s of aspiration with Min verbal cues. SLP Short Term Goal 1 - Progress (Week 1): Met SLP Short Term Goal 2 (Week 1): Pt will utilize swallowing compensatory strategies to minimize overt s/s of aspiration with current diet with Mod I.  SLP Short Term Goal 2 - Progress (Week 1): Met SLP Short Term Goal 3 (Week 1): Pt will follow 2 step commands with Min A verbal and question cues with 80% accuracy.  SLP Short Term Goal 3 - Progress (Week 1): Met SLP Short Term Goal 4 (Week 1): Pt will verbalize wants/needs at the phrase level with Mod A multimodal cueing.  SLP Short Term Goal 4 - Progress (Week 1): Met SLP Short Term Goal 5 (Week 1): Pt will perform oral-motor exercises to increase lingual and labial ROM and strength with supervision verbal cues.  SLP Short Term Goal 5 - Progress (Week 1): Met SLP Short Term Goal 6 (Week 1): Pt will attend to right field of enviornment during functional tasks with supervision verbal cues.  SLP Short Term Goal 6 - Progress (Week 1): Progressing toward goal Week 2: SLP Short Term Goal 1 (Week 2): Pt will consume Regular textures and demonstrate efficient mastication without overt s/s of aspiration with supervision level verbal cues. SLP Short Term Goal 2 (Week 2): Pt will follow 2 step commands with Supervision verbal and question cues with 80% accuracy.  SLP Short Term Goal 3 (Week 2): Pt will generate grammatically correct sentences with Min assist verbal cues.  SLP Short Term Goal 4 (Week 2): Pt will perform oral-motor exercises to increase lingual and labial ROM and strength with Modified Independence verbal cues.  SLP Short Term Goal 5 (Week 2): Pt will  attend to right field of enviornment during functional tasks with supervision verbal cues.   Weekly Progress Updates: Patient met 5 out of 6 short term goals this reporting period due to gains in oral motor strength resulting in diet advancement and increased speech intelligibility.  Patient also progressed with receptive and expressive language abilities and overall ability to communicate needs and wants.  However, patient continues to require min assist for overall communication and would continue to benefit from skilled SLP service to address these deficits as well as to continue to address least restrictive p.o. intake.  As a result patient continues to require services during the next reporting period.    SLP Intensity: Minumum of 1-2 x/day, 30 to 90 minutes SLP Frequency: 5 out of 7 days SLP Duration/Estimated Length of Stay:  2 weeks SLP Treatment/Interventions: Cognitive remediation/compensation;Cueing hierarchy;Dysphagia/aspiration precaution training;Functional tasks;Patient/family education;Therapeutic Activities;Speech/Language facilitation;Therapeutic Exercise;Oral motor exercises;Neuromuscular electrical stimulation;Internal/external aids;Environmental controls  Charlane Ferretti., CCC-SLP [phone]  [name] 10/28/2012, 11:27 AM

## 2012-10-28 NOTE — Progress Notes (Signed)
Occupational Therapy Session Note  Patient Details  Name: Tiffany [name] MRN: [id number] Date of Birth: 1950-11-12  Today's Date: 10/28/2012 Time: 1000-1100 Time Calculation (min): 60 min  Short Term Goals: Week 2:  OT Short Term Goal 1 (Week 2): Pt will complete bathing at supervision level OT Short Term Goal 2 (Week 2): Pt will complete UB dressing with supervision/cues OT Short Term Goal 3 (Week 2): Pt will complete LB dressing with min assist including shoes OT Short Term Goal 4 (Week 2): Pt will complete toilet transfer wtith supervision  Skilled Therapeutic Interventions: Patient participated in therapeutic group activity "NEURGAMEX" this date with supervision and min assist to perform exercises correctly.  Patient completed active assisted R-UE exercises to improve blood flow, reduce edema, promote improved integration of sensory and motor function, improve body awareness and improve body symmetry.   Patient participated fully and interacted appropriately with group members and staff present.    Therapy Documentation Precautions:  Precautions Precautions: Fall Precaution Comments: right hemipareisis, motor apraxia Restrictions Weight Bearing Restrictions: No  Pain: Pain Assessment Pain Assessment: No/denies pain  See FIM for current functional status  Therapy/Group: Group Therapy  Georgeanne Nim 10/28/2012, 11:15 AM

## 2012-10-28 NOTE — Progress Notes (Signed)
Tiffany [name] is a 62 y.o. female April 23, 1951 [id number]  Subjective: No new complaints. No new problems. Slept well. Feeling OK.  Objective: Vital signs in last 24 hours: Temp:  [98 F (36.7 C)-98.3 F (36.8 C)] 98 F (36.7 C) (05/10 0520) Pulse Rate:  [72-91] 91 (05/10 0520) Resp:  [18] 18 (05/10 0520) BP: (112-118)/(72-83) 114/72 mmHg (05/10 0520) SpO2:  [94 %-99 %] 98 % (05/10 0520) Weight change:  Last BM Date: 10/26/12  Intake/Output from previous day: 05/09 0701 - 05/10 0700 In: 600 [P.O.:600] Out: -  Last cbgs: CBG (last 3)  No results found for this basename: GLUCAP,  in the last 72 hours   Physical Exam General: No apparent distress    HEENT: moist mucosa Lungs: Normal effort. Lungs clear to auscultation, no crackles or wheezes. Cardiovascular: Regular rate and rhythm, no edema Musculoskeletal:  No change from before Neurological: No new neurological deficits Wounds: N/A    Skin: clear Alert, cooperative   Lab Results: BMET    Component Value Date/Time   NA 142 10/23/2012 0518   K 3.7 10/23/2012 0518   CL 101 10/23/2012 0518   CO2 34* 10/23/2012 0518   GLUCOSE 102* 10/23/2012 0518   BUN 14 10/23/2012 0518   CREATININE 0.78 10/27/2012 0625   CALCIUM 9.6 10/23/2012 0518   GFRNONAA 88* 10/27/2012 0625   GFRAA >90 10/27/2012 0625   CBC    Component Value Date/Time   WBC 11.4* 10/23/2012 0518   RBC 4.42 10/23/2012 0518   HGB 13.9 10/23/2012 0518   HCT 39.8 10/23/2012 0518   PLT 308 10/23/2012 0518   MCV 90.0 10/23/2012 0518   MCH 31.4 10/23/2012 0518   MCHC 34.9 10/23/2012 0518   RDW 13.2 10/23/2012 0518   LYMPHSABS 4.2* 10/23/2012 0518   MONOABS 1.4* 10/23/2012 0518   EOSABS 0.4 10/23/2012 0518   BASOSABS 0.0 10/23/2012 0518    Studies/Results: No results found.  Medications: I have reviewed the patient's current medications.  Assessment/Plan:  1. DVT Prophylaxis/Anticoagulation: Pharmaceutical: Lovenox  2. Pain Management: Will resume norco prn for headaches.  3.  Depression: . Will resume Effexor and klonopin as at home. Team to continue to provide Ego support and encouragement. Will have LCSW and psychologist follow for support.  4. Neuropsych: This patient is capable of making decisions on her own behalf.  5. HTN: will monitor with bid checks. Continue labetalol. Titrate medications as needed for better control.  6. Dyslipidemia: Continue Lipitor.  7. CAD: Will continue lipitor and ASA. Monitor for symptoms with activity.  8. Dysphagia: Needs full supervision at meals. Will continue Modified diet per ST recommendations  9. Bowel incontinence.     Length of stay, days: 8  Sonda Primes , MD 10/28/2012, 8:52 AM

## 2012-10-29 ENCOUNTER — Inpatient Hospital Stay (HOSPITAL_COMMUNITY): Payer: BC Managed Care – PPO | Admitting: Physical Therapy

## 2012-10-29 DIAGNOSIS — I10 Essential (primary) hypertension: Secondary | ICD-10-CM

## 2012-10-29 NOTE — Progress Notes (Signed)
Physical Therapy Note  Patient Details  Name: Tiffany [name] MRN: [id number] Date of Birth: 1951-04-13 Today's Date: 10/29/2012  4098-1191 (45 minutes) individual Pain: no complaint of pain Focus of treatment: Therapeutic exercise focused on RT UE/LE control/strengthening; Treatment: Transfers stand/turn min assist (Allard brace RT LE); Nustep Level 4 X 10 minutes with ace wrap RT hand; Therapeutic exercise- Rt hip flexion x 15 to 12 inch step; Rt hip abduction in standing x 15; partial squats,up/down 4 inch step Rt LE only for quad control with delayed extension during stance; gait - 75 feet HHA on right min assist , gait sideways HHA with mod delay during adduction; gait backwards mod assist for balance secondary to delay during extension and decreased weight shift to left with increased foot drag on right.    Chenel Wernli,JIM 10/29/2012, 8:57 AM

## 2012-10-29 NOTE — Progress Notes (Signed)
Tiffany [name] is a 62 y.o. female Jan 10, 1951 [id number]  Subjective: No new complaints. No new problems. Slept well. Feeling OK overall.  Objective: Vital signs in last 24 hours: Temp:  [98.1 F (36.7 C)] 98.1 F (36.7 C) (05/11 0542) Pulse Rate:  [94-104] 94 (05/11 0542) Resp:  [19-20] 20 (05/11 0542) BP: (130-139)/(77-84) 130/77 mmHg (05/11 0542) SpO2:  [93 %] 93 % (05/11 0542) Weight change:  Last BM Date: 10/28/12  Intake/Output from previous day: 05/10 0701 - 05/11 0700 In: 1080 [P.O.:1080] Out: -  Last cbgs: CBG (last 3)  No results found for this basename: GLUCAP,  in the last 72 hours   Physical Exam General: No apparent distress    HEENT: moist mucosa Lungs: Normal effort. Lungs clear to auscultation, no crackles or wheezes. Cardiovascular: Regular rate and rhythm, no edema Musculoskeletal:  No change from before Neurological: No new neurological deficits Wounds: N/A    Skin: clear Alert, cooperative   Lab Results: BMET    Component Value Date/Time   NA 142 10/23/2012 0518   K 3.7 10/23/2012 0518   CL 101 10/23/2012 0518   CO2 34* 10/23/2012 0518   GLUCOSE 102* 10/23/2012 0518   BUN 14 10/23/2012 0518   CREATININE 0.78 10/27/2012 0625   CALCIUM 9.6 10/23/2012 0518   GFRNONAA 88* 10/27/2012 0625   GFRAA >90 10/27/2012 0625   CBC    Component Value Date/Time   WBC 11.4* 10/23/2012 0518   RBC 4.42 10/23/2012 0518   HGB 13.9 10/23/2012 0518   HCT 39.8 10/23/2012 0518   PLT 308 10/23/2012 0518   MCV 90.0 10/23/2012 0518   MCH 31.4 10/23/2012 0518   MCHC 34.9 10/23/2012 0518   RDW 13.2 10/23/2012 0518   LYMPHSABS 4.2* 10/23/2012 0518   MONOABS 1.4* 10/23/2012 0518   EOSABS 0.4 10/23/2012 0518   BASOSABS 0.0 10/23/2012 0518    Studies/Results: No results found.  Medications: I have reviewed the patient's current medications.  Assessment/Plan:  1. DVT Prophylaxis/Anticoagulation: Pharmaceutical: Lovenox  2. Pain Management: Will resume norco prn for headaches.  3. Depression: .  Will resume Effexor and klonopin as at home. Team to continue to provide Ego support and encouragement. Will have LCSW and psychologist follow for support.  4. Neuropsych: This patient is capable of making decisions on her own behalf.  5. HTN: will monitor with bid checks. Continue Rx  6. Dyslipidemia: Continue Lipitor.  7. CAD: Will continue lipitor and ASA. Monitor for symptoms with activity.  8. Dysphagia: Needs full supervision at meals. Will continue Modified diet per ST recommendations  9. Bowel incontinence.     Length of stay, days: 9  Sonda Primes , MD 10/29/2012, 9:07 AM

## 2012-10-30 ENCOUNTER — Inpatient Hospital Stay (HOSPITAL_COMMUNITY): Payer: BC Managed Care – PPO | Admitting: Speech Pathology

## 2012-10-30 ENCOUNTER — Inpatient Hospital Stay (HOSPITAL_COMMUNITY): Payer: BC Managed Care – PPO | Admitting: Occupational Therapy

## 2012-10-30 ENCOUNTER — Inpatient Hospital Stay (HOSPITAL_COMMUNITY): Payer: BC Managed Care – PPO

## 2012-10-30 ENCOUNTER — Inpatient Hospital Stay (HOSPITAL_COMMUNITY): Payer: BC Managed Care – PPO | Admitting: Physical Therapy

## 2012-10-30 DIAGNOSIS — G8110 Spastic hemiplegia affecting unspecified side: Secondary | ICD-10-CM

## 2012-10-30 DIAGNOSIS — I69920 Aphasia following unspecified cerebrovascular disease: Secondary | ICD-10-CM

## 2012-10-30 DIAGNOSIS — I6330 Cerebral infarction due to thrombosis of unspecified cerebral artery: Secondary | ICD-10-CM

## 2012-10-30 LAB — BASIC METABOLIC PANEL
BUN: 14 mg/dL (ref 6–23)
CO2: 26 mEq/L (ref 19–32)
Chloride: 101 mEq/L (ref 96–112)
GFR calc non Af Amer: >90 mL/min (ref >90)
Glucose, Bld: 139 mg/dL — ABNORMAL HIGH (ref 70–99)
Potassium: 4 mEq/L (ref 3.5–5.1)
Sodium: 138 mEq/L (ref 135–145)

## 2012-10-30 MED ORDER — CLONAZEPAM 0.5 MG PO TABS
0.2500 mg | ORAL_TABLET | Freq: Three times a day (TID) | ORAL | Status: DC
  Administered 2012-10-30 – 2012-10-31 (×4): 0.25 mg via ORAL
  Filled 2012-10-30 (×4): qty 1

## 2012-10-30 MED ORDER — CLONAZEPAM 0.5 MG PO TABS: 0.2500 mg | ORAL_TABLET | Freq: Three times a day (TID) | ORAL | Status: DC | PRN

## 2012-10-30 NOTE — Progress Notes (Signed)
Patient reports she took Klonopin tid at home. Will schedule.

## 2012-10-30 NOTE — Progress Notes (Signed)
Speech Language Pathology Daily Session Note  Patient Details  Name: Tiffany [name] MRN: [id number] Date of Birth: 1950/12/20  Today's Date: 10/30/2012 Time: 1130-1145 Time Calculation (min): 15 min  Short Term Goals: Week 2: SLP Short Term Goal 1 (Week 2): Pt will consume Regular textures and demonstrate efficient mastication without overt s/s of aspiration with supervision level verbal cues. SLP Short Term Goal 2 (Week 2): Pt will follow 2 step commands with Supervision verbal and question cues with 80% accuracy.  SLP Short Term Goal 3 (Week 2): Pt will generate grammatically correct sentences with Min assist verbal cues.  SLP Short Term Goal 4 (Week 2): Pt will perform oral-motor exercises to increase lingual and labial ROM and strength with Modified Independence verbal cues.  SLP Short Term Goal 5 (Week 2): Pt will attend to right field of enviornment during functional tasks with supervision verbal cues.   Skilled Therapeutic Interventions: Group, co-treatment session with OT to focus on safety with swallowing and self-feeding. SLP facilitated session with Dys.3 textures and thin liquids via cup; Set-up assist for tray; Pt was able to self-monitor and compensate for right-sided anterior loss with Mod I. Patient demonstrated no overt s/s of aspiration throughout meal. Pt also demonstrated appropriate verbal expression and social interaction with other group members with extra time. Continue with current plan of care.    FIM:  FIM - Eating Eating Activity: 5: Set-up assist for open containers  Pain Pain Assessment Pain Assessment: No/denies pain  Therapy/Group: Group Therapy  Roy Snuffer 10/30/2012, 4:45 PM

## 2012-10-30 NOTE — Progress Notes (Signed)
Occupational Therapy Session Note  Patient Details  Name: Tiffany [name] MRN: [id number] Date of Birth: 08/24/1950  Today's Date: 10/30/2012 Time: 0930-1030 and 4098-1191 Time Calculation (min): 60 min and 28 min  Short Term Goals: Week 2:  OT Short Term Goal 1 (Week 2): Pt will complete bathing at supervision level OT Short Term Goal 2 (Week 2): Pt will complete UB dressing with supervision/cues OT Short Term Goal 3 (Week 2): Pt will complete LB dressing with min assist including shoes OT Short Term Goal 4 (Week 2): Pt will complete toilet transfer wtith supervision  Skilled Therapeutic Interventions/Progress Updates:    1) Pt seen for ADL retraining with focus on transfers, sit <> stand, and carryover of hemi-technique with bathing and dressing tasks.  Pt tearful throughout session, reports feeling claustrophobic and overwhelmed.  Bathing completed at sit to stand level in room walk-in shower.  Pt completed stand pivot transfer with use of grab bar with min/steady assist.  Pt with increased carryover of functional use of RUE with bathing and dressing this session.  Educated pt on alternate ways to don bra to not have to hook behind with pt return demonstrating pull over head method.  Pt donned socks and shoes this session.  Issued shoe button to assist with fastening shoes.    2) Pt seen for 1:1 OT with focus on propelling w/c and transfers from various surfaces. Navigated w/c to RN station with cues to use LLE to assist in steering while propelling w/c with only LUE.  Assisted pt with maneuvering obstacles as she tended to veer to the Lt.  Completed transfers to various surfaces in hospital solarium and discussed modification options to furniture to increase height of low chairs and encouraged use of chairs with arm rests to assist in ease of transfer.  Pt required mod assist sit to stand from low chair, min/steady from w/c to each surface.  Therapy Documentation Precautions:   Precautions Precautions: Fall Precaution Comments: right hemipareisis, motor apraxia Restrictions Weight Bearing Restrictions: No (apraxia) General:   Vital Signs: Therapy Vitals Pulse Rate: 100 BP: 145/83 mmHg Patient Position, if appropriate: Sitting Pain:  Pt with no c/o pain this session.  See FIM for current functional status  Therapy/Group: Individual Therapy  Leonette Monarch 10/30/2012, 11:00 AM

## 2012-10-30 NOTE — Progress Notes (Signed)
Occupational Therapy Note  Patient Details  Name: Tiffany [name] MRN: [id number] Date of Birth: 1951-04-04 Today's Date: 10/30/2012  Time: 4098-1191 (cotx with Speech Therapy-total time 1130-1205) Pt denies pain Group Therapy  Pt participated in self feeding group with focus on using RUE as stabilizer to assist with opening containers and hold containers.  Pt used LUE for all self feeding.  Pt interacted appropriately with all participants and exhibited no difficulty with her DIII diet.   Lavone Neri [facility] 10/30/2012, 3:42 PM

## 2012-10-30 NOTE — Progress Notes (Signed)
Patient ID: Tiffany [name], female   DOB: 1951-06-20, 62 y.o.   MRN: [id number] Subjective/Complaints: 62 y.o. RH- female with history of CAD, HTN, TIA event a month ago; who developed difficulty speaking with right sided weakness on am of 10/16/12. CCT with bilateral subcortical hypodensities and patient treated with TPA. Carotid dopplers with <80% right ICA stenosis. 2 D echo with EF 60-65% and grade one dysfunction. Around midnight on 04/29 patient with progression of symptoms with dense right hemiparesis with inability to follow commands. Follow up CCT with developing focus of low attenuation left insular cortex and deep left frontal white matter, no hemorrhage. MRI brain done revealing L-MCA territory infarct affecting insula, operculum, left motor strip, superior parietal lobe, and left splenium. MRA brain with LMCA occlusion in M1 segment with no distal flow signal. 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 rate 18, height 5\' 7"  (1.702 m), weight 82 kg (180 lb 12.4 oz), SpO2 95.00%. No results found. No results found for this or any previous visit (from the past 72 hour(s)).   HEENT: normal Cardio: RRR and no murmur Resp: CTA B/L and unlabored GI: BS positive and non tender Extremity:  Pulses positive and No Edema Skin:   Intact Neuro: Flat, Cranial Nerve Abnormalities R central 7, Abnormal Sensory difficult to assess secondary to speech, Abnormal Motor 2-/5 in RUE, 3-/5 RLE, 5/5 on Left side, Abnormal FMC Ataxic/ dec FMC and Aphasic Musc/Skel:  Normal Gen NAD.  Names 4/4 simple objects,also names stethoscope Assessment/Plan: 1. 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 Physiatrist is providing close team supervision and 24 hour management of active medical problems listed below. Physiatrist and rehab team continue to assess barriers to discharge/monitor patient progress toward functional and medical goals. FIM: FIM - Bathing Bathing Steps Patient Completed: Right Arm;Chest;Left Arm;Abdomen;Front perineal area;Buttocks;Right upper leg;Left upper leg;Right lower leg (including foot);Left lower leg (including foot) Bathing: 4: Steadying assist  FIM - Upper Body Dressing/Undressing Upper body dressing/undressing steps patient completed: Thread/unthread left bra strap;Thread/unthread right sleeve of pullover shirt/dresss;Thread/unthread left sleeve of pullover shirt/dress;Put head through opening of pull over shirt/dress;Pull shirt over trunk Upper body dressing/undressing: 4: Min-Patient completed 75 plus % of tasks FIM - Lower Body Dressing/Undressing Lower body dressing/undressing steps patient completed: Thread/unthread right underwear leg;Thread/unthread left underwear leg;Pull underwear up/down;Thread/unthread right pants leg;Thread/unthread left pants leg;Don/Doff right sock;Don/Doff left sock Lower body dressing/undressing: 4: Min-Patient completed 75 plus % of tasks  FIM - Toileting Toileting steps completed by patient: Performs perineal hygiene;Adjust clothing prior to toileting Toileting Assistive Devices: Grab bar or rail for support Toileting: 3: Mod-Patient completed 2 of 3 steps  FIM - Diplomatic Services operational officer Devices: Grab bars Toilet Transfers: 3-To toilet/BSC: Mod A (lift or lower assist);3-From toilet/BSC: Mod A (lift or lower assist)  FIM - Banker Devices: Walker;Arm rests Bed/Chair Transfer: 5: Supine > Sit: Supervision (verbal cues/safety issues);3: Chair or W/C > Bed: Mod A (lift or lower assist);3: Bed > Chair or W/C: Mod A (lift or lower assist)  FIM - Locomotion:  Wheelchair Locomotion: Wheelchair: 1: Total Assistance/staff pushes wheelchair (Pt<25%) FIM - Locomotion: Ambulation Locomotion: Ambulation Assistive Devices: Walker - Platform;Orthosis Ambulation/Gait Assistance: 3:  Mod assist Locomotion: Ambulation: 2: Travels 50 - 149 ft with moderate assistance (Pt: 50 - 74%)  Comprehension Comprehension Mode: Auditory Comprehension: 5-Follows basic conversation/direction: With no assist  Expression Expression Mode: Verbal Expression: 5-Expresses complex 90% of the time/cues < 10% of the time  Social Interaction Social Interaction: 6-Interacts appropriately with others with medication or extra time (anti-anxiety, antidepressant).  Problem Solving Problem Solving: 5-Solves basic 90% of the time/requires cueing < 10% of the time  Memory Memory: 5-Recognizes or recalls 90% of the time/requires cueing < 10% of the time  Medical Problem List and Plan:  1. DVT Prophylaxis/Anticoagulation: Pharmaceutical: Lovenox  2. Pain Management: Will resume norco prn for headaches.  3. Depression: . Will resume Effexor and klonopin as at home. Team to continue to provide Ego support and encouragement. Will have LCSW and psychologist follow for support.  4. Neuropsych: This patient is capable of making decisions on her own behalf.  5. HTN: will monitor with bid checks. Continue labetalol. Titrate medications as needed for better control.  6. Dyslipidemia: Continue Lipitor.  7. CAD: Will continue lipitor and ASA. Monitor for symptoms with activity.  8. Dysphagia: Needs full supervision at meals. Will continue Modified diet per ST recommendations 9.  Bowel incont  LOS (Days) 10 A FACE TO FACE EVALUATION WAS PERFORMED  Tiffany [name] E 10/30/2012, 9:11 AM

## 2012-10-30 NOTE — Plan of Care (Signed)
Problem: RH PAIN MANAGEMENT Goal: RH STG PAIN MANAGED AT OR BELOW PT'S PAIN GOAL Less than or equal to 3.  Outcome: Not Progressing Chronic back pain and taking baclofen and norco at home PTA

## 2012-10-30 NOTE — Progress Notes (Signed)
Speech Language Pathology Daily Session Note  Patient Details  Name: Tiffany [name] MRN: [id number] Date of Birth: Mar 21, 1951  Today's Date: 10/30/2012 Time: 1100-1130 Time Calculation (min): 30 min  Short Term Goals: Week 2: SLP Short Term Goal 1 (Week 2): Pt will consume Regular textures and demonstrate efficient mastication without overt s/s of aspiration with supervision level verbal cues. SLP Short Term Goal 2 (Week 2): Pt will follow 2 step commands with Supervision verbal and question cues with 80% accuracy.  SLP Short Term Goal 3 (Week 2): Pt will generate grammatically correct sentences with Min assist verbal cues.  SLP Short Term Goal 4 (Week 2): Pt will perform oral-motor exercises to increase lingual and labial ROM and strength with Modified Independence verbal cues.  SLP Short Term Goal 5 (Week 2): Pt will attend to right field of enviornment during functional tasks with supervision verbal cues.   Skilled Therapeutic Interventions: Skilled treatment session focused on dysphagia and cognitive-linguistic goals. SLP facilitated session with regular textures (cracker with peanut butter) and thin liquids via cup sips with min verbal/visual cues for lingual sweep/liquid wash to clear oral residuals. Pt consumed POs with additional time for mastication and without overt  s/s of aspiration. Pt required min verbal cues from SLP to perform two-step commands during functional task. Speech was 100% intellgible to this clincian. Continue current plan of care.   FIM:  Comprehension Comprehension Mode: Auditory Comprehension: 5-Understands basic 90% of the time/requires cueing < 10% of the time Expression Expression Mode: Verbal Expression: 5-Expresses basic 90% of the time/requires cueing < 10% of the time. Social Interaction Social Interaction: 5-Interacts appropriately 90% of the time - Needs monitoring or encouragement for participation or interaction. Problem Solving Problem Solving:  5-Solves basic 90% of the time/requires cueing < 10% of the time Memory Memory: 5-Recognizes or recalls 90% of the time/requires cueing < 10% of the time FIM - Eating Eating Activity: 5: Needs verbal cues/supervision;5: Set-up assist for open containers  Pain Pain Assessment Pain Assessment: 0-10 Pain Score:   5 Pain Location: Back Pain Orientation: Lower Pain Descriptors: Aching Pain Onset: On-going Pain Intervention(s): Medication (See eMAR)  Therapy/Group: Individual Therapy  Maxcine Ham 10/30/2012, 12:35 PM  Maxcine Ham, M.A. CCC-SLP

## 2012-10-30 NOTE — Progress Notes (Signed)
Patient reported to have SOB--felt things were closing in.  Anxious--"two weeks is a long time". Ego support provided. Overall goals reviewed. Will increase klonopin to help with anxiety issues.

## 2012-10-30 NOTE — Progress Notes (Signed)
Physical Therapy Session Note  Patient Details  Name: Tiffany [name] MRN: [id number] Date of Birth: [birth year]  Today's Date: 10/30/2012 Time: 1330-1415 Time Calculation (min): 45 min  Short Term Goals: Week 1:  PT Short Term Goal 1 (Week 1): Patient will be able to perform bed mobility with min-A PT Short Term Goal 1 - Progress (Week 1): Met PT Short Term Goal 2 (Week 1): Patient will be able to perform transfers with min-A PT Short Term Goal 2 - Progress (Week 1): Progressing toward goal PT Short Term Goal 3 (Week 1): Patient will be able to ambulate 20 feet using LRAD with min-A PT Short Term Goal 3 - Progress (Week 1): Progressing toward goal PT Short Term Goal 4 (Week 1): Patient will be able to ascend/descend 4 steps using hand rail with Mod-A PT Short Term Goal 4 - Progress (Week 1): Met Week 2:  PT Short Term Goal 1 (Week 2): Patient will perform bed mobility from flat bed, no rails to L and R consistently with min A PT Short Term Goal 2 (Week 2): Patient will perform bed <> w/c transfers to L and R min A PT Short Term Goal 3 (Week 2): Patient will perform w/c mobility x 150' in controlled environment with hemi technique or bilat foot propulsion with min A PT Short Term Goal 4 (Week 2): Patient will perform gait in controlled environment x 100' with orthosis and LRAD and min A overall PT Short Term Goal 5 (Week 2): Patient will ascend and descend 8 steps with one rail and mod A overall   Skilled Therapeutic Interventions/Progress Updates:   Patient just finishing with OT.  Patient with AFO donned already.  Performed stair negotiation training for how patient will have to perform for home entry/exit; performed 2 sets of up and down 3 short stairs (4" tall) and up/down 2 tallers stairs (6" tall) with LUE support on rail during step to sequence ascending with LLE and descending with RLE; patient required multiple verbal cues to recall safe stepping sequence for safety with AFO.   Required mod A overall and verbal and visual cues for full RLE hip and knee flexion to assist with RLE clearance to next step.  Continued gait training with figure 8 walking, R and L lateral stepping for carry over to pivoting during transfers or changing directions during gait with AFO donned but with LUE HHA only with mod A to focus on attention to R foot initiation of stepping and safe placement, lateral weight shifting, upright posture and activation of RLE extensors in stance for stabilization.  As patient fatigued she became more delayed in her processing of verbal cues and more apraxic; patient does better when she has visual feedback of RLE activation and placement.    Therapy Documentation Precautions:  Precautions Precautions: Fall Precaution Comments: right hemipareisis, motor apraxia Restrictions Weight Bearing Restrictions: No (apraxia) Pain: Pain Assessment Pain Assessment: No/denies pain Locomotion : Ambulation Ambulation/Gait Assistance: 3: Mod assist   See FIM for current functional status  Therapy/Group: Individual Therapy  Edman Circle [facility] 10/30/2012, 4:38 PM

## 2012-10-31 ENCOUNTER — Inpatient Hospital Stay (HOSPITAL_COMMUNITY): Payer: BC Managed Care – PPO | Admitting: Speech Pathology

## 2012-10-31 ENCOUNTER — Inpatient Hospital Stay (HOSPITAL_COMMUNITY): Payer: BC Managed Care – PPO | Admitting: Occupational Therapy

## 2012-10-31 ENCOUNTER — Inpatient Hospital Stay (HOSPITAL_COMMUNITY): Payer: BC Managed Care – PPO | Admitting: Physical Therapy

## 2012-10-31 MED ORDER — CLONAZEPAM 0.5 MG PO TABS
0.2500 mg | ORAL_TABLET | Freq: Two times a day (BID) | ORAL | Status: DC
  Administered 2012-11-01 – 2012-11-08 (×16): 0.25 mg via ORAL
  Filled 2012-10-31 (×6): qty 1
  Filled 2012-10-31: qty 2
  Filled 2012-10-31 (×9): qty 1

## 2012-10-31 MED ORDER — CLONAZEPAM 0.5 MG PO TABS
0.5000 mg | ORAL_TABLET | Freq: Every day | ORAL | Status: DC
  Administered 2012-10-31 – 2012-11-08 (×9): 0.5 mg via ORAL
  Filled 2012-10-31 (×9): qty 1

## 2012-10-31 NOTE — Progress Notes (Signed)
Occupational Therapy Session Note  Patient Details  Name: Tiffany [name] MRN: [id number] Date of Birth: Aug 13, 1950  Today's Date: 10/31/2012 Time: 0830-0930 Time Calculation (min): 60 min  Short Term Goals: Week 2:  OT Short Term Goal 1 (Week 2): Pt will complete bathing at supervision level OT Short Term Goal 2 (Week 2): Pt will complete UB dressing with supervision/cues OT Short Term Goal 3 (Week 2): Pt will complete LB dressing with min assist including shoes OT Short Term Goal 4 (Week 2): Pt will complete toilet transfer wtith supervision  Skilled Therapeutic Interventions/Progress Updates:    Pt seen for ADL retraining with focus on sit <> stand, hemi-technique with dressing tasks, and NM re-ed with focus on RUE functional use.  Pt continues to require verbal and demonstration cues with UB dressing, with pt reporting this is her most difficult thing.  Pt with carryover of donning socks and shoes with use of shoe button without requiring any cues.  Use of RUE as gross assist with opening containers with cues to integrate into task.  Engaged in isolated RUE activities to increase functional grasp and release as well as wrist flexion/extension.  Therapy Documentation Precautions:  Precautions Precautions: Fall Precaution Comments: right hemipareisis, motor apraxia Restrictions Weight Bearing Restrictions: No (apraxia) Pain:  Pt with no c/o pain this session.  See FIM for current functional status  Therapy/Group: Individual Therapy  Leonette Monarch 10/31/2012, 10:32 AM

## 2012-10-31 NOTE — Progress Notes (Signed)
Patient ID: Tiffany [name], female   DOB: 1951/03/09, 62 y.o.   MRN: [id number] Subjective/Complaints: 62 y.o. RH- female with history of CAD, HTN, TIA event a month ago; who developed difficulty speaking with right sided weakness on am of 10/16/12. CCT with bilateral subcortical hypodensities and patient treated with TPA. Carotid dopplers with <80% right ICA stenosis. 2 D echo with EF 60-65% and grade one dysfunction. Around midnight on 04/29 patient with progression of symptoms with dense right hemiparesis with inability to follow commands. Follow up CCT with developing focus of low attenuation left insular cortex and deep left frontal white matter, no hemorrhage. MRI brain done revealing L-MCA territory infarct affecting insula, operculum, left motor strip, superior parietal lobe, and left splenium. MRA brain with LMCA occlusion in M1 segment with no distal flow signal. 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 rate 19, height 5\' 7"  (1.702 m), weight 82 kg (180 lb 12.4 oz), SpO2 92.00%. No results found. Results for orders placed during the hospital encounter of 10/20/12 (from the past 72 hour(s))  BASIC METABOLIC PANEL     Status: Abnormal   Collection Time    10/30/12 10:08 AM      Result Value Range   Sodium 138  135 - 145 mEq/L   Potassium 4.0  3.5 - 5.1 mEq/L   Chloride 101  96 - 112 mEq/L   CO2 26  19 - 32 mEq/L   Glucose, Bld 139 (*) 70 - 99 mg/dL   BUN 14  6 - 23 mg/dL   Creatinine, Ser 7.82  0.50 - 1.10 mg/dL   Calcium 9.9  8.4 - 95.6 mg/dL   GFR calc non Af Amer >90  >90 mL/min   GFR calc Af Amer >90  >90 mL/min   Comment:            The eGFR has been calculated     using the CKD EPI equation.     This calculation has not been     validated in all clinical      situations.     eGFR's persistently     <90 mL/min signify     possible Chronic Kidney Disease.     HEENT: normal Cardio: RRR and no murmur Resp: CTA B/L and unlabored GI: BS positive and non tender Extremity:  Pulses positive and No Edema Skin:   Intact Neuro: Flat, Cranial Nerve Abnormalities R central 7, Abnormal Sensory difficult to assess secondary to speech, Abnormal Motor 2-/5 in RUE, 3-/5 RLE, 5/5 on Left side, Abnormal FMC Ataxic/ dec FMC and Aphasic Musc/Skel:  Normal Gen NAD.  Names 4/4 simple objects,also names stethoscope Assessment/Plan: 1. 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 Physiatrist is providing close team supervision and 24 hour management of active medical problems listed below. Physiatrist and rehab team continue to assess barriers to discharge/monitor patient progress toward functional and medical goals. FIM: FIM - Bathing Bathing Steps Patient Completed: Chest;Right Arm;Left Arm;Abdomen;Front perineal area;Buttocks;Right upper leg;Left upper leg;Right lower leg (including foot);Left lower leg (including foot) Bathing: 4: Steadying assist  FIM - Upper Body Dressing/Undressing Upper body dressing/undressing steps patient completed: Thread/unthread right bra strap;Thread/unthread left bra strap;Thread/unthread right sleeve of pullover  shirt/dresss;Thread/unthread left sleeve of pullover shirt/dress;Put head through opening of pull over shirt/dress;Pull shirt over trunk Upper body dressing/undressing: 4: Min-Patient completed 75 plus % of tasks FIM - Lower Body Dressing/Undressing Lower body dressing/undressing steps patient completed: Thread/unthread right underwear leg;Thread/unthread left underwear leg;Thread/unthread right pants leg;Thread/unthread left pants leg;Pull pants up/down;Don/Doff right sock;Don/Doff left sock;Don/Doff right  shoe;Don/Doff left shoe;Fasten/unfasten right shoe;Fasten/unfasten left shoe Lower body dressing/undressing: 4: Min-Patient completed 75 plus % of tasks  FIM - Toileting Toileting steps completed by patient: Adjust clothing prior to toileting;Performs perineal hygiene Toileting Assistive Devices: Grab bar or rail for support Toileting: 3: Mod-Patient completed 2 of 3 steps  FIM - Diplomatic Services operational officer Devices: Grab bars Toilet Transfers: 4-To toilet/BSC: Min A (steadying Pt. > 75%);4-From toilet/BSC: Min A (steadying Pt. > 75%)  FIM - Bed/Chair Transfer Bed/Chair Transfer Assistive Devices: Arm rests Bed/Chair Transfer: 3: Bed > Chair or W/C: Mod A (lift or lower assist);3: Chair or W/C > Bed: Mod A (lift or lower assist)  FIM - Locomotion: Wheelchair Locomotion: Wheelchair: 1: Total Assistance/staff pushes wheelchair (Pt<25%) FIM - Locomotion: Ambulation Locomotion: Ambulation Assistive Devices: Other (comment);Orthosis (HHA) Ambulation/Gait Assistance: 3: Mod assist Locomotion: Ambulation: 1: Travels less than 50 ft with moderate assistance (Pt: 50 - 74%)  Comprehension Comprehension Mode: Auditory Comprehension: 5-Understands complex 90% of the time/Cues < 10% of the time  Expression Expression Mode: Verbal Expression: 5-Expresses basic 90% of the time/requires cueing < 10% of the time.  Social Interaction Social Interaction: 5-Interacts appropriately 90% of the time - Needs monitoring or encouragement for participation or interaction.  Problem Solving Problem Solving: 5-Solves basic 90% of the time/requires cueing < 10% of the time  Memory Memory: 5-Recognizes or recalls 90% of the time/requires cueing < 10% of the time  Medical Problem List and Plan:  1. DVT Prophylaxis/Anticoagulation: Pharmaceutical: Lovenox  2. Pain Management: Will resume norco prn for headaches.  3. Depression: . Will resume Effexor and klonopin as at home. Team to continue  to provide Ego support and encouragement. Will have LCSW and psychologist follow for support.  4. Neuropsych: This patient is capable of making decisions on her own behalf.  5. HTN: will monitor with bid checks. Continue labetalol. Titrate medications as needed for better control.  6. Dyslipidemia: Continue Lipitor.  7. CAD: Will continue lipitor and ASA. Monitor for symptoms with activity.  8. Dysphagia: Needs full supervision at meals. Will continue Modified diet per ST recommendations   LOS (Days) 11 A FACE TO FACE EVALUATION WAS PERFORMED  KIRSTEINS,ANDREW E 10/31/2012, 9:02 AM

## 2012-10-31 NOTE — Plan of Care (Signed)
Problem: RH BOWEL ELIMINATION Goal: RH STG MANAGE BOWEL WITH ASSISTANCE STG Manage Bowel with min Assistance.  Outcome: Progressing LBM 5/13

## 2012-10-31 NOTE — Progress Notes (Signed)
Recreational Therapy Session Note  Patient Details  Name: Tiffany [name] MRN: [id number] Date of Birth: [birth year] Today's Date: 10/31/2012  Pt participated in animal assisted activity/therapy seated EOB with supervision. Dericka Ostenson 10/31/2012, 3:09 PM

## 2012-10-31 NOTE — Progress Notes (Signed)
Recreational Therapy Session Note  Patient Details  Name: Tiffany [name] MRN: [id number] Date of Birth: 07/05/1950 Today's Date: 10/31/2012 Time:  1005-1035 Pain: no c/o Skilled Therapeutic Interventions/Progress Updates: Session focused on dynamic standing balance for modified soccer activity.  Pt stood with LUE support on rail to kick a ball using RLE and then progressing to standing on RLE working on R knee control while kicking with LLE with min assist.  Pt progressed to ambulating while holding to hallway railing to kick a ball with min assist.  Discussion with pt during rest breaks about remaining active after discharge home to maintain the strength she's gained, pt stated understanding.   Therapy/Group: Co-Treatment  Kellianne Ek 10/31/2012, 11:34 AM

## 2012-10-31 NOTE — Progress Notes (Signed)
Speech Language Pathology Daily Session Note  Patient Details  Name: Tiffany [name] MRN: [id number] Date of Birth: [birth year]  Today's Date: 10/31/2012 Time: 1130-1145 Time Calculation (min): 15 min  Short Term Goals: Week 2: SLP Short Term Goal 1 (Week 2): Pt will consume Regular textures and demonstrate efficient mastication without overt s/s of aspiration with supervision level verbal cues. SLP Short Term Goal 2 (Week 2): Pt will follow 2 step commands with Supervision verbal and question cues with 80% accuracy.  SLP Short Term Goal 3 (Week 2): Pt will generate grammatically correct sentences with Min assist verbal cues.  SLP Short Term Goal 4 (Week 2): Pt will perform oral-motor exercises to increase lingual and labial ROM and strength with Modified Independence verbal cues.  SLP Short Term Goal 5 (Week 2): Pt will attend to right field of enviornment during functional tasks with supervision verbal cues.   Skilled Therapeutic Interventions: Group, co-treatment session with OT to focus on safety with swallowing and self-feeding. SLP facilitated session with Dys.3 textures and thin liquids via cup; Set-up assist for tray; Pt was able to self-monitor and compensate for right-sided anterior loss with Mod I. Patient demonstrated no overt s/s of aspiration throughout meal. Pt also demonstrated appropriate verbal expression and social interaction with other group members with extra time. Continue with trials of regular textures.   FIM:  FIM - Eating Eating Activity: 5: Set-up assist for open containers  Pain Pain Assessment Pain Assessment: No/denies pain  Therapy/Group: Group Therapy  Charlane Ferretti., CCC-SLP [phone]  Tiffany [name] 10/31/2012, 2:13 PM

## 2012-10-31 NOTE — Progress Notes (Signed)
Occupational Therapy Note  Patient Details  Name: Tiffany [name] MRN: [id number] Date of Birth: 05/24/1951 Today's Date: 10/31/2012  Time: 1145-1200 Pt denies pain Group Therapy  Pt participated in self feeding group with focus on compensatory techniques and using RUE as stabilizer.  Pt required HOH assist to use RUE as stabilizer although pt initiates use..  Pt uses LUE effectively with all self feeding tasks.   Lavone Neri [facility] 10/31/2012, 3:05 PM

## 2012-10-31 NOTE — Progress Notes (Signed)
Speech Language Pathology Daily Session Note  Patient Details  Name: Tiffany [name] MRN: [id number] Date of Birth: 02/02/1951  Today's Date: 10/31/2012 Time: 1515-1600 Time Calculation (min): 45 min  Short Term Goals: Week 2: SLP Short Term Goal 1 (Week 2): Pt will consume Regular textures and demonstrate efficient mastication without overt s/s of aspiration with supervision level verbal cues. SLP Short Term Goal 2 (Week 2): Pt will follow 2 step commands with Supervision verbal and question cues with 80% accuracy.  SLP Short Term Goal 3 (Week 2): Pt will generate grammatically correct sentences with Min assist verbal cues.  SLP Short Term Goal 4 (Week 2): Pt will perform oral-motor exercises to increase lingual and labial ROM and strength with Modified Independence verbal cues.  SLP Short Term Goal 5 (Week 2): Pt will attend to right field of enviornment during functional tasks with supervision verbal cues.   Skilled Therapeutic Interventions: Skilled treatment session focused on addressing dysphagia and language goals.  SLP facilitated session with trials of regular textures and a barrier task; patient required increased wait time for mastication of regular textures and mod faded to min assist cues to utilize description as needed.  Patient demonstrated increased fluency within conversational level verbal expression.     FIM:  Comprehension Comprehension Mode: Auditory Comprehension: 5-Understands complex 90% of the time/Cues < 10% of the time Expression Expression Mode: Verbal Expression: 5-Expresses basic 90% of the time/requires cueing < 10% of the time. Social Interaction Social Interaction: 5-Interacts appropriately 90% of the time - Needs monitoring or encouragement for participation or interaction. Problem Solving Problem Solving: 5-Solves basic problems: With no assist Memory Memory: 6-Assistive device: No helper FIM - Eating Eating Activity: 5: Set-up assist for open  containers  Pain Pain Assessment Pain Assessment: No/denies pain  Therapy/Group: Individual Therapy  Charlane Ferretti., CCC-SLP [phone]  [name] 10/31/2012, 4:20 PM

## 2012-10-31 NOTE — Progress Notes (Signed)
Physical Therapy Session Note  Patient Details  Name: Tiffany [name] MRN: [id number] Date of Birth: 1950/11/07  Today's Date: 10/31/2012 Time: 1002-1100 Time Calculation (min): 58 min  Short Term Goals: Week 2:  PT Short Term Goal 1 (Week 2): Patient will perform bed mobility from flat bed, no rails to L and R consistently with min A PT Short Term Goal 2 (Week 2): Patient will perform bed <> w/c transfers to L and R min A PT Short Term Goal 3 (Week 2): Patient will perform w/c mobility x 150' in controlled environment with hemi technique or bilat foot propulsion with min A PT Short Term Goal 4 (Week 2): Patient will perform gait in controlled environment x 100' with orthosis and LRAD and min A overall PT Short Term Goal 5 (Week 2): Patient will ascend and descend 8 steps with one rail and mod A overall   Skilled Therapeutic Interventions/Progress Updates:   Beginning of session co-treat with Recreation therapy.  Performed pre-gait training in standing with LUE support on wall rail with focus on attention to and initiation of RLE swing during ball kicks and then focus on RLE stance phase activation during LLE ball kicks.  Performed gait training with LUE support on wall rail x 25' x 4 reps with min A with RLE performing soccer ball kicks during gait for visual cue for hip and knee flexion > extension during swing for full foot clearance and step length.  Also performed retro stepping and lateral stepping with wall rail with focus on activation and sequencing of RLE for full foot clearance and step length with min A for weight shifting and verbal cues for sequencing and initiation.  Carry over of technique during stair negotiation up and down 5 stairs x 2 reps with LUE support on rail with min A and verbal cues for full RLE hip and knee flexion to assist with foot clearance and advancement to next step; verbal cues also needed to recall safe sequence.  Returned to w/c and continued with w/c mobility  training with L hemi technique x 50' in controlled environment with min-mod A overall to assist with steering with foot.    Therapy Documentation Precautions:  Precautions Precautions: Fall Precaution Comments: right hemipareisis, motor apraxia Restrictions Weight Bearing Restrictions: No Pain: Pain Assessment Pain Assessment: No/denies pain Pain Score: 0-No pain Locomotion : Ambulation Ambulation/Gait Assistance: 4: Min assist Wheelchair Mobility Distance: 50   See FIM for current functional status  Therapy/Group: Individual Therapy and Co-Treatment with recreation therapy  Edman Circle [facility] 10/31/2012, 12:14 PM

## 2012-11-01 ENCOUNTER — Inpatient Hospital Stay (HOSPITAL_COMMUNITY): Payer: BC Managed Care – PPO | Admitting: Speech Pathology

## 2012-11-01 ENCOUNTER — Inpatient Hospital Stay (HOSPITAL_COMMUNITY): Payer: BC Managed Care – PPO | Admitting: *Deleted

## 2012-11-01 ENCOUNTER — Inpatient Hospital Stay (HOSPITAL_COMMUNITY): Payer: BC Managed Care – PPO | Admitting: Physical Therapy

## 2012-11-01 ENCOUNTER — Inpatient Hospital Stay (HOSPITAL_COMMUNITY): Payer: BC Managed Care – PPO | Admitting: Occupational Therapy

## 2012-11-01 MED ORDER — BACLOFEN 10 MG PO TABS
10.0000 mg | ORAL_TABLET | Freq: Two times a day (BID) | ORAL | Status: DC | PRN
  Filled 2012-11-01: qty 1

## 2012-11-01 NOTE — Progress Notes (Signed)
Patient ID: Tiffany [name], female   DOB: Mar 31, 1951, 62 y.o.   MRN: [id number] Subjective/Complaints: 63 y.o. RH- female with history of CAD, HTN, TIA event a month ago; who developed difficulty speaking with right sided weakness on am of 10/16/12. CCT with bilateral subcortical hypodensities and patient treated with TPA. Carotid dopplers with <80% right ICA stenosis. 2 D echo with EF 60-65% and grade one dysfunction. Around midnight on 04/29 patient with progression of symptoms with dense right hemiparesis with inability to follow commands. Follow up CCT with developing focus of low attenuation left insular cortex and deep left frontal white matter, no hemorrhage. MRI brain done revealing L-MCA territory infarct affecting insula, operculum, left motor strip, superior parietal lobe, and left splenium. MRA brain with LMCA occlusion in M1 segment with no distal flow signal. 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 rate 18, height 5\' 7"  (1.702 m), weight 75.569 kg (166 lb 9.6 oz), SpO2 94.00%. No results found. Results for orders placed during the hospital encounter of 10/20/12 (from the past 72 hour(s))  BASIC METABOLIC PANEL     Status: Abnormal   Collection Time    10/30/12 10:08 AM      Result Value Range   Sodium 138  135 - 145 mEq/L   Potassium 4.0  3.5 - 5.1 mEq/L   Chloride 101  96 - 112 mEq/L   CO2 26  19 - 32 mEq/L   Glucose, Bld 139 (*) 70 - 99 mg/dL   BUN 14  6 - 23 mg/dL   Creatinine, Ser 7.82  0.50 - 1.10 mg/dL   Calcium 9.9  8.4 - 95.6 mg/dL   GFR calc non Af Amer >90  >90 mL/min   GFR calc Af Amer >90  >90 mL/min   Comment:            The eGFR has been calculated     using the CKD EPI equation.     This calculation has not been     validated in all  clinical     situations.     eGFR's persistently     <90 mL/min signify     possible Chronic Kidney Disease.     HEENT: normal Cardio: RRR and no murmur Resp: CTA B/L and unlabored GI: BS positive and non tender Extremity:  Pulses positive and No Edema Skin:   Intact Neuro: Flat, Cranial Nerve Abnormalities R central 7, Abnormal Sensory difficult to assess secondary to speech, Abnormal Motor 2-/5 in RUE, 3-/5 RLE, 5/5 on Left side, Abnormal FMC Ataxic/ dec FMC and Aphasic Musc/Skel:  Normal Gen NAD.  Names 4/4 simple objects,also names stethoscope Assessment/Plan: 1. 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 Physiatrist is providing close team supervision and 24 hour management of active medical problems listed below. Physiatrist and rehab team continue to assess barriers to discharge/monitor patient progress toward functional and medical goals. FIM: FIM - Bathing Bathing Steps Patient Completed: Chest;Right Arm;Left Arm;Abdomen;Front perineal area;Buttocks;Right upper leg;Left upper leg;Right lower leg (including foot);Left lower leg (including foot) Bathing: 4: Steadying assist  FIM - Upper Body Dressing/Undressing Upper body dressing/undressing steps patient completed: Thread/unthread right sleeve of pullover shirt/dresss;Thread/unthread left sleeve of pullover shirt/dress;Put  head through opening of pull over shirt/dress;Pull shirt over trunk Upper body dressing/undressing: 5: Supervision: Safety issues/verbal cues FIM - Lower Body Dressing/Undressing Lower body dressing/undressing steps patient completed: Don/Doff right sock;Don/Doff left sock;Don/Doff right shoe;Don/Doff left shoe;Fasten/unfasten right shoe;Fasten/unfasten left shoe Lower body dressing/undressing: 5: Set-up assist to: Don/Doff AFO/prosthesis/orthosis  FIM - Toileting Toileting steps completed by  patient: Adjust clothing prior to toileting;Performs perineal hygiene Toileting Assistive Devices: Grab bar or rail for support Toileting: 3: Mod-Patient completed 2 of 3 steps  FIM - Diplomatic Services operational officer Devices: Grab bars Toilet Transfers: 4-To toilet/BSC: Min A (steadying Pt. > 75%);4-From toilet/BSC: Min A (steadying Pt. > 75%)  FIM - Bed/Chair Transfer Bed/Chair Transfer Assistive Devices: Arm rests Bed/Chair Transfer: 3: Bed > Chair or W/C: Mod A (lift or lower assist);3: Chair or W/C > Bed: Mod A (lift or lower assist)  FIM - Locomotion: Wheelchair Distance: 50 Locomotion: Wheelchair: 2: Travels 50 - 149 ft with moderate assistance (Pt: 50 - 74%) FIM - Locomotion: Ambulation Locomotion: Ambulation Assistive Devices: Other (comment);Orthosis (wall rail) Ambulation/Gait Assistance: 4: Min assist Locomotion: Ambulation: 2: Travels 50 - 149 ft with minimal assistance (Pt.>75%)  Comprehension Comprehension Mode: Auditory Comprehension: 5-Understands complex 90% of the time/Cues < 10% of the time  Expression Expression Mode: Verbal Expression: 5-Expresses basic 90% of the time/requires cueing < 10% of the time.  Social Interaction Social Interaction: 5-Interacts appropriately 90% of the time - Needs monitoring or encouragement for participation or interaction.  Problem Solving Problem Solving: 5-Solves basic problems: With no assist  Memory Memory: 6-Assistive device: No helper  Medical Problem List and Plan:  1. DVT Prophylaxis/Anticoagulation: Pharmaceutical: Lovenox  2. Pain Management: Will resume norco prn for headaches.  3. Depression: . Will resume Effexor and klonopin as at home. Team to continue to provide Ego support and encouragement. Will have LCSW and psychologist follow for support.  4. Neuropsych: This patient is capable of making decisions on her own behalf.  5. HTN: will monitor with bid checks. Continue labetalol. Titrate medications  as needed for better control.  6. Dyslipidemia: Continue Lipitor.  7. CAD: Will continue lipitor and ASA. Monitor for symptoms with activity.  8. Dysphagia: Needs full supervision at meals. Will continue Modified diet per ST recommendations   LOS (Days) 12 A FACE TO FACE EVALUATION WAS PERFORMED  KIRSTEINS,ANDREW E 11/01/2012, 10:50 AM

## 2012-11-01 NOTE — Progress Notes (Signed)
Speech Language Pathology Daily Session Note  Patient Details  Name: Tiffany [name] MRN: [id number] Date of Birth: 1951/06/02  Today's Date: 11/01/2012 Time: 1130-1210 Time Calculation (min): 40 min  Short Term Goals: Week 2: SLP Short Term Goal 1 (Week 2): Pt will consume Regular textures and demonstrate efficient mastication without overt s/s of aspiration with supervision level verbal cues. SLP Short Term Goal 2 (Week 2): Pt will follow 2 step commands with Supervision verbal and question cues with 80% accuracy.  SLP Short Term Goal 3 (Week 2): Pt will generate grammatically correct sentences with Min assist verbal cues.  SLP Short Term Goal 4 (Week 2): Pt will perform oral-motor exercises to increase lingual and labial ROM and strength with Modified Independence verbal cues.  SLP Short Term Goal 5 (Week 2): Pt will attend to right field of enviornment during functional tasks with supervision verbal cues.   Skilled Therapeutic Interventions: Skilled treatment session focused on addressing safety with swallowing and self-feeding. SLP facilitated session with Regular textures and thin liquids via cup; Set-up assist for tray; Patient was able to self-monitor and compensate for right-sided anterior loss with Mod I. Patient demonstrated no overt s/s of aspiration throughout meal.    FIM:  Comprehension Comprehension Mode: Auditory Comprehension: 5-Understands complex 90% of the time/Cues < 10% of the time Expression Expression Mode: Verbal Expression: 5-Expresses basic 90% of the time/requires cueing < 10% of the time. Social Interaction Social Interaction: 5-Interacts appropriately 90% of the time - Needs monitoring or encouragement for participation or interaction. Problem Solving Problem Solving: 5-Solves basic problems: With no assist Memory Memory: 6-Assistive device: No helper FIM - Eating Eating Activity: 5: Set-up assist for open containers  Pain Pain Assessment Pain  Assessment: No/denies pain  Therapy/Group: Individual Therapy  Charlane Ferretti., CCC-SLP [phone]  Parish Dubose 11/01/2012, 12:18 PM

## 2012-11-01 NOTE — Progress Notes (Signed)
Occupational Therapy Session Note  Patient Details  Name: Tiffany [name] MRN: [id number] Date of Birth: 11-04-1950  Today's Date: 11/01/2012 Time: 0930-1028 Time Calculation (min): 58 min  Short Term Goals: Week 2:  OT Short Term Goal 1 (Week 2): Pt will complete bathing at supervision level OT Short Term Goal 2 (Week 2): Pt will complete UB dressing with supervision/cues OT Short Term Goal 3 (Week 2): Pt will complete LB dressing with min assist including shoes OT Short Term Goal 4 (Week 2): Pt will complete toilet transfer wtith supervision  Skilled Therapeutic Interventions/Progress Updates:    Pt seen for ADL retraining with focus on sit <> stand, carryover of hemi-technique with dressing tasks, and NM re-ed with focus on RUE use in functional tasks.  Engaged in grooming and UB dressing at sink.  Pt with cues for sequencing with UB dressing secondary to apraxia with pt reporting "am I ever going to get this?".  Engaged in grooming in standing with min assist for sit to stand and close supervision in standing with oral hygiene.  Hand over hand RUE with support at elbow against gravity when reaching to turn off water.  Pt donned socks and shoes without assistance.  Engaged in table top activity with focus on reaching as well as shoulder flexion, elbow flexion/extension, and wrist flexion to obtain items and move across horizontal surface.  Pt with increased shoulder flexion and ability to obtain items this session, provided support at elbow to attempt to eliminate effects of gravity.  Therapy Documentation Precautions:  Precautions Precautions: Fall Precaution Comments: right hemipareisis, motor apraxia Restrictions Weight Bearing Restrictions: No Pain:  Pt with no c/o pain this session.  See FIM for current functional status  Therapy/Group: Individual Therapy  Leonette Monarch 11/01/2012, 10:54 AM

## 2012-11-01 NOTE — Progress Notes (Signed)
Recreational Therapy Session Note  Patient Details  Name: Tiffany [name] MRN: [id number] Date of Birth: 1950-09-06 Today's Date: 11/01/2012 Time:  1305-1340 Pain: no c/o Skilled Therapeutic Interventions/Progress Updates: session focused on handwriting per pt's request.  Pt utilized LUE for writing using RUE to stabilize paper with supervision.  Pt with difficulty formulating appropriate letters to spell her name.  Pt self corrected errors ~25 % of the time.  Pt able to copy name with supervision, min cues to correct errors. Per pt request, left paper in room with her name, her husband's & her date of birth to practice writing/copying on her own. Therapy/Group: Individual Therapy  Jonathandavid Marlett 11/01/2012, 4:37 PM

## 2012-11-01 NOTE — Progress Notes (Signed)
Speech Language Pathology Daily Session Note  Patient Details  Name: Tiffany [name] MRN: [id number] Date of Birth: 10/02/1950  Today's Date: 11/01/2012 Time: 4098-1191 Time Calculation (min): 30 min  Short Term Goals: Week 2: SLP Short Term Goal 1 (Week 2): Pt will consume Regular textures and demonstrate efficient mastication without overt s/s of aspiration with supervision level verbal cues. SLP Short Term Goal 2 (Week 2): Pt will follow 2 step commands with Supervision verbal and question cues with 80% accuracy.  SLP Short Term Goal 3 (Week 2): Pt will generate grammatically correct sentences with Min assist verbal cues.  SLP Short Term Goal 4 (Week 2): Pt will perform oral-motor exercises to increase lingual and labial ROM and strength with Modified Independence verbal cues.  SLP Short Term Goal 5 (Week 2): Pt will attend to right field of enviornment during functional tasks with supervision verbal cues.   Skilled Therapeutic Interventions: Skilled treatment session focused on addressing cognitive-linguistic goals.  SLP facilitated session with Min faded to Supervision level verbal cues to utilize working memory and word retrieval strategies during a structured new learning task.    FIM:  Comprehension Comprehension Mode: Auditory Comprehension: 5-Understands complex 90% of the time/Cues < 10% of the time Expression Expression Mode: Verbal Expression: 5-Expresses basic 90% of the time/requires cueing < 10% of the time. Social Interaction Social Interaction: 5-Interacts appropriately 90% of the time - Needs monitoring or encouragement for participation or interaction. Problem Solving Problem Solving: 5-Solves basic problems: With no assist Memory Memory: 6-Assistive device: No helper FIM - Eating Eating Activity: 5: Set-up assist for open containers  Pain Pain Assessment Pain Assessment: No/denies pain  Therapy/Group: Individual Therapy  Tiffany [name].,  CCC-SLP [phone]  Tiffany [name] 11/01/2012, 4:22 PM

## 2012-11-01 NOTE — Progress Notes (Signed)
Physical Therapy Session Note  Patient Details  Name: Tiffany [name] MRN: [id number] Date of Birth: [birth year]  Today's Date: 11/01/2012 Time: 1400-1500 Time Calculation (min): 60 min  Short Term Goals: Week 1:  PT Short Term Goal 1 (Week 1): Patient will be able to perform bed mobility with min-A PT Short Term Goal 1 - Progress (Week 1): Met PT Short Term Goal 2 (Week 1): Patient will be able to perform transfers with min-A PT Short Term Goal 2 - Progress (Week 1): Progressing toward goal PT Short Term Goal 3 (Week 1): Patient will be able to ambulate 20 feet using LRAD with min-A PT Short Term Goal 3 - Progress (Week 1): Progressing toward goal PT Short Term Goal 4 (Week 1): Patient will be able to ascend/descend 4 steps using hand rail with Mod-A PT Short Term Goal 4 - Progress (Week 1): Met  Skilled Therapeutic Interventions/Progress Updates:   Patient performed w/c mobility x 150' in controlled environment with bilat foot propulsion to focus on RLE activation, coordination and sequencing of quad and hamstring activation; required mod-max A overall to attend to R foot and initiation of extension.  Performed transfers w/c <> Nustep with stand pivot and mod-max A for lifting assistance, assistance to fully pivot to seat and verbal cues to attend to R foot, lateral weight shifting to assist with RLE advancement and lowering assistance.  Performed bilat UE and LE strengthening, coordination and endurance on Nustep at level 5 x 10 minutes with bilat UE and LE with ace wrap assisting R grip + 5 minutes with LE only for extensor activation training.  Performed gait training with quad cane in LUE x 30' in controlled environment with max A overall with verbal and visual cues for sequencing of cane and stepping pattern; also required physical assistance for lateral weight shifting and cues to initiate stepping with RLE and verbal cues for full extension through RLE in stance.  Performed stair  negotiation up and down 3 short steps with LUE support on rail ascending with RLE and descending with RLE to focus on L lateral weight shifting, R foot clearance during swing phase and RLE extensor activation in stance.  Patient very fatigued at end of session and presented with increased difficulty with attending to R side and initiation on R side with fatigue.    Therapy Documentation Precautions:  Precautions Precautions: Fall Precaution Comments: right hemipareisis, motor apraxia Restrictions Weight Bearing Restrictions: No Vital Signs: Therapy Vitals Temp: 97.9 F (36.6 C) Temp src: Oral Pulse Rate: 111 Resp: 17 BP: 123/77 mmHg Patient Position, if appropriate: Sitting Pain: Pain Assessment Pain Assessment: No/denies pain Locomotion : Ambulation Ambulation/Gait Assistance: 2: Max Lawyer Distance: 150   See FIM for current functional status  Therapy/Group: Individual Therapy  Edman Circle [facility] 11/01/2012, 3:45 PM

## 2012-11-01 NOTE — Progress Notes (Signed)
Occupational Therapy Note  Patient Details  Name: Milayna [name] MRN: [id number] Date of Birth: 1950/12/25 Today's Date: 11/01/2012  Time: 1200-1210 Pt denies pain Individual Therapy  Cotx with Speech Therapy for self feeding and swallowing strategies.  Pt required HOH assist to use RUE as stabilizer with opening containers.  Pt required only setup assist for self feeding.   Lavone Neri [facility] 11/01/2012, 4:06 PM

## 2012-11-02 ENCOUNTER — Inpatient Hospital Stay (HOSPITAL_COMMUNITY): Payer: BC Managed Care – PPO | Admitting: *Deleted

## 2012-11-02 ENCOUNTER — Inpatient Hospital Stay (HOSPITAL_COMMUNITY): Payer: BC Managed Care – PPO | Admitting: Occupational Therapy

## 2012-11-02 ENCOUNTER — Inpatient Hospital Stay (HOSPITAL_COMMUNITY): Payer: BC Managed Care – PPO | Admitting: Speech Pathology

## 2012-11-02 DIAGNOSIS — G8110 Spastic hemiplegia affecting unspecified side: Secondary | ICD-10-CM

## 2012-11-02 DIAGNOSIS — I69920 Aphasia following unspecified cerebrovascular disease: Secondary | ICD-10-CM

## 2012-11-02 DIAGNOSIS — I6330 Cerebral infarction due to thrombosis of unspecified cerebral artery: Secondary | ICD-10-CM

## 2012-11-02 MED ORDER — BACLOFEN 10 MG PO TABS
10.0000 mg | ORAL_TABLET | Freq: Every day | ORAL | Status: DC
  Administered 2012-11-02: 10 mg via ORAL
  Filled 2012-11-02 (×2): qty 1

## 2012-11-02 MED ORDER — METOPROLOL TARTRATE 12.5 MG HALF TABLET
12.5000 mg | ORAL_TABLET | Freq: Two times a day (BID) | ORAL | Status: DC
  Administered 2012-11-02 – 2012-11-05 (×6): 12.5 mg via ORAL
  Filled 2012-11-02 (×9): qty 1

## 2012-11-02 NOTE — Progress Notes (Signed)
Physical Therapy Session Note  Patient Details  Name: Tiffany [name] MRN: [id number] Date of Birth: 08-06-1950  Today's Date: 11/02/2012 Time:  10:02-11:00 ( ) and 13:15-13:30 ( )   Skilled Therapeutic Interventions/Progress Updates:  1:2 Tx focused on NMR for RLE sustained activation, pre-gait training, and gait training with PFRW.  Pt propelled WC x 80' with bil LEs only focusing on reciprocal motion with LEs and proper timing with activation. Task required increased time and cues for sequence. Pt propelled back towards room x150' with Max A for steering, challenged with coordinating.   Performed standing NMR tasks with mirror for visual feed back for the following:  - sit<>stand with LLE up on 4" step for increased R activation. Static stance with LLE on step 2x60' with up to Mod A for balance due to R LOB. This task very difficult for pt.  - Static stance x61min without UE assist, focusing on self-correction of LOB and re-activation of R quad as LLE drifts into flexion.  - Lateral weights with up to Mod A to accomplish, difficulty with L shift>R.  - Step taps to 2" step with R/L 2x10 each side, up to Max A and difficulty weight shifting to L for R step. Pt needing cues for lifting to place/remove RLE. Pt has difficulty regaining balance once drifts off to R side.   Gait training 2x120' with PFRW and up to mod A for steadying. Pt needing step-by-step cues for safe sequence of RW management and completing R step prior to L step. Difficulty with coordinating task.      2:2  Tx focused on static standing during functional task, maintaining R LE ext activation. Pt needing tactile stimulation and cues to regain knee ext as it drifts into flexion. Pt had no LOB during 10 min standing. And was able to perform transfers with Min A to tall table.   Therapy Documentation Precautions:  Precautions Precautions: Fall Precaution Comments: right hemipareisis, motor  apraxia Restrictions Weight Bearing Restrictions: No   Pain: None    Locomotion : Wheelchair Mobility Distance: 150   See FIM for current functional status  Therapy/Group: Individual Therapy  Clydene Laming, PT, DPT  11/02/2012, 10:33 AM

## 2012-11-02 NOTE — Progress Notes (Signed)
Patient ID: Tiffany [name], female   DOB: [birth year], 62 y.o.   MRN: [id number] Subjective/Complaints: 62 y.o. RH- female with history of CAD, HTN, TIA event a month ago; who developed difficulty speaking with right sided weakness on am of 10/16/12. CCT with bilateral subcortical hypodensities and patient treated with TPA. Carotid dopplers with <80% right ICA stenosis. 2 D echo with EF 60-65% and grade one dysfunction. Around midnight on 04/29 patient with progression of symptoms with dense right hemiparesis with inability to follow commands. Follow up CCT with developing focus of low attenuation left insular cortex and deep left frontal white matter, no hemorrhage. MRI brain done revealing L-MCA territory infarct affecting insula, operculum, left motor strip, superior parietal lobe, and left splenium. MRA brain with LMCA occlusion in M1 segment with no distal flow signal. 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 rate 18, height 5\' 7"  (1.702 m), weight 75.569 kg (166 lb 9.6 oz), SpO2 92.00%. No results found. No results found for this or any previous visit (from the past 72 hour(s)).   HEENT: normal Cardio: RRR and no murmur Resp: CTA B/L and unlabored GI: BS positive and non tender Extremity:  Pulses positive and No Edema Skin:   Intact Neuro: Flat, Cranial Nerve Abnormalities R central 7, Abnormal Sensory difficult to assess secondary to speech, Abnormal Motor 2-/5 in RUE, 3-/5 RLE, 5/5 on Left side, Abnormal FMC Ataxic/ dec FMC and Aphasic Musc/Skel:  Normal Gen NAD.  Names 4/4 simple objects,also names stethoscope Assessment/Plan: 1. 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 Physiatrist is providing close team supervision and 24 hour management of active medical problems listed below. Physiatrist and rehab team continue to assess barriers to discharge/monitor patient progress toward functional and medical goals. FIM: FIM - Bathing Bathing Steps Patient Completed: Chest;Right Arm;Left Arm;Abdomen;Front perineal area;Buttocks;Right upper leg;Left upper leg;Right lower leg (including foot);Left lower leg (including foot) Bathing: 5: Set-up assist to: Adjust water temp  FIM - Upper Body Dressing/Undressing Upper body dressing/undressing steps patient completed: Thread/unthread right bra strap;Thread/unthread left bra strap;Thread/unthread right sleeve of pullover shirt/dresss;Thread/unthread left sleeve of pullover shirt/dress;Put head through opening of pull over shirt/dress;Pull shirt over trunk Upper body dressing/undressing: 4: Min-Patient completed 75 plus % of tasks FIM - Lower Body Dressing/Undressing Lower body dressing/undressing steps patient completed: Thread/unthread right underwear leg;Thread/unthread left underwear leg;Thread/unthread right pants leg;Thread/unthread left pants leg;Pull pants up/down;Don/Doff right sock;Don/Doff left sock;Don/Doff right shoe;Don/Doff left shoe;Fasten/unfasten right shoe;Fasten/unfasten left shoe Lower body dressing/undressing: 4: Min-Patient completed 75 plus % of tasks  FIM - Toileting Toileting steps completed by patient: Adjust clothing prior to toileting;Performs perineal hygiene Toileting Assistive Devices: Grab bar or rail for support Toileting: 3: Mod-Patient completed 2 of 3 steps  FIM - Diplomatic Services operational officer Devices: Grab bars;Walker Toilet Transfers: 3-To toilet/BSC: Mod A (lift or lower assist);4-From toilet/BSC: Min A (steadying Pt. > 75%)  FIM - Bed/Chair Transfer Bed/Chair Transfer Assistive Devices: Arm rests;Orthosis Bed/Chair Transfer: 3: Bed > Chair or W/C: Mod A  (lift or lower assist);3: Chair or W/C > Bed: Mod A (lift or lower assist)  FIM - Locomotion: Wheelchair Distance: 150 Locomotion: Wheelchair: 2: Travels 50 - 149 ft with moderate assistance (Pt:  50 - 74%) FIM - Locomotion: Ambulation Locomotion: Ambulation Assistive Devices: Walker - Rolling Ambulation/Gait Assistance: 3: Mod assist Locomotion: Ambulation: 1: Travels less than 50 ft with moderate assistance (Pt: 50 - 74%)  Comprehension Comprehension Mode: Auditory Comprehension: 5-Understands complex 90% of the time/Cues < 10% of the time  Expression Expression Mode: Verbal Expression: 5-Expresses basic needs/ideas: With extra time/assistive device  Social Interaction Social Interaction: 6-Interacts appropriately with others with medication or extra time (anti-anxiety, antidepressant).  Problem Solving Problem Solving: 5-Solves basic problems: With no assist  Memory Memory: 5-Requires cues to use assistive device  Medical Problem List and Plan:  1. DVT Prophylaxis/Anticoagulation: Pharmaceutical: Lovenox  2. Pain Management: Will resume norco prn for headaches.  3. Depression: . Will resume Effexor and klonopin as at home. Team to continue to provide Ego support and encouragement. Will have LCSW and psychologist follow for support.  4. Neuropsych: This patient is capable of making decisions on her own behalf.  5. HTN: will monitor with bid checks. Continue labetalol. Titrate medications as needed for better control. Occ BP elevation , mild tachy on metoprolol at home 25mg , will start 12.5mg  and monitor 6. Dyslipidemia: Continue Lipitor.  7. CAD: Will continue lipitor and ASA. Monitor for symptoms with activity.  8. Dysphagia: Needs full supervision at meals. Will continue Modified diet per ST recommendations   LOS (Days) 13 A FACE TO FACE EVALUATION WAS PERFORMED  KIRSTEINS,ANDREW E 11/02/2012, 6:47 PM

## 2012-11-02 NOTE — Progress Notes (Signed)
Social Work Patient ID: Tiffany [name], female   DOB: 1951/01/06, 62 y.o.   MRN: [id number]  Met with patient this mornign to review team conference.  Discussed targeted d/c date still for 5/23.  Pt requesting that it be changed to [birth year] if possible (her birthday).  Tx team and MD aware and agreeable with change.  Aware that Dossie Der, LCSW will resume coverage of her case through to d/c.  Kendarrius Tanzi, LCSW

## 2012-11-02 NOTE — Progress Notes (Signed)
Speech Language Pathology Daily Session Note & Weekly Progress Note  Patient Details  Name: Tiffany [name] MRN: [id number] Date of Birth: [birth year]  Today's Date: 11/02/2012 Time: 4098-1191 Time Calculation (min): 30 min  Short Term Goals: Week 2: SLP Short Term Goal 1 (Week 2): Pt will consume Regular textures and demonstrate efficient mastication without overt s/s of aspiration with supervision level verbal cues. SLP Short Term Goal 2 (Week 2): Pt will follow 2 step commands with Supervision verbal and question cues with 80% accuracy.  SLP Short Term Goal 3 (Week 2): Pt will generate grammatically correct sentences with Min assist verbal cues.  SLP Short Term Goal 4 (Week 2): Pt will perform oral-motor exercises to increase lingual and labial ROM and strength with Modified Independence verbal cues.  SLP Short Term Goal 5 (Week 2): Pt will attend to right field of enviornment during functional tasks with supervision verbal cues.   Skilled Therapeutic Interventions: Skilled treatment session focused on addressing cognitive-linguistic goals.  SLP facilitated session with Min faded to Supervision level verbal cues to utilize word retrieval strategies during a structured task.  Patient demonstrated increased awarenes and emerging ability to self-correct errors.   FIM:  Comprehension Comprehension Mode: Auditory Comprehension: 5-Understands complex 90% of the time/Cues < 10% of the time Expression Expression Mode: Verbal Expression: 5-Expresses basic needs/ideas: With extra time/assistive device Social Interaction Social Interaction: 6-Interacts appropriately with others with medication or extra time (anti-anxiety, antidepressant). Problem Solving Problem Solving: 5-Solves basic problems: With no assist Memory Memory: 5-Requires cues to use assistive device  Pain Pain Assessment Pain Assessment: No/denies pain Pain Score: 0-No pain  Therapy/Group: Individual Therapy   Speech  Language Pathology Weekly Progress Note  Patient Details  Name: Tiffany [name] MRN: [id number] Date of Birth: 02/24/1951  Today's Date: 11/02/2012  Short Term Goals: Week 2: SLP Short Term Goal 1 (Week 2): Pt will consume Regular textures and demonstrate efficient mastication without overt s/s of aspiration with supervision level verbal cues. SLP Short Term Goal 1 - Progress (Week 2): Met SLP Short Term Goal 2 (Week 2): Pt will follow 2 step commands with Supervision verbal and question cues with 80% accuracy.  SLP Short Term Goal 2 - Progress (Week 2): Met SLP Short Term Goal 3 (Week 2): Pt will generate grammatically correct sentences with Min assist verbal cues.  SLP Short Term Goal 3 - Progress (Week 2): Met SLP Short Term Goal 4 (Week 2): Pt will perform oral-motor exercises to increase lingual and labial ROM and strength with Modified Independence verbal cues.  SLP Short Term Goal 4 - Progress (Week 2): Met SLP Short Term Goal 5 (Week 2): Pt will attend to right field of enviornment during functional tasks with supervision verbal cues.  SLP Short Term Goal 5 - Progress (Week 2): Met Week 3: SLP Short Term Goal 1 (Week 3): Pt will follow multi-step commands with Supervision verbal and question cues with 90% accuracy.  SLP Short Term Goal 2 (Week 3): Pt will utilize word finding strategies in conversatoin with Supervison verbal cues  SLP Short Term Goal 3 (Week 3): Patient will self-monitor and correct naming errors with Supervision level verbal cues  SLP Short Term Goal 4 (Week 3): Patient will generate phrase level written expression with Supervision level verbal cues for correct spelling SLP Short Term Goal 5 (Week 3): Patient will utilize memory strategies with Modified Independence  Weekly Progress Updates: Patient met 5 out of 5 short term goals this reporting period due to  gains in oral motor strength resulting in diet advancement and increased speech intelligibility, with overall  Modified Independence.  Patient also progressed with receptive and expressive language abilities and overall ability to communicate abstract thoughts.  However, patient continues to require min assist for written and verbal communication and would continue to benefit from skilled SLP service to address these deficits as well as to address writing and memory strategies.  As a result this patient continues to require services during the next reporting period.   SLP Intensity: Minumum of 1-2 x/day, 30 to 90 minutes SLP Frequency: 5 out of 7 days SLP Duration/Estimated Length of Stay: 11/09/12 SLP Treatment/Interventions: Cognitive remediation/compensation;Cueing hierarchy;Functional tasks;Patient/family education;Therapeutic Activities;Speech/Language facilitation;Therapeutic Exercise;Oral motor exercises;Neuromuscular electrical stimulation;Internal/external aids;Environmental controls  Charlane Ferretti., CCC-SLP [phone]  Elenna Spratling 11/02/2012, 4:42 PM

## 2012-11-02 NOTE — Progress Notes (Signed)
Recreational Therapy Session Note  Patient Details  Name: Tiffany [name] MRN: [id number] Date of Birth: March 02, 1951 Today's Date: 11/02/2012 Time:  105-145 Pain: no c/o Skilled Therapeutic Interventions/Progress Updates: Pt stood at [employer] table looking at sales paper using RUE with mod assist to turn the pages and min assist for balance to WB through RLE.    Therapy/Group: Individual Therapy   Tiffany [name] 11/02/2012, 3:33 PM

## 2012-11-02 NOTE — Patient Care Conference (Signed)
Inpatient RehabilitationTeam Conference and Plan of Care Update Date: 11/01/2012   Time: 10:50 AM    Patient Name: Tiffany [name]      Medical Record Number: [id number]  Date of Birth: [birth year] Sex: Female         Room/Bed: 4151/4151-01 Payor Info: Payor: BLUE [employer] / Plan: [facility] HE[facility] PPO / Product Type: *No Product type* /    Admitting Diagnosis: CVA  Admit Date/Time:  10/20/2012  6:34 PM Admission Comments: No comment available   Primary Diagnosis:  CVA (cerebral infarction) Principal Problem: CVA (cerebral infarction)  Patient Active Problem List   Diagnosis Date Noted  . CVA (cerebral infarction) 10/23/2012  . Depression 10/23/2012  . Dyslipidemia 10/23/2012    Expected Discharge Date: Expected Discharge Date: 11/10/12  Team Members Present: Physician leading conference: Dr. Claudette Laws Social Worker Present: Amada Jupiter, LCSW Nurse Present: Chana Bode, RN PT Present: Edman Circle, PT;Caroline Adriana Simas, PT;Other (comment) Clarisse Gouge Ripa, PT) OT Present: Bretta Bang, Verlene Mayer, OT;Other (comment) Dorathy Daft Perkinson, OT) SLP Present: Fae Pippin, SLP Other (Discipline and Name): Tora Duck, PPS Coordinator     Current Status/Progress Goal Weekly Team Focus  Medical   apraxia, reduced sensation, no further bowel incontinence  maintained medical stability  discharge planning   Bowel/Bladder   Continent of bowel, incont of bladder at times   continent of blowel and bladder  timed tooileting of bladder q2-3 hours whille awake   Swallow/Nutrition/ Hydration   trials of regular textures and thin liquids with intermittent staff supervision   least restrictive p.o. intake   carryover with use of compensatory strategies    ADL's   supervision UB dressing (min with donning bra), min assist LB dressing, min assist shower and toilet transfers from w/c  supervision overall  RUE NM re-ed, transfers, standing balance and tolerance, family education    Mobility   min-mod A overall  Supervision-min A overall  attention to R, gait, stairs, balance   Communication   min assist  supervision-min assist   increase carryover of compensatory strategies    Safety/Cognition/ Behavioral Observations  min assist   supervision   increase self-monitoring and correcting   Pain   no complaints of pain  Pain level less than 3  pain less than 3   Skin   No skin breakdown  Skin to reamin intact with no new breakdown  maintian skin free from breakdown      *See Care Plan and progress notes for long and short-term goals.  Barriers to Discharge: mobile home    Possible Resolutions to Barriers:  ffamily training    Discharge Planning/Teaching Needs:  Home with husband and step-daughter to provide supervision / assist      Team Discussion:  Pain continues to be a problem.  incont at times due to urgency.  Mood improved and making good progress overall.  Anticipate meeting supervision goals overall  Revisions to Treatment Plan:  None   Continued Need for Acute Rehabilitation Level of Care: The patient requires daily medical management by a physician with specialized training in physical medicine and rehabilitation for the following conditions: Daily direction of a multidisciplinary physical rehabilitation program to ensure safe treatment while eliciting the highest outcome that is of practical value to the patient.: Yes Daily medical management of patient stability for increased activity during participation in an intensive rehabilitation regime.: Yes Daily analysis of laboratory values and/or radiology reports with any subsequent need for medication adjustment of medical intervention for :  Neurological problems  Britny Riel 11/02/2012, 4:41 PM

## 2012-11-02 NOTE — Progress Notes (Signed)
Occupational Therapy Weekly Progress Note and Treatment Session Note  Patient Details  Name: Tiffany [name] MRN: [id number] Date of Birth: 02-Mar-1951  Today's Date: 11/02/2012 Time: 0830-0930 Time Calculation (min): 60 min  Patient has met 3 of 4 short term goals.  Pt demonstrating tremendous progress towards goals.  Pt has progressed to light min assist sit to stand and close supervision with static standing.  Pt continues to require cues to attend to RUE and integrate RUE into functional tasks.  Pt has developed thumb opposition but continues to have decreased ability to grasp items.  Improvements have been shown in UB and LB dressing with improved sequencing and orientation, however continues to require min-mod cues.  Patient continues to demonstrate the following deficits: impaired activity tolerance, Rt hemiplegia with impaired motor control, timing, sequencing, apraxia, impaired attention to R, impaired postural control, dynamic balance and therefore will continue to benefit from skilled OT intervention to enhance overall performance with BADL and Reduce care partner burden.  Patient progressing toward long term goals..  Continue plan of care.  OT Short Term Goals Week 2:  OT Short Term Goal 1 (Week 2): Pt will complete bathing at supervision level OT Short Term Goal 1 - Progress (Week 2): Met OT Short Term Goal 2 (Week 2): Pt will complete UB dressing with supervision/cues OT Short Term Goal 2 - Progress (Week 2): Met OT Short Term Goal 3 (Week 2): Pt will complete LB dressing with min assist including shoes OT Short Term Goal 3 - Progress (Week 2): Met OT Short Term Goal 4 (Week 2): Pt will complete toilet transfer wtith supervision OT Short Term Goal 4 - Progress (Week 2): Progressing toward goal Week 3:  OT Short Term Goal 1 (Week 3): Pt will complete toilet transfer wtith supervision OT Short Term Goal 2 (Week 3): Pt will complete LB dressing with supervision OT Short Term Goal 3  (Week 3): Pt will complete shower transfer with min assist OT Short Term Goal 4 (Week 3): Pt will demonstrate use of RUE with grooming tasks with gross assist and min cues.  Skilled Therapeutic Interventions/Progress Updates:    Pt seen for ADL retraining with focus on transfers, sit <> stand, hemi-technique with dressing tasks, and NM re-ed with focus on RUE functional use.  Pt completed bathing at shower level with close supervision with sit to stand for washing buttocks.  Pt with improved sequencing with UB dressing this session, however continuously looking to this therapist for approval.  Hand over hand with RUE to assist in stabilizing containers while opening with Lt hand.  Toilet transfer with light min/steady assist and pt doffing pants and completing hygiene with supervision.  Therapy Documentation Precautions:  Precautions Precautions: Fall Precaution Comments: right hemipareisis, motor apraxia Restrictions Weight Bearing Restrictions: No Pain: Pain Assessment Pain Assessment: No/denies pain Pt with no c/o pain this session.  See FIM for current functional status  Therapy/Group: Individual Therapy  Leonette Monarch 11/02/2012, 12:07 PM

## 2012-11-02 NOTE — Progress Notes (Signed)
NUTRITION FOLLOW UP  Intervention:    Continue Ensure pudding TID.  Continue Heart Healthy diet.  Nutrition Dx:   Inadequate oral intake related to dislike of therapeutic diet as evidenced by pt report. Resolved with diet upgrade.  No new nutrition diagnosis at this time.  Goal:   Intake to meet >90% of estimated nutrition needs. Met.  Monitor:   PO intake, weight trend.  Assessment:   Diet has been upgraded to Heart Healthy. Patient is eating well most of the time. Is receiving medications with Ensure Pudding. Meal completion is 50-100%. Current intake with meals and supplements is adequate to meet nutrition needs.  Height: Ht Readings from Last 1 Encounters:  11/01/12 5\' 7"  (1.702 m)    Weight Status:   Wt Readings from Last 1 Encounters:  11/01/12 166 lb 9.6 oz (75.569 kg)  10/20/12  180 lb 12.4 oz (82 kg)    Body mass index is 26.09 kg/(m^2). overweight  Re-estimated needs:  Kcal: 1800-2000  Protein: 80-95g  Fluid: >1.8 L/day  Skin: no problems noted  Diet Order: Cardiac    Intake/Output Summary (Last 24 hours) at 11/02/12 1557 Last data filed at 11/02/12 1313  Gross per 24 hour  Intake    600 ml  Output      0 ml  Net    600 ml    Last BM: 5/14   Labs:   Recent Labs Lab 10/27/12 0625 10/30/12 1008  NA  --  138  K  --  4.0  CL  --  101  CO2  --  26  BUN  --  14  CREATININE 0.78 0.65  CALCIUM  --  9.9  GLUCOSE  --  139*    CBG (last 3)  No results found for this basename: GLUCAP,  in the last 72 hours  Scheduled Meds: . antiseptic oral rinse  15 mL Mouth Rinse BID  . aspirin  325 mg Oral Daily  . atorvastatin  40 mg Oral q1800  . baclofen  10 mg Oral QHS  . clonazePAM  0.25 mg Oral BID  . clonazePAM  0.5 mg Oral QHS  . clopidogrel  75 mg Oral Q breakfast  . enoxaparin (LOVENOX) injection  40 mg Subcutaneous Q24H  . feeding supplement  1 Container Oral TID BM  . loratadine  10 mg Oral Daily  . nicotine  14 mg Transdermal Daily   . venlafaxine  25 mg Oral TID WC    Continuous Infusions:   Joaquin Courts, RD, LDN, CNSC Pager [phone] After Hours Pager [phone]

## 2012-11-02 NOTE — Progress Notes (Signed)
Occupational Therapy Session Note  Patient Details  Name: Tiffany [name] MRN: [id number] Date of Birth: 01-May-1951  Today's Date: 11/02/2012 Time: 7846-9629 Time Calculation (min): 30 min  Short Term Goals: Week 3:  OT Short Term Goal 1 (Week 3): Pt will complete toilet transfer wtith supervision OT Short Term Goal 2 (Week 3): Pt will complete LB dressing with supervision OT Short Term Goal 3 (Week 3): Pt will complete shower transfer with min assist OT Short Term Goal 4 (Week 3): Pt will demonstrate use of RUE with grooming tasks with gross assist and min cues.  Skilled Therapeutic Interventions/Progress Updates:    Pt seen for 1:1 OT with focus on transfers with RW and functional use of RUE in sitting and standing.  Upon arrival, pt reports need to toilet.  Ambulated to toilet with PFRW and mod physical assistance and min verbal cues for RLE advancement and safety with RW.  Cues provided prior to stand to sit to remove RUE from platform, pt required physical assistance as not attending to Rt despite cues.  Provided support at elbow to eliminate gravity when reaching to turn on water with Rt hand.  Engaged in dominoes activity in sitting progressing to standing with horizontal movements to retrieve appropriate domino.  Pt required physical assistance in sitting with support at elbow to increase shoulder flexion against gravity, pt with increased motor control in standing as decreased gravity and shoulder flexion necessary for movement.  Pt reports pleasure with ability to carry out UE task this session.  Therapy Documentation Precautions:  Precautions Precautions: Fall Precaution Comments: right hemipareisis, motor apraxia Restrictions Weight Bearing Restrictions: No Pain: Pain Assessment Pain Assessment: No/denies pain Pain Score: 0-No pain  See FIM for current functional status  Therapy/Group: Individual Therapy  Leonette Monarch 11/02/2012, 2:56 PM

## 2012-11-03 ENCOUNTER — Inpatient Hospital Stay (HOSPITAL_COMMUNITY): Payer: BC Managed Care – PPO | Admitting: Physical Therapy

## 2012-11-03 ENCOUNTER — Inpatient Hospital Stay (HOSPITAL_COMMUNITY): Payer: BC Managed Care – PPO | Admitting: Occupational Therapy

## 2012-11-03 ENCOUNTER — Inpatient Hospital Stay (HOSPITAL_COMMUNITY): Payer: BC Managed Care – PPO | Admitting: Speech Pathology

## 2012-11-03 DIAGNOSIS — G8110 Spastic hemiplegia affecting unspecified side: Secondary | ICD-10-CM

## 2012-11-03 DIAGNOSIS — I6330 Cerebral infarction due to thrombosis of unspecified cerebral artery: Secondary | ICD-10-CM

## 2012-11-03 DIAGNOSIS — I69920 Aphasia following unspecified cerebrovascular disease: Secondary | ICD-10-CM

## 2012-11-03 LAB — URINALYSIS, ROUTINE W REFLEX MICROSCOPIC
Nitrite: NEGATIVE
Specific Gravity, Urine: 1.016 (ref 1.005–1.030)
Urobilinogen, UA: 0.2 mg/dL (ref 0.0–1.0)
pH: 6 (ref 5.0–8.0)

## 2012-11-03 LAB — URINE MICROSCOPIC-ADD ON

## 2012-11-03 LAB — CREATININE, SERUM
GFR calc Af Amer: >90 mL/min (ref >90)
GFR calc non Af Amer: 90 mL/min — ABNORMAL LOW (ref >90)

## 2012-11-03 MED ORDER — BACLOFEN 5 MG HALF TABLET
5.0000 mg | ORAL_TABLET | Freq: Every day | ORAL | Status: DC
  Administered 2012-11-03 – 2012-11-08 (×6): 5 mg via ORAL
  Filled 2012-11-03 (×8): qty 1

## 2012-11-03 NOTE — Progress Notes (Signed)
SLP Cancellation Note  Patient Details Name: Tiffany [name] MRN: [id number] DOB: January 09, 1951   Cancelled treatment:        Patient missed 30 minutes of skilled SLP services due to fatigue and request to rest for physical therapy session later.   Fae Pippin, M.A., CCC-SLP [phone]  Tiffany [name] 11/03/2012, 3:23 PM

## 2012-11-03 NOTE — Progress Notes (Signed)
Patient ID: Tiffany [name], female   DOB: 1951/04/07, 62 y.o.   MRN: [id number] Subjective/Complaints: 62 y.o. RH- female with history of CAD, HTN, TIA event a month ago; who developed difficulty speaking with right sided weakness on am of 10/16/12. CCT with bilateral subcortical hypodensities and patient treated with TPA. Carotid dopplers with <80% right ICA stenosis. 2 D echo with EF 60-65% and grade one dysfunction. Around midnight on 04/29 patient with progression of symptoms with dense right hemiparesis with inability to follow commands. Follow up CCT with developing focus of low attenuation left insular cortex and deep left frontal white matter, no hemorrhage. MRI brain done revealing L-MCA territory infarct affecting insula, operculum, left motor strip, superior parietal lobe, and left splenium. MRA brain with LMCA occlusion in M1 segment with no distal flow signal. 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 rate 18, height 5\' 7"  (1.702 m), weight 75.569 kg (166 lb 9.6 oz), SpO2 95.00%. No results found. Results for orders placed during the hospital encounter of 10/20/12 (from the past 72 hour(s))  CREATININE, SERUM     Status: Abnormal   Collection Time    11/03/12  5:30 AM      Result Value Range   Creatinine, Ser 0.73  0.50 - 1.10 mg/dL   GFR calc non Af Amer 90 (*) >90 mL/min   GFR calc Af Amer >90  >90 mL/min   Comment:            The eGFR has been calculated     using the CKD EPI equation.     This calculation has not been     validated in all clinical     situations.     eGFR's persistently     <90 mL/min signify     possible Chronic Kidney Disease.     HEENT: normal Cardio: RRR and no murmur Resp: CTA B/L and unlabored GI: BS positive and non  tender Extremity:  Pulses positive and No Edema Skin:   Intact Neuro: Flat, Cranial Nerve Abnormalities R central 7, Abnormal Sensory difficult to assess secondary to speech, Abnormal Motor 2-/5 in RUE, 3-/5 RLE, 5/5 on Left side, Abnormal FMC Ataxic/ dec FMC and Aphasic Musc/Skel:  Normal Gen NAD.  Names 4/4 simple objects,also names stethoscope Assessment/Plan: 1. 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 Physiatrist is providing close team supervision and 24 hour management of active medical problems listed below. Physiatrist and rehab team continue to assess barriers to discharge/monitor patient progress toward functional and medical goals. FIM: FIM - Bathing Bathing Steps Patient Completed: Chest;Right Arm;Left Arm;Abdomen;Front perineal area;Buttocks;Right upper leg;Left upper leg;Right lower leg (including foot);Left lower leg (including foot) Bathing: 5: Set-up assist to: Adjust water temp  FIM - Upper Body Dressing/Undressing Upper body dressing/undressing steps patient completed: Thread/unthread right sleeve of pullover shirt/dresss;Thread/unthread left sleeve of pullover shirt/dress;Put head through opening of pull over shirt/dress;Pull shirt over trunk Upper body dressing/undressing: 5: Supervision: Safety issues/verbal cues FIM - Lower Body Dressing/Undressing Lower body dressing/undressing steps patient completed: Thread/unthread right pants leg;Thread/unthread left pants leg;Pull pants up/down;Don/Doff right sock;Don/Doff left sock;Don/Doff right shoe;Don/Doff left shoe;Fasten/unfasten right shoe;Fasten/unfasten left shoe Lower body dressing/undressing: 4: Steadying Assist  FIM - Toileting  Toileting steps completed by patient: Adjust clothing prior to toileting;Performs perineal hygiene Toileting Assistive Devices: Grab bar or rail for support Toileting: 3:  Mod-Patient completed 2 of 3 steps  FIM - Diplomatic Services operational officer Devices: Grab bars;Walker Toilet Transfers: 3-To toilet/BSC: Mod A (lift or lower assist);4-From toilet/BSC: Min A (steadying Pt. > 75%)  FIM - Banker Devices: Walker;Orthosis Bed/Chair Transfer: 2: Bed > Chair or W/C: Max A (lift and lower assist);2: Chair or W/C > Bed: Max A (lift and lower assist)  FIM - Locomotion: Wheelchair Distance: 150 Locomotion: Wheelchair: 4: Travels 150 ft or more: maneuvers on rugs and over door sillls with minimal assistance (Pt.>75%) FIM - Locomotion: Ambulation Locomotion: Ambulation Assistive Devices: Designer, industrial/product Ambulation/Gait Assistance: 3: Mod assist Locomotion: Ambulation: 1: Travels less than 50 ft with moderate assistance (Pt: 50 - 74%)  Comprehension Comprehension Mode: Auditory Comprehension: 5-Understands complex 90% of the time/Cues < 10% of the time  Expression Expression Mode: Verbal Expression: 5-Expresses basic needs/ideas: With extra time/assistive device  Social Interaction Social Interaction: 6-Interacts appropriately with others with medication or extra time (anti-anxiety, antidepressant).  Problem Solving Problem Solving: 5-Solves basic problems: With no assist  Memory Memory: 5-Recognizes or recalls 90% of the time/requires cueing < 10% of the time  Medical Problem List and Plan:  1. DVT Prophylaxis/Anticoagulation: Pharmaceutical: Lovenox  2. Pain Management: Will resume norco prn for headaches.  3. Depression: . Will resume Effexor and klonopin as at home. Team to continue to provide Ego support and encouragement. Will have LCSW and psychologist follow for support.  4. Neuropsych: This patient is capable of making decisions on her own behalf.  5. HTN: will monitor with bid checks. Continue labetalol. Titrate medications as needed for better control. Occ BP elevation , mild tachy on  metoprolol at home 25mg , will start 12.5mg  and monitor 6. Dyslipidemia: Continue Lipitor.  7. CAD: Will continue lipitor and ASA. Monitor for symptoms with activity.  8. Dysphagia: Needs full supervision at meals. Will continue Modified diet per ST recommendations   LOS (Days) 14 A FACE TO FACE EVALUATION WAS PERFORMED  Milda Lindvall E 11/03/2012, 4:22 PM

## 2012-11-03 NOTE — Progress Notes (Signed)
Physical Therapy Weekly Progress Note  Patient Details  Name: Tiffany [name] MRN: [id number] Date of Birth: 1951/06/02  Today's Date: 11/03/2012 Time: 1102-1202 and 4098-1191 Time Calculation (min): 60 min and 66 min   Patient has good progress and has met 2 of 5 short term goals.  Patient is currently  Min A for bed mobility, min-mod A for bed <> w/c transfers with UE support, w/c mobility with hemi technique in controlled environment, mod A for gait in controlled environment with orthosis for DF assistance on RLE and min-mod A for stair negotiation with one rail.  Patient continues to demonstrate improved motor return in R side and will continue to assess patient's need for orthosis or AD and determine specific equipment needs closer to D/C.    Patient continues to demonstrate the following deficits: R sided hemiplegia with R sided inattention/neglect, impaired motor control, timing, sequencing, apraxia, impaired initiation, impaired postural control, dynamic balance, gait, impaired activity tolerance and therefore will continue to benefit from skilled PT intervention to enhance overall performance with activity tolerance, balance, postural control, ability to compensate for deficits, functional use of  right upper extremity and right lower extremity, attention, awareness and coordination.  Patient progressing toward long term goals..  Continue plan of care.  PT Short Term Goals Week 2:  PT Short Term Goal 1 (Week 2): Patient will perform bed mobility from flat bed, no rails to L and R consistently with min A PT Short Term Goal 1 - Progress (Week 2): Met PT Short Term Goal 2 (Week 2): Patient will perform bed <> w/c transfers to L and R min A PT Short Term Goal 2 - Progress (Week 2): Progressing toward goal PT Short Term Goal 3 (Week 2): Patient will perform w/c mobility x 150' in controlled environment with hemi technique or bilat foot propulsion with min A PT Short Term Goal 3 - Progress  (Week 2): Progressing toward goal PT Short Term Goal 4 (Week 2): Patient will perform gait in controlled environment x 100' with orthosis and LRAD and min A overall PT Short Term Goal 4 - Progress (Week 2): Progressing toward goal PT Short Term Goal 5 (Week 2): Patient will ascend and descend 8 steps with one rail and mod A overall  Week 3:  PT Short Term Goal 1 (Week 3): = LTG  Skilled Therapeutic Interventions/Progress Updates:   Pt reporting not feeling well today; reports feeling sleepy and having weakness in LUE and LLE with difficulty transferring L side OOB and difficulty with transfers.  Pt also reporting HA on L side of head and across eyes.  Vitals assessed and RN notified who alerted PA.  Patient reported she could participate in therapy.  Re-assessment of L side: LLE 2/5 hip flexion, 3-4/5 knee extension, flexion and ankle DF.  Discussed with patient and family transportation options after D/C.  Reports daughter has low [employer].  Performed simulated low car transfer with stand pivot and UE support on car door with mod-max A for lifting assistance, assistance for weight shifting to allow RLE pivoting and verbal cues for sequencing and assistance for lowering to seat.  Also required assistance to place RLE into and out of car.  Discussed with daughter performing real car transfer early next week.  Performed stand pivot training with RW and addition of hand orthosis to R side to assist with grip to L and R mat <> w/c with mod A overall with lifting assistance, total verbal cues for sequencing with RW and  pivoting of R foot with mod A for balance and weight shifting.  While standing statically to remove hand from orthosis patient begins to lean to R and RLE begins to flex and unable to stabilize and balance patient without UE support.  Discussed f/u therapy, equipment needs and ramp again with family in the room with SW present.  Pt and husband will likely benefit from transport w/c for community  distances and lighter weight to assist husband with loading w/c in/out of car.    PM session: Patient asleep in bed; extra time needed to arouse from deep sleep.  Performed supine > sit from flat bed, no rail with total verbal cues for sequence and mod-max A to roll to R side and side to sit EOB.  Pt reporting need to urinate.  Performed stand pivot transfers bed > w/c <> toilet with UE support on therapist or on grab bar with mod-max A overall for full pivot with assistance for lateral weight shifting and verbal cues to initiate RLE advancement and pivot.  Required assistance with clothing doffing and donning during toileting but able to complete hygiene in sitting.  During standing for clothing management patient unable to maintain RLE extension for stabilization during static stance.  Once in gym performed NMR and pre-gait training for RLE timing, sequencing and sustained activation of RLE extensors during RLE step ups on 2" step with LLE stepping up to step and then over step to mimic gait sequence and to facilitate full anterior translation of COG and step length forwards and retro.  Also performed RLE stance training with RLE sustained activation of extensors during LLE foot taps to 2" step with use of mirror as visual feedback for trunk, hip and knee positioning and min-mod A overall.  Carry over to gait with RW with R hand orthosis x 30' with min-mod A overall with PT decreasing verbal and tactile cues and allowing patient increased time to initiate L lateral weight shifting, upright trunk and full RLE hip and knee flexion during swing for full foot clearance and step length.  Still unable to carryover RLE extension activation during stance phase of gait and still required assistance to maintain safe distance to RW.  Returned to room to sit up in w/c for dinner.    Therapy Documentation Precautions:  Precautions Precautions: Fall Precaution Comments: right hemipareisis, motor  apraxia Restrictions Weight Bearing Restrictions: No Vital Signs: Therapy Vitals Temp: 98.7 F (37.1 C) Temp src: Oral Pulse Rate: 102 Resp: 18 BP: 135/82 mmHg Patient Position, if appropriate: Sitting Oxygen Therapy SpO2: 95 % O2 Device: None (Room air) Pain: Pain Assessment Pain Assessment: 0-10 Pain Score:   3 Pain Type: Acute pain Pain Location: Head Pain Orientation: Left;Anterior Pain Descriptors: Headache Pain Onset: On-going Patients Stated Pain Goal: 0 Pain Intervention(s): RN made aware Locomotion : Wheelchair Mobility Distance: 150  See FIM for current functional status  Therapy/Group: Individual Therapy  Edman Circle [facility] 11/03/2012, 12:27 PM

## 2012-11-03 NOTE — Progress Notes (Signed)
Occupational Therapy Session Note  Patient Details  Name: Tiffany [name] MRN: [id number] Date of Birth: May 12, 1951  Today's Date: 11/03/2012 Time: 0915-1000 Time Calculation (min): 45 min  Short Term Goals: Week 3:  OT Short Term Goal 1 (Week 3): Pt will complete toilet transfer wtith supervision OT Short Term Goal 2 (Week 3): Pt will complete LB dressing with supervision OT Short Term Goal 3 (Week 3): Pt will complete shower transfer with min assist OT Short Term Goal 4 (Week 3): Pt will demonstrate use of RUE with grooming tasks with gross assist and min cues.  Skilled Therapeutic Interventions/Progress Updates:    Pt seen for ADL retraining with focus on sit <> stand, carryover of hemi-technique with dressing tasks, and NM re-ed with focus on RUE use in functional tasks. Engaged in dressing and grooming at sink.  Pt with increased sequencing with dressing tasks this session and increased ability to recognize mistakes with min verbal cues.  Pt with hand over hand with LUE guiding RUE when turning on water and obtaining grooming items.  Engaged in oral hygiene in standing with occasional blocking at Rt knee with cues for pt to extend.  Completed simple wrist flexion/extension and ulnar/radial deviation with LUE assistance.  Therapy Documentation Precautions:  Precautions Precautions: Fall Precaution Comments: right hemipareisis, motor apraxia Restrictions Weight Bearing Restrictions: No General:   Vital Signs: Therapy Vitals Temp: 97.9 F (36.6 C) Temp src: Oral Pulse Rate: 116 Resp: 18 BP: 118/78 mmHg Patient Position, if appropriate: Sitting Oxygen Therapy SpO2: 95 % O2 Device: None (Room air) Pain: Pain Assessment Pain Assessment: 0-10 Pain Score:   3 Pain Type: Acute pain Pain Location: Head Pain Orientation: Right;Left;Anterior Pain Descriptors: Headache Patients Stated Pain Goal: 0 Pain Intervention(s): Medication (See eMAR)  See FIM for current functional  status  Therapy/Group: Individual Therapy  Leonette Monarch 11/03/2012, 12:05 PM

## 2012-11-03 NOTE — Progress Notes (Signed)
Social Work Patient ID: Tiffany [name], female   DOB: 1951-03-28, 62 y.o.   MRN: [id number] Met with pt, husband and daughter who were here for PT therapy.  Discussed follow up therapies and DME.  Pt wants to start off with home health therapies and transition to OP due to endurance issues.  Pt has a walker, needs a transport chair.  Will also check with OT regarding DME.  All pleased with how Well pt is progressing.  Will plan for discharge 5/22 after therapy , pt requests this due to her [birth year] on [birth year].  Prefers [facility] before. Continue to work toward discharge next week.

## 2012-11-04 DIAGNOSIS — G8110 Spastic hemiplegia affecting unspecified side: Secondary | ICD-10-CM

## 2012-11-04 DIAGNOSIS — I6330 Cerebral infarction due to thrombosis of unspecified cerebral artery: Secondary | ICD-10-CM

## 2012-11-04 DIAGNOSIS — I69920 Aphasia following unspecified cerebrovascular disease: Secondary | ICD-10-CM

## 2012-11-04 LAB — URINE CULTURE

## 2012-11-04 NOTE — Progress Notes (Signed)
Patient ID: Tiffany [name], female   DOB: 01-10-1951, 62 y.o.   MRN: [id number] Subjective/Complaints: 62 y.o. RH- female with history of CAD, HTN, TIA event a month ago; who developed difficulty speaking with right sided weakness on am of 10/16/12. CCT with bilateral subcortical hypodensities and patient treated with TPA. Carotid dopplers with <80% right ICA stenosis. 2 D echo with EF 60-65% and grade one dysfunction. Around midnight on 04/29 patient with progression of symptoms with dense right hemiparesis with inability to follow commands. Follow up CCT with developing focus of low attenuation left insular cortex and deep left frontal white matter, no hemorrhage. MRI brain done revealing L-MCA territory infarct affecting insula, operculum, left motor strip, superior parietal lobe, and left splenium. MRA brain with LMCA occlusion in M1 segment with no distal flow signal. 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 Up to bathroom this am.  Slept well Review of Systems   A 12 point review of systems has been performed and if not noted above is otherwise negative.    Objective: Vital Signs: Blood pressure 137/83, pulse 102, temperature 98.1 F (36.7 C), temperature source Oral, resp. rate 16, height 5\' 7"  (1.702 m), weight 75.569 kg (166 lb 9.6 oz), SpO2 91.00%. No results found. Results for orders placed during the hospital encounter of 10/20/12 (from the past 72 hour(s))  CREATININE, SERUM     Status: Abnormal   Collection Time    11/03/12  5:30 AM      Result Value Range   Creatinine, Ser 0.73  0.50 - 1.10 mg/dL   GFR calc non Af Amer 90 (*) >90 mL/min   GFR calc Af Amer >90  >90 mL/min   Comment:            The eGFR has been calculated     using the CKD EPI equation.     This calculation has not been     validated in all clinical     situations.     eGFR's persistently     <90 mL/min signify     possible Chronic Kidney Disease.  URINALYSIS, ROUTINE W  REFLEX MICROSCOPIC     Status: Abnormal   Collection Time    11/03/12  4:00 PM      Result Value Range   Color, Urine YELLOW  YELLOW   APPearance HAZY (*) CLEAR   Specific Gravity, Urine 1.016  1.005 - 1.030   pH 6.0  5.0 - 8.0   Glucose, UA NEGATIVE  NEGATIVE mg/dL   Hgb urine dipstick NEGATIVE  NEGATIVE   Bilirubin Urine NEGATIVE  NEGATIVE   Ketones, ur NEGATIVE  NEGATIVE mg/dL   Protein, ur NEGATIVE  NEGATIVE mg/dL   Urobilinogen, UA 0.2  0.0 - 1.0 mg/dL   Nitrite NEGATIVE  NEGATIVE   Leukocytes, UA TRACE (*) NEGATIVE  URINE MICROSCOPIC-ADD ON     Status: Abnormal   Collection Time    11/03/12  4:00 PM      Result Value Range   Squamous Epithelial / LPF FEW (*) RARE   WBC, UA 0-2  <3 WBC/hpf   Bacteria, UA RARE  RARE   Casts HYALINE CASTS (*) NEGATIVE   Urine-Other MUCOUS PRESENT       HEENT: normal Cardio: RRR and no murmur Resp: CTA B/L and unlabored GI: BS positive and non tender Extremity:  Pulses positive and No Edema Skin:   Intact Neuro: Flat, Cranial Nerve Abnormalities R central 7, Abnormal  Sensory difficult to assess secondary to speech, Abnormal Motor 2-/5 in RUE, 3-/5 RLE, 5/5 on Left side, Abnormal FMC Ataxic/ dec FMC and Aphasic Musc/Skel:  Normal Gen NAD.  Names 4/4 simple objects,also names stethoscope Assessment/Plan: 1. 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 Physiatrist is providing close team supervision and 24 hour management of active medical problems listed below. Physiatrist and rehab team continue to assess barriers to discharge/monitor patient progress toward functional and medical goals. FIM: FIM - Bathing Bathing Steps Patient Completed: Chest;Right Arm;Left Arm;Abdomen;Front perineal area;Buttocks;Right upper leg;Left upper leg;Right lower leg (including foot);Left lower leg (including foot) Bathing: 5: Set-up assist to: Adjust  water temp  FIM - Upper Body Dressing/Undressing Upper body dressing/undressing steps patient completed: Thread/unthread right sleeve of pullover shirt/dresss;Thread/unthread left sleeve of pullover shirt/dress;Put head through opening of pull over shirt/dress;Pull shirt over trunk Upper body dressing/undressing: 5: Supervision: Safety issues/verbal cues FIM - Lower Body Dressing/Undressing Lower body dressing/undressing steps patient completed: Thread/unthread right pants leg;Thread/unthread left pants leg;Pull pants up/down;Don/Doff right sock;Don/Doff left sock;Don/Doff right shoe;Don/Doff left shoe;Fasten/unfasten right shoe;Fasten/unfasten left shoe Lower body dressing/undressing: 4: Steadying Assist  FIM - Toileting Toileting steps completed by patient: Adjust clothing prior to toileting;Performs perineal hygiene Toileting Assistive Devices: Grab bar or rail for support Toileting: 3: Mod-Patient completed 2 of 3 steps  FIM - Diplomatic Services operational officer Devices: Grab bars;Walker Toilet Transfers: 3-To toilet/BSC: Mod A (lift or lower assist);4-From toilet/BSC: Min A (steadying Pt. > 75%)  FIM - Banker Devices: Walker;Orthosis Bed/Chair Transfer: 2: Bed > Chair or W/C: Max A (lift and lower assist);2: Chair or W/C > Bed: Max A (lift and lower assist)  FIM - Locomotion: Wheelchair Distance: 150 Locomotion: Wheelchair: 4: Travels 150 ft or more: maneuvers on rugs and over door sillls with minimal assistance (Pt.>75%) FIM - Locomotion: Ambulation Locomotion: Ambulation Assistive Devices: Designer, industrial/product Ambulation/Gait Assistance: 3: Mod assist Locomotion: Ambulation: 1: Travels less than 50 ft with moderate assistance (Pt: 50 - 74%)  Comprehension Comprehension Mode: Auditory Comprehension: 5-Understands complex 90% of the time/Cues < 10% of the time  Expression Expression Mode: Verbal Expression: 5-Expresses basic  needs/ideas: With extra time/assistive device  Social Interaction Social Interaction: 6-Interacts appropriately with others with medication or extra time (anti-anxiety, antidepressant).  Problem Solving Problem Solving: 5-Solves basic problems: With no assist  Memory Memory: 5-Recognizes or recalls 90% of the time/requires cueing < 10% of the time  Medical Problem List and Plan:  1. DVT Prophylaxis/Anticoagulation: Pharmaceutical: Lovenox  2. Pain Management: Will resume norco prn for headaches.  3. Depression: . Will resume Effexor and klonopin as at home. Team to continue to provide Ego support and encouragement. Will have LCSW and psychologist follow for support.  4. Neuropsych: This patient is capable of making decisions on her own behalf.  5. HTN: will monitor with bid checks. Continue labetalol. Titrate medications as needed for better control. Occ BP elevation , mild tachy on metoprolol at home 25mg ,  12.5mg , bid currently- monitor 6. Dyslipidemia: Continue Lipitor.  7. CAD: Will continue lipitor and ASA. Monitor for symptoms with activity.  8. Dysphagia: Needs full supervision at meals. Will continue Modified diet per ST recommendations   LOS (Days) 15 A FACE TO FACE EVALUATION WAS PERFORMED  Marion Rosenberry T 11/04/2012, 8:47 AM

## 2012-11-05 ENCOUNTER — Inpatient Hospital Stay (HOSPITAL_COMMUNITY): Payer: BC Managed Care – PPO | Admitting: Occupational Therapy

## 2012-11-05 ENCOUNTER — Inpatient Hospital Stay (HOSPITAL_COMMUNITY): Payer: BC Managed Care – PPO | Admitting: Physical Therapy

## 2012-11-05 MED ORDER — METOPROLOL TARTRATE 25 MG PO TABS
25.0000 mg | ORAL_TABLET | Freq: Two times a day (BID) | ORAL | Status: DC
  Administered 2012-11-05 – 2012-11-08 (×7): 25 mg via ORAL
  Filled 2012-11-05 (×10): qty 1

## 2012-11-05 NOTE — Progress Notes (Signed)
Occupational Therapy Session Note  Patient Details  Name: Tiffany [name] MRN: [id number] Date of Birth: [birth year]  Today's Date: 11/05/2012 Time: 0830-0930 Time Calculation (min): 60 min  Skilled Therapeutic Interventions/Progress Updates: ADL in w/c at sink with focus on proper placement and stability for R LE in standing, R UE integration/use, HOH assist to use R UE to clean/dress L UE.      Therapy Documentation Precautions:  Precautions Precautions: Fall Precaution Comments: right hemipareisis, motor apraxia Restrictions Weight Bearing Restrictions: No  Pain: Pain Assessment Pain Assessment: No/denies pain  See FIM for current functional status  Therapy/Group: Individual Therapy  Bud [facility] 11/05/2012, 7:47 AM

## 2012-11-05 NOTE — Progress Notes (Signed)
Patient ID: Tiffany [name], female   DOB: [birth year], 62 y.o.   MRN: [id number] Subjective/Complaints: 62 y.o. RH- female with history of CAD, HTN, TIA event a month ago; who developed difficulty speaking with right sided weakness on am of 10/16/12. CCT with bilateral subcortical hypodensities and patient treated with TPA. Carotid dopplers with <80% right ICA stenosis. 2 D echo with EF 60-65% and grade one dysfunction. Around midnight on 04/29 patient with progression of symptoms with dense right hemiparesis with inability to follow commands. Follow up CCT with developing focus of low attenuation left insular cortex and deep left frontal white matter, no hemorrhage. MRI brain done revealing L-MCA territory infarct affecting insula, operculum, left motor strip, superior parietal lobe, and left splenium. MRA brain with LMCA occlusion in M1 segment with no distal flow signal. 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 In good spirits. No problems yesterday or last night.   Review of Systems   A 12 point review of systems has been performed and if not noted above is otherwise negative.    Objective: Vital Signs: Blood pressure 121/77, pulse 101, temperature 98.1 F (36.7 C), temperature source Oral, resp. rate 18, height 5\' 7"  (1.702 m), weight 75.569 kg (166 lb 9.6 oz), SpO2 93.00%. No results found. Results for orders placed during the hospital encounter of 10/20/12 (from the past 72 hour(s))  CREATININE, SERUM     Status: Abnormal   Collection Time    11/03/12  5:30 AM      Result Value Range   Creatinine, Ser 0.73  0.50 - 1.10 mg/dL   GFR calc non Af Amer 90 (*) >90 mL/min   GFR calc Af Amer >90  >90 mL/min   Comment:            The eGFR has been calculated     using the CKD EPI equation.     This calculation has not been     validated in all clinical     situations.     eGFR's persistently     <90 mL/min signify     possible Chronic Kidney Disease.  URINALYSIS,  ROUTINE W REFLEX MICROSCOPIC     Status: Abnormal   Collection Time    11/03/12  4:00 PM      Result Value Range   Color, Urine YELLOW  YELLOW   APPearance HAZY (*) CLEAR   Specific Gravity, Urine 1.016  1.005 - 1.030   pH 6.0  5.0 - 8.0   Glucose, UA NEGATIVE  NEGATIVE mg/dL   Hgb urine dipstick NEGATIVE  NEGATIVE   Bilirubin Urine NEGATIVE  NEGATIVE   Ketones, ur NEGATIVE  NEGATIVE mg/dL   Protein, ur NEGATIVE  NEGATIVE mg/dL   Urobilinogen, UA 0.2  0.0 - 1.0 mg/dL   Nitrite NEGATIVE  NEGATIVE   Leukocytes, UA TRACE (*) NEGATIVE  URINE CULTURE     Status: None   Collection Time    11/03/12  4:00 PM      Result Value Range   Specimen Description URINE, RANDOM     Special Requests NONE     Culture  Setup Time 11/03/2012 16:19     Colony Count 1,000 COLONIES/ML     Culture INSIGNIFICANT GROWTH     Report Status 11/04/2012 FINAL    URINE MICROSCOPIC-ADD ON     Status: Abnormal   Collection Time    11/03/12  4:00 PM      Result Value Range  Squamous Epithelial / LPF FEW (*) RARE   WBC, UA 0-2  <3 WBC/hpf   Bacteria, UA RARE  RARE   Casts HYALINE CASTS (*) NEGATIVE   Urine-Other MUCOUS PRESENT       HEENT: normal Cardio: RRR and no murmur Resp: CTA B/L and unlabored GI: BS positive and non tender Extremity:  Pulses positive and No Edema Skin:   Intact Neuro: Flat, Cranial Nerve Abnormalities R central 7, Abnormal Sensory difficult to assess secondary to speech, Abnormal Motor 2-/5 in RUE, 3-/5 RLE, 5/5 on Left side, Abnormal FMC Ataxic/ dec FMC and Aphasic Musc/Skel:  Normal Gen NAD.  Names 4/4 simple objects,also names stethoscope   Assessment/Plan: 1. 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 Physiatrist is providing close team supervision and 24 hour management of active medical problems listed below. Physiatrist and rehab team continue to assess barriers  to discharge/monitor patient progress toward functional and medical goals. FIM: FIM - Bathing Bathing Steps Patient Completed: Chest;Right Arm;Left Arm;Abdomen;Front perineal area;Buttocks;Right upper leg;Left upper leg;Right lower leg (including foot);Left lower leg (including foot) Bathing: 5: Set-up assist to: Adjust water temp  FIM - Upper Body Dressing/Undressing Upper body dressing/undressing steps patient completed: Thread/unthread right sleeve of pullover shirt/dresss;Thread/unthread left sleeve of pullover shirt/dress;Put head through opening of pull over shirt/dress;Pull shirt over trunk Upper body dressing/undressing: 5: Supervision: Safety issues/verbal cues FIM - Lower Body Dressing/Undressing Lower body dressing/undressing steps patient completed: Thread/unthread right pants leg;Thread/unthread left pants leg;Pull pants up/down;Don/Doff right sock;Don/Doff left sock;Don/Doff right shoe;Don/Doff left shoe;Fasten/unfasten right shoe;Fasten/unfasten left shoe Lower body dressing/undressing: 4: Steadying Assist  FIM - Toileting Toileting steps completed by patient: Adjust clothing prior to toileting;Performs perineal hygiene Toileting Assistive Devices: Grab bar or rail for support Toileting: 3: Mod-Patient completed 2 of 3 steps  FIM - Diplomatic Services operational officer Devices: Grab bars;Walker Toilet Transfers: 3-To toilet/BSC: Mod A (lift or lower assist);4-From toilet/BSC: Min A (steadying Pt. > 75%)  FIM - Banker Devices: Walker;Orthosis Bed/Chair Transfer: 2: Bed > Chair or W/C: Max A (lift and lower assist);2: Chair or W/C > Bed: Max A (lift and lower assist)  FIM - Locomotion: Wheelchair Distance: 150 Locomotion: Wheelchair: 4: Travels 150 ft or more: maneuvers on rugs and over door sillls with minimal assistance (Pt.>75%) FIM - Locomotion: Ambulation Locomotion: Ambulation Assistive Devices: Dealer Ambulation/Gait Assistance: 3: Mod assist Locomotion: Ambulation: 1: Travels less than 50 ft with moderate assistance (Pt: 50 - 74%)  Comprehension Comprehension Mode: Auditory Comprehension: 5-Understands complex 90% of the time/Cues < 10% of the time  Expression Expression Mode: Verbal Expression: 5-Expresses basic needs/ideas: With extra time/assistive device  Social Interaction Social Interaction: 6-Interacts appropriately with others with medication or extra time (anti-anxiety, antidepressant).  Problem Solving Problem Solving: 5-Solves basic problems: With no assist  Memory Memory: 5-Recognizes or recalls 90% of the time/requires cueing < 10% of the time  Medical Problem List and Plan:  1. DVT Prophylaxis/Anticoagulation: Pharmaceutical: Lovenox  2. Pain Management: Will resume norco prn for headaches.  3. Depression: . Will resume Effexor and klonopin as at home. Team to continue to provide Ego support and encouragement. Will have LCSW and psychologist follow for support.  4. Neuropsych: This patient is capable of making decisions on her own behalf.  5. HTN: will monitor with bid checks.  . Titrate medications as needed for better control. Occ BP elevation ,  mild tachy on metoprolol at home 25mg ---some tachy and HTN recently--will push to 25mg  bid and see how she responds 6. Dyslipidemia: Continue Lipitor.  7. CAD: Will continue lipitor and ASA. Monitor for symptoms with activity.  8. Dysphagia: Needs full supervision at meals. Will continue Modified diet per ST recommendations   LOS (Days) 16 A FACE TO FACE EVALUATION WAS PERFORMED  Taye Cato T 11/05/2012, 9:04 AM

## 2012-11-06 ENCOUNTER — Inpatient Hospital Stay (HOSPITAL_COMMUNITY): Payer: BC Managed Care – PPO | Admitting: Physical Therapy

## 2012-11-06 ENCOUNTER — Inpatient Hospital Stay (HOSPITAL_COMMUNITY): Payer: BC Managed Care – PPO | Admitting: Occupational Therapy

## 2012-11-06 ENCOUNTER — Inpatient Hospital Stay (HOSPITAL_COMMUNITY): Payer: BC Managed Care – PPO | Admitting: Speech Pathology

## 2012-11-06 ENCOUNTER — Encounter (HOSPITAL_COMMUNITY): Payer: BC Managed Care – PPO

## 2012-11-06 DIAGNOSIS — I69920 Aphasia following unspecified cerebrovascular disease: Secondary | ICD-10-CM

## 2012-11-06 DIAGNOSIS — G8110 Spastic hemiplegia affecting unspecified side: Secondary | ICD-10-CM

## 2012-11-06 DIAGNOSIS — I6330 Cerebral infarction due to thrombosis of unspecified cerebral artery: Secondary | ICD-10-CM

## 2012-11-06 NOTE — Progress Notes (Signed)
Social Work Patient ID: Tiffany [name], female   DOB: [birth year], 62 y.o.   MRN: [id number] Spoke with [employer] to fax team conference note too.  Aware discharge set for 5/22 and recommendations for follow up.

## 2012-11-06 NOTE — Progress Notes (Signed)
Occupational Therapy Session Note  Patient Details  Name: Tiffany [name] MRN: [id number] Date of Birth: 1951/01/01  Today's Date: 11/06/2012 Time: 4098-1191 and 4782-9562 Time Calculation (min): 58 min and 48 min  Short Term Goals: Week 3:  OT Short Term Goal 1 (Week 3): Pt will complete toilet transfer wtith supervision OT Short Term Goal 2 (Week 3): Pt will complete LB dressing with supervision OT Short Term Goal 3 (Week 3): Pt will complete shower transfer with min assist OT Short Term Goal 4 (Week 3): Pt will demonstrate use of RUE with grooming tasks with gross assist and min cues.  Skilled Therapeutic Interventions/Progress Updates:    1) Pt seen for ADL retraining at sit <> stand level at sink with focus on proper placement of RLE prior to standing, RUE integration in self-care tasks.  Pt required intermittent HOH assist to increase grasp with gross assist.  Pt very tearful this session, reports having attended the stroke support group on Sunday and having reality set in.  Pt continues to require min-mod assist with sit <> stand secondary to decreased weight shifting and Rt attention.  2) Pt seen for 1:1 OT with focus on bed mobility from flat bed, squat pivot transfers, and NM re-ed with Bioness.  Pt's husband and daughter present, discussed bathroom setup and need for DME and need for family education prior to d/c.  Mod verbal cues for technique and use of BUE to assist in pushing up from sidelying to sitting.  Min assist squat pivot bed to w/c with cues for RLE placement and weight shifting.  Engaged in NM re-ed in sitting with use of Bioness with focus on grasp and release.  Pt with increased carryover of grasp and release post Bioness training.  Able to elicit 5th digit finger extension this session.  Educated on self-ROM and opposition exercises to perform between sessions.  Therapy Documentation Precautions:  Precautions Precautions: Fall Precaution Comments: right hemipareisis,  motor apraxia Restrictions Weight Bearing Restrictions: No General:   Vital Signs: Therapy Vitals Pulse Rate: 106 BP: 134/79 mmHg Pain:  Pt with no c/o pain this session.  See FIM for current functional status  Therapy/Group: Individual Therapy  Leonette Monarch 11/06/2012, 12:08 PM

## 2012-11-06 NOTE — Progress Notes (Signed)
Physical Therapy Session Note  Patient Details  Name: Tiffany [name] MRN: [id number] Date of Birth: February 07, 1951  Today's Date: 11/06/2012 Time: 1102-1203 Time Calculation (min): 61 min  Short Term Goals: Week 1:  PT Short Term Goal 1 (Week 1): Patient will be able to perform bed mobility with min-A PT Short Term Goal 1 - Progress (Week 1): Met PT Short Term Goal 2 (Week 1): Patient will be able to perform transfers with min-A PT Short Term Goal 2 - Progress (Week 1): Progressing toward goal PT Short Term Goal 3 (Week 1): Patient will be able to ambulate 20 feet using LRAD with min-A PT Short Term Goal 3 - Progress (Week 1): Progressing toward goal PT Short Term Goal 4 (Week 1): Patient will be able to ascend/descend 4 steps using hand rail with Mod-A PT Short Term Goal 4 - Progress (Week 1): Met Week 2:  PT Short Term Goal 1 (Week 2): Patient will perform bed mobility from flat bed, no rails to L and R consistently with min A PT Short Term Goal 1 - Progress (Week 2): Met PT Short Term Goal 2 (Week 2): Patient will perform bed <> w/c transfers to L and R min A PT Short Term Goal 2 - Progress (Week 2): Progressing toward goal PT Short Term Goal 3 (Week 2): Patient will perform w/c mobility x 150' in controlled environment with hemi technique or bilat foot propulsion with min A PT Short Term Goal 3 - Progress (Week 2): Progressing toward goal PT Short Term Goal 4 (Week 2): Patient will perform gait in controlled environment x 100' with orthosis and LRAD and min A overall PT Short Term Goal 4 - Progress (Week 2): Progressing toward goal PT Short Term Goal 5 (Week 2): Patient will ascend and descend 8 steps with one rail and mod A overall  Week 3:  PT Short Term Goal 1 (Week 3): = LTG  Skilled Therapeutic Interventions/Progress Updates:   Patient's family present; reporting that she is sleeping poorly secondary to inability to change positions in bed.  Provided family with handout with  pictures of ways to position hemiplegia side during supine and R and L sidelying in bed and demonstrated to family and patient.  Also discussed with family other equipment needs and home modifications to improve patient's independence with transfers (lower bed, raise couch, raised toilet seat and use of BSC in bed room).  Daughter also asking about treadmill for patient to use for home exercise.  Discussed with daughter all the safety concerns with use of treadmill at home and strongly recommended that patient go through HHPT and outpatient PT and let f/u PT and MD determine when patient would be safe to exercise with treadmill.  Family verbalized understanding and agreement.  Performed actual car transfer training with patient and family with daughter and patient demonstrating how they performed transfer PTA; daughter lifted patient from w/c with mod-max A and allowed patient to hold on to her while they pivoted and patient stood on affected RLE and stepped LLE into car.  Discussed safety issues with patient and family and demonstrated another method to family with different positioning of w/c, w/c parts management, patient standing from w/c with min A and pivoting with UE support on car door and sitting first and then placing LE into car.  Husband gave repeat demonstration providing min A and with therapist providing husband verbal and visual cues for safe hand placement and patient's foot placement.  Patient and family verbalized  understanding.    Therapy Documentation Precautions:  Precautions Precautions: Fall Precaution Comments: right hemipareisis, motor apraxia Restrictions Weight Bearing Restrictions: No Vital Signs: Therapy Vitals Pulse Rate: 106 BP: 134/79 mmHg Pain: Pain Assessment Pain Assessment: No/denies pain  See FIM for current functional status  Therapy/Group: Individual Therapy  Edman Circle [facility] 11/06/2012, 12:23 PM

## 2012-11-06 NOTE — Progress Notes (Signed)
Patient ID: Tiffany [name], female   DOB: 1950-12-18, 62 y.o.   MRN: [id number] Subjective/Complaints: 62 y.o. RH- female with history of CAD, HTN, TIA event a month ago; who developed difficulty speaking with right sided weakness on am of 10/16/12. CCT with bilateral subcortical hypodensities and patient treated with TPA. Carotid dopplers with <80% right ICA stenosis. 2 D echo with EF 60-65% and grade one dysfunction. Around midnight on 04/29 patient with progression of symptoms with dense right hemiparesis with inability to follow commands. Follow up CCT with developing focus of low attenuation left insular cortex and deep left frontal white matter, no hemorrhage. MRI brain done revealing L-MCA territory infarct affecting insula, operculum, left motor strip, superior parietal lobe, and left splenium. MRA brain with LMCA occlusion in M1 segment with no distal flow signal. 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    Objective: Vital Signs: Blood pressure 134/79, pulse 116, temperature 98 F (36.7 C), temperature source Oral, resp. rate 18, height 5\' 7"  (1.702 m), weight 75.569 kg (166 lb 9.6 oz), SpO2 92.00%. No results found. Results for orders placed during the hospital encounter of 10/20/12 (from the past 72 hour(s))  URINALYSIS, ROUTINE W REFLEX MICROSCOPIC     Status: Abnormal   Collection Time    11/03/12  4:00 PM      Result Value Range   Color, Urine YELLOW  YELLOW   APPearance HAZY (*) CLEAR   Specific Gravity, Urine 1.016  1.005 - 1.030   pH 6.0  5.0 - 8.0   Glucose, UA NEGATIVE  NEGATIVE mg/dL   Hgb urine dipstick NEGATIVE  NEGATIVE   Bilirubin Urine NEGATIVE  NEGATIVE   Ketones, ur NEGATIVE  NEGATIVE mg/dL   Protein, ur NEGATIVE  NEGATIVE mg/dL   Urobilinogen, UA 0.2  0.0 - 1.0 mg/dL   Nitrite NEGATIVE   NEGATIVE   Leukocytes, UA TRACE (*) NEGATIVE  URINE CULTURE     Status: None   Collection Time    11/03/12  4:00 PM      Result Value Range   Specimen Description URINE, RANDOM     Special Requests NONE     Culture  Setup Time 11/03/2012 16:19     Colony Count 1,000 COLONIES/ML     Culture INSIGNIFICANT GROWTH     Report Status 11/04/2012 FINAL    URINE MICROSCOPIC-ADD ON     Status: Abnormal   Collection Time    11/03/12  4:00 PM      Result Value Range   Squamous Epithelial / LPF FEW (*) RARE   WBC, UA 0-2  <3 WBC/hpf   Bacteria, UA RARE  RARE   Casts HYALINE CASTS (*) NEGATIVE   Urine-Other MUCOUS PRESENT       HEENT: normal Cardio: RRR and no murmur Resp: CTA B/L and unlabored GI: BS positive and non tender Extremity:  Pulses positive and No Edema Skin:   Intact Neuro: Flat, Cranial Nerve Abnormalities R central 7, Abnormal Sensory difficult to assess secondary to speech, Abnormal Motor 2-/5 in RUE, 3-/5 RLE, 5/5 on Left side, Abnormal FMC Ataxic/ dec FMC and Aphasic Musc/Skel:  Normal Gen NAD.  Names 4/4 simple objects   Assessment/Plan: 1. 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 Physiatrist is providing close team supervision and 24 hour management of active medical problems listed below. Physiatrist and rehab team continue to assess barriers to discharge/monitor patient progress toward functional and medical goals. FIM: FIM - Bathing Bathing Steps Patient Completed: Chest;Right Arm;Abdomen;Front perineal area;Right upper leg;Left upper leg;Right lower leg (including foot);Left lower leg (including foot) Bathing: 4: Min-Patient completes 8-9 39f 10 parts or 75+ percent  FIM - Upper Body Dressing/Undressing Upper body dressing/undressing steps patient completed: Thread/unthread right bra strap;Thread/unthread left bra strap;Thread/unthread left sleeve of  pullover shirt/dress;Put head through opening of pull over shirt/dress;Thread/unthread right sleeve of pullover shirt/dresss Upper body dressing/undressing: 4: Min-Patient completed 75 plus % of tasks FIM - Lower Body Dressing/Undressing Lower body dressing/undressing steps patient completed: Thread/unthread right pants leg;Thread/unthread left pants leg;Pull pants up/down;Don/Doff right sock;Don/Doff left sock Lower body dressing/undressing: 4: Min-Patient completed 75 plus % of tasks  FIM - Toileting Toileting steps completed by patient: Performs perineal hygiene;Adjust clothing after toileting Toileting Assistive Devices: Grab bar or rail for support Toileting: 3: Mod-Patient completed 2 of 3 steps  FIM - Diplomatic Services operational officer Devices: Grab bars Toilet Transfers: 3-To toilet/BSC: Mod A (lift or lower assist);3-From toilet/BSC: Mod A (lift or lower assist)  FIM - Bed/Chair Transfer Bed/Chair Transfer Assistive Devices: Bed rails Bed/Chair Transfer: 3: Chair or W/C > Bed: Mod A (lift or lower assist);3: Bed > Chair or W/C: Mod A (lift or lower assist)  FIM - Locomotion: Wheelchair Distance: 150 Locomotion: Wheelchair: 4: Travels 150 ft or more: maneuvers on rugs and over door sillls with minimal assistance (Pt.>75%) FIM - Locomotion: Ambulation Locomotion: Ambulation Assistive Devices: Designer, industrial/product Ambulation/Gait Assistance: 3: Mod assist Locomotion: Ambulation: 1: Travels less than 50 ft with moderate assistance (Pt: 50 - 74%)  Comprehension Comprehension Mode: Auditory Comprehension: 5-Understands complex 90% of the time/Cues < 10% of the time  Expression Expression Mode: Verbal Expression: 5-Expresses basic needs/ideas: With no assist  Social Interaction Social Interaction: 6-Interacts appropriately with others with medication or extra time (anti-anxiety, antidepressant).  Problem Solving Problem Solving: 5-Solves basic problems: With no  assist  Memory Memory: 6-More than reasonable amt of time  Medical Problem List and Plan:  1. DVT Prophylaxis/Anticoagulation: Pharmaceutical: Lovenox  2. Pain Management: Will resume norco prn for headaches.  3. Depression: . Will resume Effexor and klonopin as at home. Team to continue to provide Ego support and encouragement. Will have LCSW and psychologist follow for support.  4. Neuropsych: This patient is capable of making decisions on her own behalf.  5. HTN: will monitor with bid checks.  . Titrate medications as needed for better control. Occ BP elevation , mild tachy on metoprolol at home 25mg ---some tachy and HTN recently--will push to 25mg  bid and see how she responds 6. Dyslipidemia: Continue Lipitor.  7. CAD: Will continue lipitor and ASA. Monitor for symptoms with activity.  8. Dysphagia: Needs full supervision at meals. Will continue Modified diet per ST recommendations   LOS (Days) 17 A FACE TO FACE EVALUATION WAS PERFORMED  KIRSTEINS,ANDREW E 11/06/2012, 9:08 AM

## 2012-11-06 NOTE — Progress Notes (Signed)
Speech Language Pathology Daily Session Note  Patient Details  Name: Tiffany [name] MRN: [id number] Date of Birth: 05-Mar-1951  Today's Date: 11/06/2012 Time: 1445-1530 Time Calculation (min): 45 min  Short Term Goals: Week 3: SLP Short Term Goal 1 (Week 3): Pt will follow multi-step commands with Supervision verbal and question cues with 90% accuracy.  SLP Short Term Goal 2 (Week 3): Pt will utilize word finding strategies in conversatoin with Supervison verbal cues  SLP Short Term Goal 3 (Week 3): Patient will self-monitor and correct naming errors with Supervision level verbal cues  SLP Short Term Goal 4 (Week 3): Patient will generate phrase level written expression with Supervision level verbal cues for correct spelling SLP Short Term Goal 5 (Week 3): Patient will utilize memory strategies with Modified Independence  Skilled Therapeutic Interventions: Skilled treatment session focused on addressing cognitive-linguistic goals.  Upon entering room patient was visiting with friend.  Patient was verbally communicating discharge with friend.  SLP facilitated discussion regarding therapy follow up location.  SLP also facilitated session with Supervision level verbal cues to utilize word retrieval strategies during a complex conversation regarding medications and job role.  Patient demonstrated increased ability to self-correct errors with increased wait time.   FIM:  Comprehension Comprehension Mode: Auditory Comprehension: 5-Understands complex 90% of the time/Cues < 10% of the time Expression Expression Mode: Verbal Expression: 5-Expresses complex 90% of the time/cues < 10% of the time Social Interaction Social Interaction: 6-Interacts appropriately with others with medication or extra time (anti-anxiety, antidepressant). Problem Solving Problem Solving: 5-Solves complex 90% of the time/cues < 10% of the time Memory Memory: 6-Assistive device: No helper FIM - Eating Eating Activity:  5: Set-up assist for open containers  Pain Pain Assessment Pain Assessment: No/denies pain  Therapy/Group: Individual Therapy  Charlane Ferretti., CCC-SLP [phone]  Tiffany [name] 11/06/2012, 4:04 PM

## 2012-11-07 ENCOUNTER — Inpatient Hospital Stay (HOSPITAL_COMMUNITY): Payer: BC Managed Care – PPO | Admitting: Occupational Therapy

## 2012-11-07 ENCOUNTER — Inpatient Hospital Stay (HOSPITAL_COMMUNITY): Payer: BC Managed Care – PPO | Admitting: Physical Therapy

## 2012-11-07 ENCOUNTER — Inpatient Hospital Stay (HOSPITAL_COMMUNITY): Payer: BC Managed Care – PPO | Admitting: Speech Pathology

## 2012-11-07 LAB — PLATELET INHIBITION P2Y12: Platelet Function  P2Y12: 150 [PRU] — ABNORMAL LOW (ref 194–418)

## 2012-11-07 MED ORDER — SODIUM CHLORIDE 0.9 % IV SOLN
Freq: Once | INTRAVENOUS | Status: AC
  Administered 2012-11-08: 23:00:00 via INTRAVENOUS

## 2012-11-07 MED ORDER — CEFAZOLIN SODIUM-DEXTROSE 2-3 GM-% IV SOLR
2.0000 g | Freq: Once | INTRAVENOUS | Status: AC
Start: 2012-11-09 — End: 2012-11-09
  Administered 2012-11-09: 2 g via INTRAVENOUS
  Filled 2012-11-07: qty 50

## 2012-11-07 NOTE — Progress Notes (Signed)
Patient ID: Tiffany [name], female   DOB: 1950-09-04, 62 y.o.   MRN: [id number]   Pt is scheduled for intervention in IR for 5/22 am  Pt and dtr and husband all aware of procedure risks and benefits and agreeable to proceed. Orders in chart

## 2012-11-07 NOTE — Progress Notes (Signed)
Physical Therapy Session Note  Patient Details  Name: Tiffany [name] MRN: [id number] Date of Birth: May 31, 1951  Today's Date: 11/07/2012 Time: 1102-1206 and 4098-1191 Time Calculation (min): 64 min and 45 min  Short Term Goals: Week 3:  PT Short Term Goal 1 (Week 3): = LTG  Skilled Therapeutic Interventions/Progress Updates:   Patient performed transfer stand pivot mat <> w/c with min A to stand and to assist with placement of RUE in hand orthosis and min A to pivot safely with RW to L and R and verbal cues for attention to placement and advancement of RLE while pivoting.  Patient still unable to maintain RLE activation in static stance without UE support on RW while LUE is releasing RUE from orthosis.  On mat discussed bed set up at home; patient performed sit > supine on flat mat with supervision.  In supine verbalized and demonstrated to patient how to perform bridging, scooting to reposition self in bed and sequence for rolling and positioning of affected UE and LE in R and L sidelying.  Had patient verbalize and return demonstrate as if explaining to husband with extra time to verbalize complete thought.  Required min A overall for rolling and intermittent cues to attend to RUE positioning.  Supine > sit with min A with verbal cues for sequence from R side.  Returned to w/c stand pivot.  Continued conversation with family about equipment delivery and f/u therapy and discussed with patient and family ways for patient to continue physical activity after D/C apart from therapy.  At end of session Cardiologist discussed with patient that she is scheduled for stent placement on Thursday am and will D/C home on Friday.  Alerted team to D/C plan in order to complete all family education and graduation day during therapy tomorrow; also alerted SW to change so that equipment could be delivered by end of day tomorrow.   PM session: focus on gait training with Stanford Breed beginning in hallway with LUE support on  wall rail with visual and verbal cues for sequencing, lateral weight shifting and activation and sequencing of RLE during forwards and retro gait x 25' x 2 reps with min A overall.  Patient with good response to cue "put weight through your heel" and demonstrating improved RLE flexion and clearance during swing and improved RLE extension in stance.  Transitioned to RW x 50' in controlled environment with min-mod A with increased cues needed for L lateral weight shifting and verbal cues for initiation of R swing phase; patient with increased difficulty sequencing safe progression of RW and sequencing of gait.  During sitting rest break performed trunk control training with focus on R trunk activation during sit <> stand to maintain trunk in midline and increased activation of RLE.  Performed stair negotiation training up and down 3 stairs with LUE support on rail and min A with verbal cues for safe step to sequence.    Therapy Documentation Precautions:  Precautions Precautions: Fall Precaution Comments: right hemipareisis, motor apraxia Restrictions Weight Bearing Restrictions: No Pain: Pain Assessment Pain Assessment: No/denies pain  See FIM for current functional status  Therapy/Group: Individual Therapy  Edman Circle [facility] 11/07/2012, 12:38 PM

## 2012-11-07 NOTE — Consult Note (Signed)
PSYCHOSOCIAL NOTE - CONFIDENTIAL  [facility]   According to medical records, Mrs. Casasola was admitted to the rehab unit owing to functional deficits secondary to a left MCA embolism.  She reported experiencing stable, if not improving, cognitive difficulties since her stroke. Memory loss purportedly makes it hard for her to recall recent conversations. Diminished attention was also endorsed, as was slowed speed of thinking. Word finding problems exist and she is mildly dysarthric. Her articulation is also adversely impacted by slurring.   Mrs. Junio reported having a history of treatment for depression and anxiety via medications that she finds to be helpful. 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 She was very interested in participating in therapy and support groups upon discharge, and she also wished to undergo neuropsychological evaluation.   Mrs. Bartee was appropriately dressed for season and situation, and she appeared tidy and well-groomed. Normal posture was noted. She was friendly and rapport was adequately established. Her speech was mildly dysarthric and word finding problems were observed, though she was able to express ideas effectively. Her affect was flat and she appeared depressed. Attention and motivation were good. Suicidal/homicidal ideation, plan or intent was denied. No manic or hypomanic episodes were reported. The patient denied ever experiencing any auditory/visual hallucinations. No major behavioral or personality changes were endorsed.    Overall, Mrs. Tuley reports suffering from a number of cognitive and emotional symptoms secondary to the stroke she suffered. Given her upcoming discharge date, the following recommendations are tailored for her as an outpatient.     RECOMMENDATIONS  Recommendations for patient upon discharge:    Since emotional factors are adversely impacting the patient's daily life, continued follow-up regarding her psychopharmacological medications is warranted. Participation in psychotherapy and support groups may also be beneficial and is encouraged.    Please provide her with detailed information on services such as these near her home.    Complete a comprehensive neuropsychological evaluation as an outpatient. This can be done with me or another neuropsychologist.    Please provide her with information for setting up such an appointment.   Greater than 50% of this visit was spent educating the patient about the possible diagnosis, prognosis, management plan, and in coordination of care.   REFERRING DIAGNOSIS: Left MCA embolism   FINAL DIAGNOSES:  Left MCA embolism  Depressive disorder, NOS  Total professional time including medical record review and feedback equals 4 units (16109).   Debbe Mounts, Psy.D.  Clinical Neuropsychologist

## 2012-11-07 NOTE — Progress Notes (Signed)
Speech Language Pathology Daily Session Note  Patient Details  Name: Tiffany [name] MRN: [id number] Date of Birth: 03/24/1951  Today's Date: 11/07/2012 Time: 8295-6213 Time Calculation (min): 30 min  Short Term Goals: Week 3: SLP Short Term Goal 1 (Week 3): Pt will follow multi-step commands with Supervision verbal and question cues with 90% accuracy.  SLP Short Term Goal 2 (Week 3): Pt will utilize word finding strategies in conversatoin with Supervison verbal cues  SLP Short Term Goal 3 (Week 3): Patient will self-monitor and correct naming errors with Supervision level verbal cues  SLP Short Term Goal 4 (Week 3): Patient will generate phrase level written expression with Supervision level verbal cues for correct spelling SLP Short Term Goal 5 (Week 3): Patient will utilize memory strategies with Modified Independence  Skilled Therapeutic Interventions: Skilled treatment session focused on addressing cognitive-linguistic goals.  SLP facilitated session with Min assist verbal and visual cues to self-monitor and correct written errors while writing biographical information.  SLP educated patient on cuing hierarchy of tracing, copying and then self-generating accurate letters and numbers.  Task was more difficult for patient given that she is right handed using her left; however, SLP encouraged her to continue to address writing from the stand point of correct letter order and formation.    FIM:  Comprehension Comprehension Mode: Auditory Comprehension: 5-Understands complex 90% of the time/Cues < 10% of the time Expression Expression Mode: Verbal Expression: 5-Expresses complex 90% of the time/cues < 10% of the time Social Interaction Social Interaction: 6-Interacts appropriately with others with medication or extra time (anti-anxiety, antidepressant). Problem Solving Problem Solving: 5-Solves complex 90% of the time/cues < 10% of the time Memory Memory: 6-Assistive device: No  helper  Pain Pain Assessment Pain Assessment: No/denies pain  Therapy/Group: Individual Therapy  Charlane Ferretti., CCC-SLP [phone]  Tiffany [name] 11/07/2012, 1:49 PM

## 2012-11-07 NOTE — Progress Notes (Signed)
Occupational Therapy Session Note  Patient Details  Name: Tiffany [name] MRN: [id number] Date of Birth: Sep 26, 1950  Today's Date: 11/07/2012 Time: 4098-1191 Time Calculation (min): 60 min  Short Term Goals: Week 3:  OT Short Term Goal 1 (Week 3): Pt will complete toilet transfer wtith supervision OT Short Term Goal 2 (Week 3): Pt will complete LB dressing with supervision OT Short Term Goal 3 (Week 3): Pt will complete shower transfer with min assist OT Short Term Goal 4 (Week 3): Pt will demonstrate use of RUE with grooming tasks with gross assist and min cues.  Skilled Therapeutic Interventions/Progress Updates:    Pt seen for ADL retraining with focus on walk-in shower transfer, sit <> stand, and functional use of RUE during self-care tasks.  Pt completed stand pivot to shower chair in walk-in shower with min/steady assist and cues for UE placement to assist in weight shifting.  Discussed need for grab bar in shower to provide steady assist with transfer as well as when standing to wash buttocks.  Pt completed bathing at sit <> stand level with close supervision and use of grab bar for stability.  Provided pt with verbal cues to increase functional use of RUE with bathing.  Pt required no verbal cues for orientation and sequencing of shirt this session.  Min/steady assist in standing when pulling up pants and verbal cues to attempt to integrate RUE into pulling up pants.  Pt reports having more use of RUE this morning.  Hand over hand assist to maintain grasp on deodorant with Rt hand.  Therapy Documentation Precautions:  Precautions Precautions: Fall Precaution Comments: right hemipareisis, motor apraxia Restrictions Weight Bearing Restrictions: No Pain: Pain Assessment Pain Assessment: 0-10 Pain Score: 0-No pain Patients Stated Pain Goal: 0 Multiple Pain Sites: No  See FIM for current functional status  Therapy/Group: Individual Therapy  Leonette Monarch 11/07/2012, 12:28  PM

## 2012-11-07 NOTE — Plan of Care (Signed)
Problem: RH BLADDER ELIMINATION Goal: RH STG MANAGE BLADDER WITH ASSISTANCE STG Manage Bladder With mod Assistance  Outcome: Progressing Continent during the day with panties and using brief only at night for early am incontinence

## 2012-11-07 NOTE — Progress Notes (Signed)
Patient ID: Tiffany [name], female   DOB: 01/08/1951, 62 y.o.   MRN: [id number]  Subjective/Complaints: 62 y.o. RH- female with history of CAD, HTN, TIA event a month ago; who developed difficulty speaking with right sided weakness on am of 10/16/12. CCT with bilateral subcortical hypodensities and patient treated with TPA. Carotid dopplers with <80% right ICA stenosis. 2 D echo with EF 60-65% and grade one dysfunction. Around midnight on 04/29 patient with progression of symptoms with dense right hemiparesis with inability to follow commands. Follow up CCT with developing focus of low attenuation left insular cortex and deep left frontal white matter, no hemorrhage. MRI brain done revealing L-MCA territory infarct affecting insula, operculum, left motor strip, superior parietal lobe, and left splenium. MRA brain with LMCA occlusion in M1 segment with no distal flow signal. 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    Objective: Vital Signs: Blood pressure 121/76, pulse 106, temperature 97.7 F (36.5 C), temperature source Oral, resp. rate 19, height 5' 7" (1.702 m), weight 75.569 kg (166 lb 9.6 oz), SpO2 92.00%. No results found. No results found for this or any previous visit (from the past 72 hour(s)).   HEENT: normal Cardio: RRR and no murmur Resp: CTA B/L and unlabored GI: BS positive and non tender Extremity:  Pulses positive and No Edema Skin:   Intact Neuro: Flat, Cranial Nerve Abnormalities R central 7, Abnormal Sensory difficult to assess secondary to speech, Abnormal Motor 2-/5 in RUE, 3-/5 RLE, 5/5 on Left side, Abnormal FMC Ataxic/ dec FMC and Aphasic Musc/Skel:  Normal Gen NAD.  Names 4/4 simple objects   Assessment/Plan: 1. 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 Physiatrist is providing close team supervision and 24 hour management of active medical problems listed below. Physiatrist and rehab team continue to assess barriers to discharge/monitor patient progress toward functional and medical goals. FIM: FIM - Bathing Bathing Steps Patient Completed: Chest;Right Arm;Left Arm;Abdomen Bathing: 5: Supervision: Safety issues/verbal cues  FIM - Upper Body Dressing/Undressing Upper body dressing/undressing steps patient completed: Thread/unthread right sleeve of pullover shirt/dresss;Thread/unthread left sleeve of pullover shirt/dress;Put head through opening of pull over shirt/dress;Pull shirt over trunk Upper body dressing/undressing: 5: Set-up assist to: Obtain clothing/put away FIM - Lower Body Dressing/Undressing Lower body dressing/undressing steps patient completed: Thread/unthread right pants leg;Thread/unthread left pants leg;Pull pants up/down;Don/Doff right sock;Don/Doff left sock;Don/Doff right shoe;Don/Doff left shoe;Fasten/unfasten right shoe;Fasten/unfasten left shoe Lower body dressing/undressing: 4: Min-Patient completed 75 plus % of tasks  FIM - Toileting Toileting steps completed by patient: Performs perineal hygiene;Adjust clothing after toileting Toileting Assistive Devices: Grab bar or rail for support Toileting: 3: Mod-Patient completed 2 of 3 steps  FIM - Toilet Transfers Toilet Transfers Assistive Devices: Grab bars Toilet Transfers: 3-To toilet/BSC: Mod A (lift or lower assist);3-From toilet/BSC: Mod A (lift or lower assist)  FIM - Bed/Chair Transfer Bed/Chair Transfer Assistive Devices: Bed rails Bed/Chair Transfer: 4: Bed > Chair or W/C: Min A (steadying Pt. > 75%);4: Chair or W/C > Bed: Min A (steadying Pt. > 75%)  FIM - Locomotion: Wheelchair Distance: 150 Locomotion: Wheelchair: 0: Activity did not occur FIM - Locomotion: Ambulation Locomotion:  Ambulation Assistive Devices: Walker - Rolling Ambulation/Gait Assistance: 3: Mod assist Locomotion: Ambulation: 0: Activity   did not occur  Comprehension Comprehension Mode: Auditory Comprehension: 5-Understands complex 90% of the time/Cues < 10% of the time  Expression Expression Mode: Verbal Expression: 5-Expresses complex 90% of the time/cues < 10% of the time  Social Interaction Social Interaction: 6-Interacts appropriately with others with medication or extra time (anti-anxiety, antidepressant).  Problem Solving Problem Solving: 5-Solves basic problems: With no assist  Memory Memory: 6-More than reasonable amt of time  Medical Problem List and Plan:  1. DVT Prophylaxis/Anticoagulation: Pharmaceutical: Lovenox  2. Pain Management: Will resume norco prn for headaches.  3. Depression: . Will resume Effexor and klonopin as at home. Team to continue to provide Ego support and encouragement. Will have LCSW and psychologist follow for support.  4. Neuropsych: This patient is capable of making decisions on her own behalf.  5. HTN: will monitor with bid checks.  . Titrate medications as needed for better control. Occ BP elevation , mild tachy on metoprolol at home 25mg---some tachy and HTN recently--will push to 25mg bid and see how she responds, may need to increase dose in am if HR>100 6. Dyslipidemia: Continue Lipitor.  7. CAD: Will continue lipitor and ASA. Monitor for symptoms with activity.  8. Dysphagia: Needs full supervision at meals. Will continue Modified diet per ST recommendations   LOS (Days) 18 A FACE TO FACE EVALUATION WAS PERFORMED  Armanie Martine E 11/07/2012, 8:13 AM    

## 2012-11-07 NOTE — Progress Notes (Signed)
Orthopedic Tech Progress Note Patient Details:  Tiffany [name] 12-22-1950 [id number]  Patient ID: [name], female   DOB: 1950/08/25, 62 y.o.   MRN: [id number] Brace order completed by [facility]  Grayling Schranz 11/07/2012, 6:27 PM

## 2012-11-08 ENCOUNTER — Other Ambulatory Visit: Payer: Self-pay | Admitting: Radiology

## 2012-11-08 ENCOUNTER — Inpatient Hospital Stay (HOSPITAL_COMMUNITY): Payer: BC Managed Care – PPO | Admitting: Speech Pathology

## 2012-11-08 ENCOUNTER — Ambulatory Visit (HOSPITAL_COMMUNITY): Payer: BC Managed Care – PPO | Admitting: Physical Therapy

## 2012-11-08 ENCOUNTER — Inpatient Hospital Stay (HOSPITAL_COMMUNITY): Payer: BC Managed Care – PPO | Admitting: Physical Therapy

## 2012-11-08 ENCOUNTER — Inpatient Hospital Stay (HOSPITAL_COMMUNITY): Payer: BC Managed Care – PPO | Admitting: Occupational Therapy

## 2012-11-08 MED ORDER — BACLOFEN 5 MG HALF TABLET: 5.0000 mg | ORAL_TABLET | Freq: Every day | ORAL | Status: DC

## 2012-11-08 MED ORDER — SODIUM CHLORIDE 0.9 % IV SOLN: INTRAVENOUS | Status: DC

## 2012-11-08 MED ORDER — METOPROLOL TARTRATE 50 MG PO TABS: 25.0000 mg | ORAL_TABLET | Freq: Two times a day (BID) | ORAL | Status: DC

## 2012-11-08 MED ORDER — NICOTINE 14 MG/24HR TD PT24: 1.0000 | MEDICATED_PATCH | Freq: Every day | TRANSDERMAL | Status: DC

## 2012-11-08 MED ORDER — CLOPIDOGREL BISULFATE 75 MG PO TABS: 75.0000 mg | ORAL_TABLET | Freq: Every day | ORAL | Status: DC

## 2012-11-08 MED ORDER — TRAZODONE 25 MG HALF TABLET: 25.0000 mg | ORAL_TABLET | Freq: Every evening | ORAL | Status: DC | PRN

## 2012-11-08 NOTE — Progress Notes (Signed)
Inpatient RehabilitationTeam Conference and Plan of Care Update Date: 11/08/2012   Time: 10:57 AM    Patient Name: Tiffany [name]      Medical Record Number: [id number]  Date of Birth: January 28, 1951 Sex: Female         Room/Bed: 4151/4151-01 Payor Info: Payor: BLUE CROSS BLUE SHIELD / Plan: [facility] HEALTH PPO / Product Type: *No Product type* /    Admitting Diagnosis: CVA STENOSIS  Admit Date/Time:  10/20/2012  6:34 PM Admission Comments: No comment available   Primary Diagnosis:  CVA (cerebral infarction) Principal Problem: CVA (cerebral infarction)  Patient Active Problem List   Diagnosis Date Noted  . CVA (cerebral infarction) 10/23/2012  . Depression 10/23/2012  . Dyslipidemia 10/23/2012    Expected Discharge Date:  11/09/12 to acute for procedure  Team Members Present: Dr. Wynn Banker, Kriste Basque Dupree SW, Melanee Spry RN, [facility] Perkinson OT, Fae Pippin ST, [place] PT, Tora Duck PPS Coordinator, Rosalio Loud OT, Perlie Mayo PT, Wanda Plump PT, Bretta Bang Supervisor, Bayard Hugger Director, Gregor Hams.         Current Status/Progress Goal Weekly Team Focus  Medical   Will be discharging to interventional radiology on 522, for stent placement  Maintain medical stability  Discharge planning family training   Bowel/Bladder   continent of bowel, brief HS to help with urgency  continent of B&B  timed toilet Q2-3 while awake   Swallow/Nutrition/ Hydration   regular and thin   least restrictive p.o. intake   goals met   ADL's   supervision UB dressing (min with donning bra), min assist LB dressing, min assist shower and toilet transfers from w/c  supervision overall, downgraded toilet transfer and LB dressing to min assist  RUE NM re-ed, transfers, standing balance and tolerance, family education, D/C planning   Mobility   Min A bed mobility, basic transfers, gait short distances, stairs  Goals downgraded to min A overall  D/C planning and family education, gait,  stairs, RLE activation and balance   Communication   supervision-min assist   supervision-min assist   family education    Safety/Cognition/ Behavioral Observations  supervision   supervision   family educaiton    Pain   pt denies pain  pain <3  monitor and assess pain Qshift and prn   Skin   skin clean dry and intact; free from infection/breakdown  skin will remain free from infection/breakdown  assess and monitor skin Qshift       *See Care Plan and progress notes for long and short-term goals.  Barriers to Discharge: See above    Possible Resolutions to Barriers:  See above    Discharge Planning/Teaching Needs:  HOme with husband and duaghter to assist-home health to begin with then transition to OP.      Team Discussion:  Family ed completed  Revisions to Treatment Plan:  Txs have done d/c today   Continued Need for Acute Rehabilitation Level of Care: The patient requires daily medical management by a physician with specialized training in physical medicine and rehabilitation for the following conditions: Daily direction of a multidisciplinary physical rehabilitation program to ensure safe treatment while eliciting the highest outcome that is of practical value to the patient.: Yes Daily medical management of patient stability for increased activity during participation in an intensive rehabilitation regime.: Yes Daily analysis of laboratory values and/or radiology reports with any subsequent need for medication adjustment of medical intervention for : Neurological problems  Brock Ra 11/08/2012, 6:55 PM

## 2012-11-08 NOTE — H&P (Signed)
Chief Complaint: rt ica stenosis  HPI: Tiffany [name] is an 62 y.o. female female with history of CAD, HTN, TIA event a month ago; who developed difficulty speaking with right sided weakness on am of 10/16/12. CCT with bilateral subcortical hypodensities and patient treated with TPA. Carotid dopplers with <80% right ICA stenosis. 2 D echo with EF 60-65% and grade one dysfunction. Around midnight on 04/29 patient with progression of symptoms with dense right hemiparesis with inability to follow commands. Follow up CCT with developing focus of low attenuation left insular cortex and deep left frontal white matter, no hemorrhage. MRI brain done revealing L-MCA territory infarct affecting insula, operculum, left motor strip, superior parietal lobe, and left splenium. MRA brain with LMCA occlusion in M1 segment with no distal flow signal. 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 Corliss Skains evaluated the pt and her imaging at the time of her stroke and felt when she was clinically stable and on Plavix, he would poceed with Cerebral Angio and Rt ICA stent angioplasty. The pt was transfere to Rehab and has been on Plavix appropriately. She is now scheduled for the above procedure as she has completed her rehab stay. Chart, PMHx, meds reviewed.  Past Medical History:  Past Medical History  Diagnosis Date  . Coronary artery disease   . Hypertension   . TIA (transient ischemic attack)   . High cholesterol   . MI (myocardial infarction)   . Stroke   . Arthritis     Past Surgical History:  Past Surgical History  Procedure Laterality Date  . Cardiac surgery    . Tee without cardioversion N/A 10/18/2012    Procedure: TRANSESOPHAGEAL ECHOCARDIOGRAM (TEE);  Surgeon: Vesta Mixer, MD;  Location: [facility] ENDOSCOPY;  Service: Cardiovascular;  Laterality: N/A;  . Coronary artery bypass graft      Family History: History reviewed. No pertinent family history.  Social History:  reports that she has  been smoking.  She does not have any smokeless tobacco history on file. She reports that she does not drink alcohol or use illicit drugs.  Allergies:  Allergies  Allergen Reactions  . Codeine Nausea Only    Medications: Current facility-administered medications:[START ON 11/09/2012] 0.9 %  sodium chloride infusion, , Intravenous, Once, Robet Leu, PA-C;  acetaminophen (TYLENOL) tablet 650 mg, 650 mg, Oral, Q6H PRN, Jacquelynn Cree, PA-C, 650 mg at 11/03/12 1135;  alum & mag hydroxide-simeth (MAALOX/MYLANTA) 200-200-20 MG/5ML suspension 30 mL, 30 mL, Oral, Q4H PRN, Jacquelynn Cree, PA-C antiseptic oral rinse (BIOTENE) solution 15 mL, 15 mL, Mouth Rinse, BID, Pamela S Love, PA-C, 15 mL at 11/08/12 0800;  aspirin tablet 325 mg, 325 mg, Oral, Daily, Erick Colace, MD, 325 mg at 11/08/12 0981;  atorvastatin (LIPITOR) tablet 40 mg, 40 mg, Oral, q1800, Jacquelynn Cree, PA-C, 40 mg at 11/08/12 1829;  baclofen (LIORESAL) tablet 5 mg, 5 mg, Oral, QHS, Pamela S Love, PA-C, 5 mg at 11/08/12 1827 bisacodyl (DULCOLAX) suppository 10 mg, 10 mg, Rectal, Daily PRN, Jacquelynn Cree, PA-C, 10 mg at 11/08/12 1914;  [START ON 11/09/2012] ceFAZolin (ANCEF) IVPB 2 g/50 mL premix, 2 g, Intravenous, Once, Robet Leu, PA-C;  clonazePAM Scarlette Calico) tablet 0.25 mg, 0.25 mg, Oral, BID, Pamela S Love, PA-C, 0.25 mg at 11/08/12 1349;  clonazePAM (KLONOPIN) tablet 0.5 mg, 0.5 mg, Oral, QHS, Pamela S Love, PA-C, 0.5 mg at 11/08/12 2104 clopidogrel (PLAVIX) tablet 75 mg, 75 mg, Oral, Q breakfast, Jacquelynn Cree, PA-C,  75 mg at 11/08/12 7846;  diphenhydrAMINE (BENADRYL) 12.5 MG/5ML elixir 12.5-25 mg, 12.5-25 mg, Oral, Q6H PRN, Evlyn Kanner Love, PA-C;  enoxaparin (LOVENOX) injection 40 mg, 40 mg, Subcutaneous, Q24H, Pamela S Love, PA-C, 40 mg at 11/08/12 1000;  feeding supplement (ENSURE) pudding 1 Container, 1 Container, Oral, TID BM, Ashley Jacobs, RD, 1 Container at 11/06/12 1438 fluticasone (FLONASE) 50 MCG/ACT nasal spray 1 spray,  1 spray, Each Nare, Daily PRN, Evlyn Kanner Love, PA-C;  guaiFENesin-dextromethorphan (ROBITUSSIN DM) 100-10 MG/5ML syrup 5-10 mL, 5-10 mL, Oral, Q6H PRN, Evlyn Kanner Love, PA-C;  HYDROcodone-acetaminophen (NORCO/VICODIN) 5-325 MG per tablet 1 tablet, 1 tablet, Oral, Q6H PRN, Jacquelynn Cree, PA-C, 1 tablet at 10/30/12 1223 loratadine (CLARITIN) tablet 10 mg, 10 mg, Oral, Daily, Erick Colace, MD, 10 mg at 11/08/12 9629;  metoprolol tartrate (LOPRESSOR) tablet 25 mg, 25 mg, Oral, BID, Ranelle Oyster, MD, 25 mg at 11/08/12 2103;  nicotine (NICODERM CQ - dosed in mg/24 hours) patch 14 mg, 14 mg, Transdermal, Daily, Jacquelynn Cree, PA-C, 14 mg at 11/08/12 5284;  ondansetron (ZOFRAN) injection 4 mg, 4 mg, Intravenous, Q6H PRN, Evlyn Kanner Love, PA-C polyethylene glycol (MIRALAX / GLYCOLAX) packet 17 g, 17 g, Oral, Daily PRN, Evlyn Kanner Love, PA-C;  prochlorperazine (COMPAZINE) injection 5-10 mg, 5-10 mg, Intramuscular, Q6H PRN, Evlyn Kanner Love, PA-C;  prochlorperazine (COMPAZINE) suppository 12.5 mg, 12.5 mg, Rectal, Q6H PRN, Evlyn Kanner Love, PA-C;  prochlorperazine (COMPAZINE) tablet 5-10 mg, 5-10 mg, Oral, Q6H PRN, Evlyn Kanner Love, PA-C traZODone (DESYREL) tablet 25-50 mg, 25-50 mg, Oral, QHS PRN, Jacquelynn Cree, PA-C, 50 mg at 11/08/12 2211;  venlafaxine [facility]) tablet 25 mg, 25 mg, Oral, TID WC, Evlyn Kanner Love, PA-C, 25 mg at 11/08/12 1828  Please HPI for pertinent positives, otherwise complete 10 system ROS negative.  Physical Exam: BP 112/76  Pulse 92  Temp(Src) 97.6 F (36.4 C) (Oral)  Resp 17  Ht 5\' 7"  (1.702 m)  Wt 169 lb 12.1 oz (77 kg)  BMI 26.58 kg/m2  SpO2 94% Body mass index is 26.58 kg/(m^2).   General Appearance:  Alert, cooperative, no distress, appears stated age  Head:  Normocephalic, without obvious abnormality, atraumatic  ENT: Unremarkable  Neck: Supple, symmetrical, trachea midline  Lungs:   Clear to auscultation bilaterally, no w/r/r, respirations unlabored without use of accessory  muscles.  Chest Wall:  No tenderness or deformity  Heart:  Regular rate and rhythm, S1, S2 normal, no murmur, rub or gallop.  Abdomen:   Soft, non-tender, non distended.  Extremities: Extremities normal, atraumatic, no cyanosis or edema  Pulses: 2+ and symmetric  Neurologic: Sl aphasic affect. Decreased motor strength Rt upper and lower ext.   Results for orders placed during the hospital encounter of 10/20/12 (from the past 48 hour(s))  PLATELET INHIBITION P2Y12     Status: Abnormal   Collection Time    11/07/12  4:15 PM      Result Value Range   Platelet Function  P2Y12 150 (*) 194 - 418 PRU   Comment:            The literature has shown a direct     correlation of PRU values over     230 with higher risks of     thrombotic events.  Lower PRU     values are associated with     platelet inhibition.     Performed by [facility]   No results found.  Assessment/Plan Severe  Rt ICA stenosis, >80% S/p recent CVA, stable in Rehab, on Plavix Discussed plan for cerebral angio with intention for stent angioplasty of rt ica Explained procedure, risks, complications, need for general anesthesia. Labs pending in am. Last P2Y12  150 Consent obtained NPO p MN. Anesthesia to see preop in am.   Brayton El PA-C 11/08/2012, 10:11 PM

## 2012-11-08 NOTE — Progress Notes (Signed)
Physical Therapy Discharge Summary  Patient Details  Name: Tiffany [name] MRN: [id number] Date of Birth: Dec 09, 1950  Today's Date: 11/08/2012 Time: 4098-1191 Time Calculation (min): 48 min  Patient has met 8 of 8 long term goals due to improved activity tolerance, improved balance, improved postural control, increased strength, ability to compensate for deficits, functional use of  right upper extremity and right lower extremity and improved coordination.  Patient to discharge at a wheelchair level Min Assist.   Patient's care partner is independent to provide the necessary physical assistance at discharge.  Reasons goals not met: NA  Recommendation:  Patient will benefit from ongoing skilled PT services in home health setting to continue to advance safe functional mobility, address ongoing impairments in balance, strength right leg and arm, coordination, transfers, safety, gait, and minimize fall risk.  Equipment: transport chair, hand splint for RW, right AFO  Reasons for discharge: treatment goals met and discharge from hospital  Patient/family agrees with progress made and goals achieved: Yes  PT Discharge Precautions/Restrictions Precautions Precautions: Fall Precaution Comments: right hemipareisis, motor apraxia   Vision/Perception  Vision - History Baseline Vision: Wears glasses only for reading Vision - Assessment Eye Alignment: Within Functional Limits Ocular Range of Motion: Within Functional Limits Perception Perception: Impaired Inattention/Neglect: Does not attend to right visual field;Does not attend to right side of body Praxis Praxis: Intact  Cognition Arousal/Alertness: Awake/alert Orientation Level: Oriented X4 Sensation Sensation Light Touch: Impaired Detail Light Touch Impaired Details: Impaired RUE;Impaired RLE Additional Comments: mildly impaired when tested to light touch.  Repsonses were not specifiic and she said "maybe" frequently when  responding to sensation questions.   Motor  Motor Motor: Hemiplegia  Mobility Bed Mobility Rolling Right: 5: Supervision Rolling Left: 5: Supervision Supine to Sit: 5: Supervision Sitting - Scoot to Edge of Bed: 5: Supervision Sit to Supine: 5: Supervision Sit to Supine - Details (indicate cue type and reason): extra time and verbal cues needed for all bed mobility.   Transfers Sit to Stand: 4: Min assist;With armrests;With upper extremity assist;From bed;From chair/3-in-1 Stand to Sit: 4: Min assist;Without upper extremity assist;With armrests;To bed;To chair/3-in-1 Stand Pivot Transfers: 4: Min assist;With armrests Stand Pivot Transfer Details (indicate cue type and reason): min assist family demonstrated ability to transfer her.   Locomotion  Ambulation Ambulation: Yes Ambulation/Gait Assistance: 4: Min assist Ambulation Distance (Feet): 55 Feet Assistive device: Rolling walker;Other (Comment) (right hand splint) Ambulation/Gait Assistance Details: min assist for weight shift of hips to left, right foot clearance and balance.  Verbal cues for safe use of RW.   Gait Gait Pattern: Step-to pattern;Decreased dorsiflexion - right;Right steppage;Shuffle;Trunk flexed Stairs / Additional Locomotion Stairs: Yes Stairs Assistance: 4: Min assist Stairs Assistance Details (indicate cue type and reason): min assist to steady pt for balance and verbal cues for safest leg sequencing.  Step daughter and husband demonstrated safety with guarding pt on the stairs.   Stair Management Technique: One rail Right;One rail Left;Step to pattern;Forwards Number of Stairs: 8 Height of Stairs: 6.5 Wheelchair Mobility Wheelchair Mobility: Yes Wheelchair Assistance: 4: Administrator, sports Details: Verbal cues for safe use of DME/AE;Verbal cues for precautions/safety;Verbal cues for technique Wheelchair Propulsion: Left upper extremity;Left lower extremity Wheelchair Parts Management: Needs  assistance Distance: 50     Balance Static Sitting Balance Static Sitting - Balance Support: Left upper extremity supported;Feet supported Static Sitting - Level of Assistance: 6: Modified independent (Device/Increase time) Dynamic Sitting Balance Dynamic Sitting - Balance Support: Left upper extremity supported;Feet supported  Dynamic Sitting - Level of Assistance: 6: Modified independent (Device/Increase time) Static Standing Balance Static Standing - Balance Support: Bilateral upper extremity supported Static Standing - Level of Assistance: 4: Min assist Static Standing - Comment/# of Minutes: with RW and hand splint right Extremity Assessment      RLE Assessment RLE Assessment: Exceptions to [facility] RLE Strength RLE Overall Strength Comments: ankle DF 2-/5, PF 2/5, knee extension 3-/5, knee flexion 3+/5, hip flexion 3+/5 LLE Assessment LLE Assessment: Within Functional Limits   Skilled Interventions: Session #1: This session focused on family education of bed mobility and positioning, transfers, stairs.  Family present and able to demonstrate proficiency helping pt.   Session #2: This session focused on gait with RW min assist 55'.  Step ups on 4" step with right railing mod assist for right leg strengthening.  Nu Step 10 min level 3 assist needed for facilitation of correct right leg motion while on NuStep.   See FIM for current functional status  Lurena Joiner B. Daylene Vandenbosch, PT, DPT [phone]   11/08/2012, 4:13 PM

## 2012-11-08 NOTE — Progress Notes (Signed)
NUTRITION FOLLOW UP  Intervention:    Continue Ensure pudding TID.  Continue Heart Healthy diet.  Nutrition Dx:   No nutrition diagnosis at this time.  Goal:   Intake to meet >90% of estimated nutrition needs. Met.  Monitor:   PO intake, weight trend.  Assessment:   Patient continues on a heart healthy diet. Is receiving medications with Ensure Pudding. Meal completion is 50-100%. Current intake with meals and supplements is adequate to meet nutrition needs. Plans for stent placement in IR 5/22, then discharge home 5/23.  Height: Ht Readings from Last 1 Encounters:  11/01/12 5\' 7"  (1.702 m)    Weight Status:   Wt Readings from Last 1 Encounters:  11/08/12 169 lb 12.1 oz (77 kg)  11/01/12  166 lb 9.6 oz (75.569 kg) 10/20/12  180 lb 12.4 oz (82 kg)    Body mass index is 26.58 kg/(m^2). overweight  Re-estimated needs:  Kcal: 1800-2000  Protein: 80-95g  Fluid: >1.8 L/day  Skin: no problems noted  Diet Order: Cardiac    Intake/Output Summary (Last 24 hours) at 11/08/12 1405 Last data filed at 11/08/12 1300  Gross per 24 hour  Intake    720 ml  Output      0 ml  Net    720 ml    Last BM: 5/21   Labs:   Recent Labs Lab 11/03/12 0530  CREATININE 0.73    CBG (last 3)  No results found for this basename: GLUCAP,  in the last 72 hours  Scheduled Meds: . [START ON 11/09/2012] sodium chloride   Intravenous Once  . antiseptic oral rinse  15 mL Mouth Rinse BID  . aspirin  325 mg Oral Daily  . atorvastatin  40 mg Oral q1800  . baclofen  5 mg Oral QHS  . [START ON 11/09/2012]  ceFAZolin (ANCEF) IV  2 g Intravenous Once  . clonazePAM  0.25 mg Oral BID  . clonazePAM  0.5 mg Oral QHS  . clopidogrel  75 mg Oral Q breakfast  . enoxaparin (LOVENOX) injection  40 mg Subcutaneous Q24H  . feeding supplement  1 Container Oral TID BM  . loratadine  10 mg Oral Daily  . metoprolol tartrate  25 mg Oral BID  . nicotine  14 mg Transdermal Daily  . venlafaxine  25 mg  Oral TID WC    Continuous Infusions: None  Joaquin Courts, RD, LDN, CNSC Pager [phone] After Hours Pager [phone]

## 2012-11-08 NOTE — Progress Notes (Signed)
Occupational Therapy Discharge Summary  Patient Details  Name: Tiffany [name] MRN: [id number] Date of Birth: 03-14-1951  Today's Date: 11/08/2012  Patient has met 9 of 10 long term goals due to improved activity tolerance, improved balance, postural control, ability to compensate for deficits, functional use of  RIGHT upper and RIGHT lower extremity and improved awareness.  Patient to discharge at [facility] Assist level.  Patient's care partner is independent to provide the necessary physical and cognitive assistance at discharge.  Patient's daughter and husband report assisting pt prior to CVA with mobility and transfers and have both return demonstrated safety with simple stand pivot transfers.  Reasons goals not met: Pt did not meet supervision toileting goal - pt continues to require min assist sit <> stand for hygiene and clothing management and occasionally requires physical assistance for pulling up pants.  Pt's family reports ability to assist with this task.  Recommendation:  Patient will benefit from ongoing skilled OT services in home health setting to continue to advance functional skills in the area of BADL and Reduce care partner burden.  Equipment: No equipment provided  Reasons for discharge: treatment goals met  Patient/family agrees with progress made and goals achieved: Yes  OT Discharge Precautions/Restrictions  Precautions Precautions: Fall Precaution Comments: right hemipareisis, motor apraxia Pain Pain Assessment Pain Assessment: No/denies pain Pain Score: 0-No pain Patients Stated Pain Goal: 0 Multiple Pain Sites: No ADL ADL Grooming: Setup;Supervision/safety Where Assessed-Grooming: Sitting at sink Upper Body Bathing: Supervision/safety Where Assessed-Upper Body Bathing: Shower Lower Body Bathing: Supervision/safety Where Assessed-Lower Body Bathing: Shower Upper Body Dressing: Supervision/safety Where Assessed-Upper Body Dressing: Sitting at  sink Lower Body Dressing: Minimal assistance Where Assessed-Lower Body Dressing: Sitting at sink;Standing at sink Toileting: Moderate assistance Where Assessed-Toileting: Toilet;Bedside Commode Toilet Transfer: Minimal assistance Toilet Transfer Method: Stand pivot Toilet Transfer Equipment: Bedside commode Tub/Shower Transfer: Minimal assistance Tub/Shower Transfer Method: Stand pivot Tub/Shower Equipment: Transfer tub bench;Grab bars;Walk in shower Walk-In Shower Transfer: Minimal assistance Film/video editor Method: Warden/ranger: Transfer tub bench;Grab bars Vision/Perception  Vision - History Baseline Vision: Wears glasses only for reading Vision - Assessment Eye Alignment: Within Functional Limits Ocular Range of Motion: Within Functional Limits Perception Perception: Impaired Inattention/Neglect: Does not attend to right visual field;Does not attend to right side of body Praxis Praxis: Intact  Cognition Overall Cognitive Status: Impaired/Different from baseline Arousal/Alertness: Awake/alert Orientation Level: Oriented X4 Sensation Sensation Light Touch: Impaired Detail Light Touch Impaired Details: Impaired RUE;Impaired RLE Additional Comments: mildly impaired when tested to light touch.  Repsonses were not specifiic and she said "maybe" frequently when responding to sensation questions.   Coordination Gross Motor Movements are Fluid and Coordinated: No Fine Motor Movements are Fluid and Coordinated: No Motor  Motor Motor: Hemiplegia Extremity/Trunk Assessment RUE Assessment RUE Assessment: Exceptions to [facility] RUE Tone RUE Tone Comments: decreased sustained grasp, increased tone in elbow and shoulder LUE Assessment LUE Assessment: Within Functional Limits  See FIM for current functional status  Leonette Monarch 11/08/2012, 11:25 AM

## 2012-11-08 NOTE — Progress Notes (Signed)
Recreational Therapy Session Note  Patient Details  Name: Tiffany [name] MRN: [id number] Date of Birth: 02-02-1951 Today's Date: 11/08/2012  Pt participated in animal assisted activity/therapy with supervision.  Kalab Camps 11/08/2012, 4:51 PM

## 2012-11-08 NOTE — Progress Notes (Signed)
Social Work Discharge Note Discharge Note  The overall goal for the admission was met for:   Discharge location: Yes-HOME WITH HUSBAND AND DAUGHTER TO ASSIST  Length of Stay: Yes-20 DAYS  Discharge activity level: Yes-SUPERVISION/MIN LEVEL  Home/community participation: Yes  Services provided included: MD, RD, PT, OT, SLP, RN, Pharmacy, Neuropsych and SW  Financial Services: Other: [employer]  Follow-up services arranged: Home Health: [facility]-PT,OT,SPT,RN, DME: [facility]-TRANSPORT CHAIR and Patient/Family request agency HH: [facility]ED BEFORE, DME: [facility]  Comments (or additional information):FAMILY EDUCATION COMPLETED COMFORTABLE WITH CARE-STENT 5/22 THEN D/C HOME 5/23  Patient/Family verbalized understanding of follow-up arrangements: Yes  Individual responsible for coordination of the follow-up plan: ROZA-DAUGHTER & SELF  Confirmed correct DME delivered: Lucy Chris 11/08/2012    Lucy Chris

## 2012-11-08 NOTE — Progress Notes (Signed)
Speech Language Pathology Daily Session Note & Discharge Summary   Patient Details  Name: Carle Dargan MRN: [id number] Date of Birth: [birth year]  Today's Date: 11/08/2012 Time: 1139-1205 Time Calculation (min): 26 min  Short Term Goals: Week 3: SLP Short Term Goal 1 (Week 3): Pt will follow multi-step commands with Supervision verbal and question cues with 90% accuracy.  SLP Short Term Goal 2 (Week 3): Pt will utilize word finding strategies in conversatoin with Supervison verbal cues  SLP Short Term Goal 3 (Week 3): Patient will self-monitor and correct naming errors with Supervision level verbal cues  SLP Short Term Goal 4 (Week 3): Patient will generate phrase level written expression with Supervision level verbal cues for correct spelling SLP Short Term Goal 5 (Week 3): Patient will utilize memory strategies with Modified Independence  Skilled Therapeutic Interventions: Skilled treatment session focused on addressing cognitive-linguistic goals.  SLP facilitated session with Min assist verbal and visual cues to self-monitor and correct typed errors on computer.  SLP educated patient and family on cuing hierarchy of tracing, copying and then self-generating accurate letters and numbers and typing is just as difficult as writing due to language/symbol impairment.  SLP also demonstrated semantic and phonemic cues for word finding difficulty.  Patient and family verbalized understanding of information.    FIM:  Comprehension Comprehension Mode: Auditory Comprehension: 5-Understands complex 90% of the time/Cues < 10% of the time Expression Expression Mode: Verbal Expression: 5-Expresses complex 90% of the time/cues < 10% of the time Social Interaction Social Interaction: 6-Interacts appropriately with others with medication or extra time (anti-anxiety, antidepressant). Problem Solving Problem Solving: 5-Solves complex 90% of the time/cues < 10% of the time Memory Memory: 6-More than  reasonable amt of time FIM - Eating Eating Activity: 5: Set-up assist for open containers  Pain Pain Assessment Pain Assessment: No/denies pain  Therapy/Group: Individual Therapy   Speech Language Pathology Discharge Summary  Patient Details  Name: Inita Uram MRN: [id number] Date of Birth: Feb 18, 1951  Today's Date: 11/08/2012  Patient has met 5 of 5 long term goals.  Patient to discharge at overall Modified Independent;Supervision;Min level.  Reasons goals not met: n/a   Clinical Impression/Discharge Summary: Patient met 5 out of 5 long term goals during CIR stay due to gains in swallow function (curently tolerating regular and thin Mod I), verbal and receptive expressive abilities as well as increased awarenss, self-monitoring and correcting of errors.  Patient continues to require Supervision-Min assist cues to use word finding strategies and self-monitor and correct errors.  Patient also requires Min assist for awareness of spelling errors while self-generating biographical information.  Patient has progressed from Mod assist to Supervision-Min assist; however, she would continue to benenft from skilled SLP services to maximize functional independence and reduce buden of care.   Care Partner:  Caregiver Able to Provide Assistance: Yes  Type of Caregiver Assistance: Cognitive;Physical  Recommendation:  Outpatient SLP;24 hour supervision/assistance;Home Health SLP  Rationale for SLP Follow Up: Maximize functional communication;Maximize cognitive function and independence;Reduce caregiver burden   Equipment: none   Reasons for discharge: Treatment goals met;Discharged from hospital   Patient/Family Agrees with Progress Made and Goals Achieved: Yes   See FIM for current functional status  Charlane Ferretti., CCC-SLP [phone]  Jonnell Hentges 11/08/2012, 4:36 PM

## 2012-11-08 NOTE — Progress Notes (Signed)
Occupational Therapy Session Note  Patient Details  Name: Tiffany [name] MRN: [id number] Date of Birth: 1950/10/29  Today's Date: 11/08/2012 Time: 9562-1308 Time Calculation (min): 61 min  Short Term Goals: Week 3:  OT Short Term Goal 1 (Week 3): Pt will complete toilet transfer wtith supervision OT Short Term Goal 2 (Week 3): Pt will complete LB dressing with supervision OT Short Term Goal 3 (Week 3): Pt will complete shower transfer with min assist OT Short Term Goal 4 (Week 3): Pt will demonstrate use of RUE with grooming tasks with gross assist and min cues.  Skilled Therapeutic Interventions/Progress Updates:    Pt seen for ADL retraining and hands on family education with transfers and self-care tasks.  Pt had completed shower yesterday and requested to complete simple wash up at sink this session.  Educated pt's daughter and husband on verbal cues to provide to assist pt in dressing tasks and min assist with LB dressing secondary to decreased sit to stand and occasional Rt lean due to Rt knee buckle.  Pt required increased time to don shirt this session but was able to correct error with only one cue.  Pt's husband and daughter were instructed in stand pivot transfers bed <> BSC <> w/c.  Min verbal cues provided to husband to correct his hand placement and foot placement to increase safety with transfer as well as cues and timing to increase pt's participation in transfer.  Pt's daughter completed transfer without requiring any additional cues.  Discussed stand pivot transfers as primary transfer at this time until pt's ambulation improves, due to her fluctuations in tolerance and RLE weakness, until further improvement and education with HHOT/HHPT.  Pt's daughter and husband report understanding of education and report no further questions at this time.  Therapy Documentation Precautions:  Precautions Precautions: Fall Precaution Comments: right hemipareisis, motor  apraxia Restrictions Weight Bearing Restrictions: No Pain: Pain Assessment Pain Assessment: No/denies pain Pain Score: 0-No pain ADL: ADL Grooming: Setup;Supervision/safety Where Assessed-Grooming: Sitting at sink Upper Body Bathing: Supervision/safety Where Assessed-Upper Body Bathing: Shower Lower Body Bathing: Supervision/safety Where Assessed-Lower Body Bathing: Shower Upper Body Dressing: Supervision/safety Where Assessed-Upper Body Dressing: Sitting at sink Lower Body Dressing: Minimal assistance Where Assessed-Lower Body Dressing: Sitting at sink;Standing at sink Toileting: Moderate assistance Where Assessed-Toileting: Toilet;Bedside Commode Toilet Transfer: Minimal assistance Toilet Transfer Method: Stand pivot Toilet Transfer Equipment: Bedside commode Tub/Shower Transfer: Minimal assistance Tub/Shower Transfer Method: Stand pivot Tub/Shower Equipment: Transfer tub bench;Grab bars;Walk in shower Walk-In Shower Transfer: Minimal assistance Film/video editor Method: Warden/ranger: Transfer tub bench;Grab bars  See FIM for current functional status  Therapy/Group: Individual Therapy  Leonette Monarch 11/08/2012, 12:27 PM

## 2012-11-08 NOTE — Progress Notes (Signed)
Patient ID: Tiffany [name], female   DOB: 1951-03-21, 62 y.o.   MRN: [id number]  Subjective/Complaints: 62 y.o. RH- female with history of CAD, HTN, TIA event a month ago; who developed difficulty speaking with right sided weakness on am of 10/16/12. CCT with bilateral subcortical hypodensities and patient treated with TPA. Carotid dopplers with <80% right ICA stenosis. 2 D echo with EF 60-65% and grade one dysfunction. Around midnight on 04/29 patient with progression of symptoms with dense right hemiparesis with inability to follow commands. Follow up CCT with developing focus of low attenuation left insular cortex and deep left frontal white matter, no hemorrhage. MRI brain done revealing L-MCA territory infarct affecting insula, operculum, left motor strip, superior parietal lobe, and left splenium. MRA brain with LMCA occlusion in M1 segment with no distal flow signal. 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    Objective: Vital Signs: Blood pressure 121/76, pulse 106, temperature 97.7 F (36.5 C), temperature source Oral, resp. rate 19, height 5\' 7"  (1.702 m), weight 75.569 kg (166 lb 9.6 oz), SpO2 92.00%. No results found. No results found for this or any previous visit (from the past 72 hour(s)).   HEENT: normal Cardio: RRR and no murmur Resp: CTA B/L and unlabored GI: BS positive and non tender Extremity:  Pulses positive and No Edema Skin:   Intact Neuro: Flat, Cranial Nerve Abnormalities R central 7, Abnormal Sensory difficult to assess secondary to speech, Abnormal Motor 2-/5 in RUE, 3-/5 RLE, 5/5 on Left side, Abnormal FMC Ataxic/ dec FMC and Aphasic Musc/Skel:  Normal Gen NAD.  Names 4/4 simple objects   Assessment/Plan: 1. 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 Physiatrist is providing close team supervision and 24 hour management of active medical problems listed below. Physiatrist and rehab team continue to assess barriers to discharge/monitor patient progress toward functional and medical goals. FIM: FIM - Bathing Bathing Steps Patient Completed: Chest;Right Arm;Left Arm;Abdomen Bathing: 5: Supervision: Safety issues/verbal cues  FIM - Upper Body Dressing/Undressing Upper body dressing/undressing steps patient completed: Thread/unthread right sleeve of pullover shirt/dresss;Thread/unthread left sleeve of pullover shirt/dress;Put head through opening of pull over shirt/dress;Pull shirt over trunk Upper body dressing/undressing: 5: Set-up assist to: Obtain clothing/put away FIM - Lower Body Dressing/Undressing Lower body dressing/undressing steps patient completed: Thread/unthread right pants leg;Thread/unthread left pants leg;Pull pants up/down;Don/Doff right sock;Don/Doff left sock;Don/Doff right shoe;Don/Doff left shoe;Fasten/unfasten right shoe;Fasten/unfasten left shoe Lower body dressing/undressing: 4: Min-Patient completed 75 plus % of tasks  FIM - Toileting Toileting steps completed by patient: Performs perineal hygiene;Adjust clothing after toileting Toileting Assistive Devices: Grab bar or rail for support Toileting: 3: Mod-Patient completed 2 of 3 steps  FIM - Diplomatic Services operational officer Devices: Grab bars Toilet Transfers: 3-To toilet/BSC: Mod A (lift or lower assist);3-From toilet/BSC: Mod A (lift or lower assist)  FIM - Bed/Chair Transfer Bed/Chair Transfer Assistive Devices: Bed rails Bed/Chair Transfer: 4: Bed > Chair or W/C: Min A (steadying Pt. > 75%);4: Chair or W/C > Bed: Min A (steadying Pt. > 75%)  FIM - Locomotion: Wheelchair Distance: 150 Locomotion: Wheelchair: 0: Activity did not occur FIM - Locomotion: Ambulation Locomotion:  Ambulation Assistive Devices: Designer, industrial/product Ambulation/Gait Assistance: 3: Mod assist Locomotion: Ambulation: 0: Activity  did not occur  Comprehension Comprehension Mode: Auditory Comprehension: 5-Understands complex 90% of the time/Cues < 10% of the time  Expression Expression Mode: Verbal Expression: 5-Expresses complex 90% of the time/cues < 10% of the time  Social Interaction Social Interaction: 6-Interacts appropriately with others with medication or extra time (anti-anxiety, antidepressant).  Problem Solving Problem Solving: 5-Solves basic problems: With no assist  Memory Memory: 6-More than reasonable amt of time  Medical Problem List and Plan:  1. DVT Prophylaxis/Anticoagulation: Pharmaceutical: Lovenox  2. Pain Management: Will resume norco prn for headaches.  3. Depression: . Will resume Effexor and klonopin as at home. Team to continue to provide Ego support and encouragement. Will have LCSW and psychologist follow for support.  4. Neuropsych: This patient is capable of making decisions on her own behalf.  5. HTN: will monitor with bid checks.  . Titrate medications as needed for better control. Occ BP elevation , mild tachy on metoprolol at home 25mg ---some tachy and HTN recently--will push to 25mg  bid and see how she responds, may need to increase dose in am if HR>100 6. Dyslipidemia: Continue Lipitor.  7. CAD: Will continue lipitor and ASA. Monitor for symptoms with activity.  8. Dysphagia: Needs full supervision at meals. Will continue Modified diet per ST recommendations   LOS (Days) 18 A FACE TO FACE EVALUATION WAS PERFORMED  Yaqub Arney E 11/07/2012, 8:13 AM

## 2012-11-08 NOTE — Progress Notes (Signed)
Recreational Therapy Discharge Summary Patient Details  Name: Tiffany [name] MRN: [id number] Date of Birth: 1950-07-30 Today's Date: 11/08/2012  Long term goals set: 1  Long term goals met: 1  Comments on progress toward goals: Pt met supervision level for TR tasks seated, but requires min assist for standing activities.  Pt is discharging home at overall min assist w/c level and pt's family will be providing 24 hour supervision/assist.   Discussion with pt about importance of staying active at discharge as it was one of her family's concerns, pt stated understanding. Reasons for discharge: discharge from hospital  Patient/family agrees with progress made and goals achieved: Yes  [name] 11/08/2012, 5:11 PM

## 2012-11-08 NOTE — Progress Notes (Signed)
Social Work Patient ID: Tiffany [name], female   DOB: 04/12/1951, 62 y.o.   MRN: [id number] Met with pt, husband and daughter to inform team conference progression toward goals and discharge tomorrow.  Pt to go to surgery tomorrow for stent then home 5/23. Have arranged Home health via [facility] health and daughter to go to [facility] to pick up transport chair.  Pt pleased with progress and ready for discharge.

## 2012-11-09 ENCOUNTER — Encounter (HOSPITAL_COMMUNITY): Payer: Self-pay | Admitting: Certified Registered Nurse Anesthetist

## 2012-11-09 ENCOUNTER — Inpatient Hospital Stay (HOSPITAL_COMMUNITY): Payer: BC Managed Care – PPO | Admitting: Anesthesiology

## 2012-11-09 ENCOUNTER — Encounter (HOSPITAL_COMMUNITY): Payer: Self-pay | Admitting: Anesthesiology

## 2012-11-09 ENCOUNTER — Encounter (HOSPITAL_COMMUNITY): Payer: Self-pay

## 2012-11-09 ENCOUNTER — Encounter (HOSPITAL_COMMUNITY)
Admission: AD | Disposition: A | Discharge status: Home / Self Care | Payer: Self-pay | Source: Ambulatory Visit | Attending: Interventional Radiology

## 2012-11-09 ENCOUNTER — Other Ambulatory Visit (HOSPITAL_COMMUNITY): Payer: Self-pay | Admitting: Radiology

## 2012-11-09 ENCOUNTER — Inpatient Hospital Stay (HOSPITAL_COMMUNITY)
Admission: AD | Admit: 2012-11-09 | Discharge: 2012-11-10 | DRG: 838 | Disposition: A | Discharge status: Home / Self Care | Payer: BC Managed Care – PPO | Source: Ambulatory Visit | Attending: Interventional Radiology | Admitting: Interventional Radiology

## 2012-11-09 VITALS — BP 102/78 | HR 93 | Temp 99.0°F | Resp 22 | Ht 66.0 in | Wt 180.8 lb

## 2012-11-09 DIAGNOSIS — M62838 Other muscle spasm: Secondary | ICD-10-CM | POA: Diagnosis not present

## 2012-11-09 DIAGNOSIS — I69922 Dysarthria following unspecified cerebrovascular disease: Secondary | ICD-10-CM

## 2012-11-09 DIAGNOSIS — I6521 Occlusion and stenosis of right carotid artery: Secondary | ICD-10-CM

## 2012-11-09 DIAGNOSIS — R29898 Other symptoms and signs involving the musculoskeletal system: Secondary | ICD-10-CM | POA: Diagnosis present

## 2012-11-09 DIAGNOSIS — I63239 Cerebral infarction due to unspecified occlusion or stenosis of unspecified carotid arteries: Secondary | ICD-10-CM | POA: Diagnosis present

## 2012-11-09 DIAGNOSIS — I10 Essential (primary) hypertension: Secondary | ICD-10-CM | POA: Diagnosis present

## 2012-11-09 DIAGNOSIS — I6529 Occlusion and stenosis of unspecified carotid artery: Principal | ICD-10-CM | POA: Diagnosis present

## 2012-11-09 DIAGNOSIS — I658 Occlusion and stenosis of other precerebral arteries: Secondary | ICD-10-CM | POA: Diagnosis present

## 2012-11-09 DIAGNOSIS — I639 Cerebral infarction, unspecified: Secondary | ICD-10-CM | POA: Diagnosis present

## 2012-11-09 DIAGNOSIS — F411 Generalized anxiety disorder: Secondary | ICD-10-CM | POA: Diagnosis present

## 2012-11-09 DIAGNOSIS — I69992 Facial weakness following unspecified cerebrovascular disease: Secondary | ICD-10-CM

## 2012-11-09 DIAGNOSIS — Z7982 Long term (current) use of aspirin: Secondary | ICD-10-CM

## 2012-11-09 HISTORY — PX: RADIOLOGY WITH ANESTHESIA: SHX6223

## 2012-11-09 LAB — CBC WITH DIFFERENTIAL/PLATELET
Basophils Relative: 1 % (ref 0–1)
Eosinophils Absolute: 0.4 10*3/uL (ref 0.0–0.7)
Eosinophils Relative: 4 % (ref 0–5)
MCH: 32.1 pg (ref 26.0–34.0)
MCHC: 35.1 g/dL (ref 30.0–36.0)
Monocytes Relative: 14 % — ABNORMAL HIGH (ref 3–12)
Neutrophils Relative %: 41 % — ABNORMAL LOW (ref 43–77)
Platelets: 293 10*3/uL (ref 150–400)

## 2012-11-09 LAB — HEPARIN LEVEL (UNFRACTIONATED): Heparin Unfractionated: <0.1 IU/mL — ABNORMAL LOW (ref 0.30–0.70)

## 2012-11-09 LAB — PROTIME-INR: INR: 0.92 (ref 0.00–1.49)

## 2012-11-09 LAB — COMPREHENSIVE METABOLIC PANEL
Albumin: 3.2 g/dL — ABNORMAL LOW (ref 3.5–5.2)
BUN: 18 mg/dL (ref 6–23)
Chloride: 102 mEq/L (ref 96–112)
Creatinine, Ser: 0.68 mg/dL (ref 0.50–1.10)
GFR calc Af Amer: >90 mL/min (ref >90)
GFR calc non Af Amer: >90 mL/min (ref >90)
Glucose, Bld: 98 mg/dL (ref 70–99)
Total Bilirubin: 0.2 mg/dL — ABNORMAL LOW (ref 0.3–1.2)

## 2012-11-09 LAB — POCT ACTIVATED CLOTTING TIME: Activated Clotting Time: 187 seconds

## 2012-11-09 SURGERY — RADIOLOGY WITH ANESTHESIA
Anesthesia: General

## 2012-11-09 MED ORDER — NITROGLYCERIN 1 MG/10 ML FOR IR/CATH LAB
INTRA_ARTERIAL | Status: AC
  Filled 2012-11-09: qty 10

## 2012-11-09 MED ORDER — LACTATED RINGERS IV SOLN
INTRAVENOUS | Status: DC | PRN
  Administered 2012-11-09 (×2): via INTRAVENOUS

## 2012-11-09 MED ORDER — ACETAMINOPHEN 650 MG RE SUPP: 650.0000 mg | Freq: Four times a day (QID) | RECTAL | Status: DC | PRN

## 2012-11-09 MED ORDER — HEPARIN (PORCINE) IN NACL 100-0.45 UNIT/ML-% IJ SOLN: 500.0000 [IU]/h | INTRAMUSCULAR | Status: DC

## 2012-11-09 MED ORDER — SODIUM CHLORIDE 0.9 % IV SOLN
INTRAVENOUS | Status: AC
  Administered 2012-11-09: 14:00:00 via INTRAVENOUS

## 2012-11-09 MED ORDER — PHENYLEPHRINE HCL 10 MG/ML IJ SOLN
30.0000 ug/min | INTRAMUSCULAR | Status: AC
  Administered 2012-11-09: 125 ug/min via INTRAVENOUS
  Filled 2012-11-09: qty 4

## 2012-11-09 MED ORDER — LABETALOL HCL 5 MG/ML IV SOLN
INTRAVENOUS | Status: DC | PRN
  Administered 2012-11-09: 5 mg via INTRAVENOUS

## 2012-11-09 MED ORDER — EPTIFIBATIDE 2 MG/ML IV SOLN
INTRAVENOUS | Status: AC
  Filled 2012-11-09: qty 10

## 2012-11-09 MED ORDER — CLOPIDOGREL BISULFATE 75 MG PO TABS
75.0000 mg | ORAL_TABLET | Freq: Every day | ORAL | Status: DC
  Administered 2012-11-10: 75 mg via ORAL
  Filled 2012-11-09 (×2): qty 1

## 2012-11-09 MED ORDER — EPHEDRINE SULFATE 50 MG/ML IJ SOLN
INTRAMUSCULAR | Status: DC | PRN
  Administered 2012-11-09: 15 mg via INTRAVENOUS
  Administered 2012-11-09: 20 mg via INTRAVENOUS
  Administered 2012-11-09: 5 mg via INTRAVENOUS

## 2012-11-09 MED ORDER — IOHEXOL 300 MG/ML  SOLN
150.0000 mL | Freq: Once | INTRAMUSCULAR | Status: AC | PRN
  Administered 2012-11-09: 130 mL via INTRA_ARTERIAL

## 2012-11-09 MED ORDER — NICARDIPINE HCL IN NACL 20-0.86 MG/200ML-% IV SOLN: 5.0000 mg/h | INTRAVENOUS | Status: DC

## 2012-11-09 MED ORDER — ASPIRIN 325 MG PO TABS
325.0000 mg | ORAL_TABLET | Freq: Every day | ORAL | Status: DC
  Administered 2012-11-10: 325 mg via ORAL
  Filled 2012-11-09 (×2): qty 1

## 2012-11-09 MED ORDER — ACETAMINOPHEN 500 MG PO TABS: 1000.0000 mg | ORAL_TABLET | Freq: Four times a day (QID) | ORAL | Status: DC | PRN

## 2012-11-09 MED ORDER — FENTANYL CITRATE 0.05 MG/ML IJ SOLN
INTRAMUSCULAR | Status: DC | PRN
  Administered 2012-11-09: 25 ug via INTRAVENOUS
  Administered 2012-11-09: 50 ug via INTRAVENOUS
  Administered 2012-11-09 (×2): 25 ug via INTRAVENOUS

## 2012-11-09 MED ORDER — FENTANYL CITRATE 0.05 MG/ML IJ SOLN
INTRAMUSCULAR | Status: DC | PRN
  Administered 2012-11-09 (×2): 50 ug via INTRAVENOUS

## 2012-11-09 MED ORDER — NICARDIPINE HCL IN NACL 20-0.86 MG/200ML-% IV SOLN
5.0000 mg/h | INTRAVENOUS | Status: DC
  Filled 2012-11-09: qty 200

## 2012-11-09 MED ORDER — NICARDIPINE HCL IN NACL 20-0.86 MG/200ML-% IV SOLN
INTRAVENOUS | Status: DC | PRN
  Administered 2012-11-09: 2.5 mg/h via INTRAVENOUS

## 2012-11-09 MED ORDER — METOPROLOL TARTRATE 1 MG/ML IV SOLN
INTRAVENOUS | Status: DC | PRN
  Administered 2012-11-09 (×2): 2 mg via INTRAVENOUS
  Administered 2012-11-09: 1 mg via INTRAVENOUS

## 2012-11-09 MED ORDER — GLYCOPYRROLATE 0.2 MG/ML IJ SOLN
INTRAMUSCULAR | Status: DC | PRN
  Administered 2012-11-09: 0.2 mg via INTRAVENOUS

## 2012-11-09 MED ORDER — SODIUM CHLORIDE 0.9 % IV SOLN
10.0000 mg | INTRAVENOUS | Status: DC | PRN
  Administered 2012-11-09: 25 ug/min via INTRAVENOUS

## 2012-11-09 MED ORDER — PROPOFOL INFUSION 10 MG/ML OPTIME
INTRAVENOUS | Status: DC | PRN
  Administered 2012-11-09: 25 ug/kg/min via INTRAVENOUS

## 2012-11-09 MED ORDER — PHENYLEPHRINE HCL 10 MG/ML IJ SOLN
30.0000 ug/min | INTRAMUSCULAR | Status: DC
  Administered 2012-11-09: 100 ug/min via INTRAVENOUS
  Administered 2012-11-09: 30 ug/min via INTRAVENOUS
  Administered 2012-11-09: 150 ug/min via INTRAVENOUS
  Administered 2012-11-09: 125 ug/min via INTRAVENOUS
  Filled 2012-11-09 (×6): qty 1

## 2012-11-09 MED ORDER — HEPARIN (PORCINE) IN NACL 100-0.45 UNIT/ML-% IJ SOLN
850.0000 [IU]/h | INTRAMUSCULAR | Status: AC
  Filled 2012-11-09: qty 250

## 2012-11-09 MED ORDER — ATROPINE SULFATE 0.4 MG/ML IJ SOLN
INTRAMUSCULAR | Status: DC | PRN
  Administered 2012-11-09 (×2): .1 mg via INTRAVENOUS

## 2012-11-09 MED ORDER — FENTANYL CITRATE 0.05 MG/ML IJ SOLN: 25.0000 ug | INTRAMUSCULAR | Status: AC | PRN

## 2012-11-09 MED ORDER — ONDANSETRON HCL 4 MG/2ML IJ SOLN: 4.0000 mg | Freq: Four times a day (QID) | INTRAMUSCULAR | Status: DC | PRN

## 2012-11-09 MED ORDER — LACTATED RINGERS IV SOLN
INTRAVENOUS | Status: DC | PRN
  Administered 2012-11-09: 08:00:00 via INTRAVENOUS

## 2012-11-09 MED ORDER — PROPOFOL 10 MG/ML IV BOLUS
INTRAVENOUS | Status: DC | PRN
  Administered 2012-11-09: 20 mg via INTRAVENOUS
  Administered 2012-11-09: 40 mg via INTRAVENOUS
  Administered 2012-11-09: 20 mg via INTRAVENOUS
  Administered 2012-11-09: 10 mg via INTRAVENOUS
  Administered 2012-11-09 (×2): 20 mg via INTRAVENOUS

## 2012-11-09 MED ORDER — HEPARIN SODIUM (PORCINE) 1000 UNIT/ML IJ SOLN
INTRAMUSCULAR | Status: DC | PRN
  Administered 2012-11-09: 3000 [IU] via INTRAVENOUS
  Administered 2012-11-09: 500 [IU] via INTRAVENOUS

## 2012-11-09 MED ORDER — ONDANSETRON HCL 4 MG/2ML IJ SOLN
INTRAMUSCULAR | Status: DC | PRN
  Administered 2012-11-09: 4 mg via INTRAVENOUS

## 2012-11-09 NOTE — Transfer of Care (Signed)
Immediate Anesthesia Transfer of Care Note  Patient: Tiffany [name]  Procedure(s) Performed: Procedure(s): RADIOLOGY WITH ANESTHESIA- CAROTID STENT (N/A)  Patient Location: PACU  Anesthesia Type:MAC  Level of Consciousness: awake, alert , oriented and patient cooperative  Airway & Oxygen Therapy: Patient Spontanous Breathing and Patient connected to face mask oxygen  Post-op Assessment: Report given to PACU RN, Post -op Vital signs reviewed and stable, Patient moving all extremities X 4 and Patient able to stick tongue midline  Post vital signs: Reviewed and stable  Complications: No apparent anesthesia complications

## 2012-11-09 NOTE — Progress Notes (Signed)
Pt sent to surgery for stent placement and discharged from 4100.

## 2012-11-09 NOTE — Progress Notes (Signed)
  Subjective: Post procedure. Awake, alert . Denies any H/As ,N/V,visual ,motor,sensory or speech changes... Denies any chest pains or SOB. Howevere noted to have loew BPs with stimulation requiring the need for iv neosynephrine to maintain  SBP at 110 to 120 mmhg.  Objective: Vital signs in last 24 hours: Temp:  [97.7 F (36.5 C)-98.5 F (36.9 C)] 97.7 F (36.5 C) (05/22 1345) Pulse Rate:  [32-92] 32 (05/22 1400) Resp:  [14-18] 18 (05/22 1400) BP: (84-137)/(44-76) 84/44 mmHg (05/22 1400) SpO2:  [89 %-100 %] 100 % (05/22 1400) Arterial Line BP: (72-110)/(39-60) 72/39 mmHg (05/22 1400)    Intake/Output from previous day:   Intake/Output this shift: Total I/O In: 2100 [I.V.:2100] Out: 350 [Urine:300; Blood:50]  On examination.. In no acute distress. BP 90s to 110 mmh on vasopressors. HR 70s to 90s SR. PaO2 >95% on 2l O2 via [place].  Neurologically. Alert, awake oriented to time ,place and space.Marland Kitchen  Speech dysarthric, due to recent stroke. and comprehension clear.Marland Kitchen  PEARLA 3mm RT = Lt...  EOMS full.Tiffany [name].grossly intact   Mild RT facial droop unchanged..  Tongue Midline..  Motor..           NO drift of outstretched  Left arm. RT UE 3/5 prox and 3-/5 distally with hand grip... RT LE prox 4/5,and distally 4/5. Lt LE 5/5 prox and distally.         Fine motor and coordination to finger to nose sig reduced on rt because of the previou stroke.  Gait Not tested.  Romberg. Not tested.  Heel to toe Not tested..   Lab Results:   Recent Labs  11/09/12 0150  WBC 11.2*  HGB 14.4  HCT 41.0  PLT 293   BMET  Recent Labs  11/09/12 0150  NA 141  K 4.0  CL 102  CO2 28  GLUCOSE 98  BUN 18  CREATININE 0.68  CALCIUM 9.3   PT/INR  Recent Labs  11/09/12 0150  LABPROT 12.3  INR 0.92   ABG No results found for this basename: PHART, PCO2, PO2, HCO3,  in the last 72 hours  Studies/Results: No results  found.  Anti-infectives: Anti-infectives   None      Assessment/Plan: S/P RT ICA stent assisted angioplasty with distal protection.. Plan.  1.Continue IV heparin with close monitoring of BP and neurologically. 2.IV neo for BP elevation. 3.At 600pm ,elevate head to25 degrees advance to liquid diet as tolerated starting with water juice,jello,pudding yogurt.. 4.D/W patient and family s/pDEVESHWAR,Tiffany [name] K 11/09/2012

## 2012-11-09 NOTE — Progress Notes (Signed)
Pt is awake, alert, no c/o. Dr Corliss Skains updated. Heparin drip was begun at 1220 @ 500 units/hr.  It was increased to 700 units/hr at 1254 by order of pharmacist Wes. Right groin remains level0, uo is clear yellow. 3100 RN Baird Lyons given report at bedside. NEO drip cont to titrate to keep SBP around 120.

## 2012-11-09 NOTE — Progress Notes (Signed)
ANTICOAGULATION CONSULT NOTE - Follow Up Consult  Pharmacy Consult for Heparin Indication: s/p carotid stent placement  Allergies  Allergen Reactions  . Codeine Nausea Only   Patient Measurements: Height: 5\' 6"  (167.6 cm) Weight: 180 lb 12.4 oz (82 kg) IBW/kg (Calculated) : 59.3 Heparin Dosing Weight: 77 kg  Labs:  Recent Labs  11/09/12 0150 11/09/12 1850  HGB 14.4  --   HCT 41.0  --   PLT 293  --   APTT 26  --   LABPROT 12.3  --   INR 0.92  --   HEPARINUNFRC  --  <0.10*  CREATININE 0.68  --     Estimated Creatinine Clearance: 77.7 ml/min (by C-G formula based on Cr of 0.68).  Medications:  Infusions:  . sodium chloride 10 mL/hr at 11/09/12 2000  . heparin 700 Units/hr (11/09/12 2000)  . phenylephrine (NEO-SYNEPHRINE) Adult infusion 150 mcg/min (11/09/12 2024)   Assessment: 62 y/o female on heparin s/p carotid stent placement. Heparin level is subtherapeutic at <0.1 on 700 units/hr. No bleeding noted.  Goal of Therapy:  Heparin level 0.1-0.25 units/ml Monitor platelets by anticoagulation protocol: Yes   Plan:  -Increase heparin drip to 850 units/hr -Heparin to be turned off at 07:00 tomorrow morning  [facility], [address].D., BCPS Clinical Pharmacist Pager: [phone] 11/09/2012 9:19 PM

## 2012-11-09 NOTE — Anesthesia Preprocedure Evaluation (Addendum)
Anesthesia Evaluation  Patient identified by MRN, date of birth, ID band Patient awake    Reviewed: Allergy & Precautions, H&P , NPO status , Patient's Chart, lab work & pertinent test results  Airway Mallampati: I TM Distance: >3 FB Neck ROM: Full    Dental  (+) Poor Dentition and Missing,    Pulmonary COPDCurrent Smoker,  + rhonchi         Cardiovascular hypertension, Pt. on home beta blockers + CAD and + Past MI Rhythm:Regular Rate:Normal     Neuro/Psych Depression R sided weakness, numbness, L facial droop, slurred speech TIACVA, Residual Symptoms    GI/Hepatic   Endo/Other    Renal/GU      Musculoskeletal   Abdominal   Peds  Hematology   Anesthesia Other Findings   Reproductive/Obstetrics                          Anesthesia Physical Anesthesia Plan  ASA: III  Anesthesia Plan: General   Post-op Pain Management:    Induction: Intravenous  Airway Management Planned: Oral ETT  Additional Equipment: Arterial line  Intra-op Plan:   Post-operative Plan: Extubation in OR  Informed Consent: I have reviewed the patients History and Physical, chart, labs and discussed the procedure including the risks, benefits and alternatives for the proposed anesthesia with the patient or authorized representative who has indicated his/her understanding and acceptance.     Plan Discussed with: CRNA and Surgeon  Anesthesia Plan Comments:         Anesthesia Quick Evaluation

## 2012-11-09 NOTE — Progress Notes (Signed)
ANTICOAGULATION CONSULT NOTE - Initial Consult  Pharmacy Consult for Heparin Indication: s/p carotid stent placement  Allergies  Allergen Reactions  . Codeine Nausea Only    Patient Measurements:   Heparin Dosing Weight: 77kg  Vital Signs: Temp: 98 F (36.7 C) (05/22 1227) Temp src: Oral (05/22 0605) BP: 137/75 mmHg (05/22 0605) Pulse Rate: 87 (05/22 0605)  Labs:  Recent Labs  11/09/12 0150  HGB 14.4  HCT 41.0  PLT 293  APTT 26  LABPROT 12.3  INR 0.92  CREATININE 0.68    The CrCl is unknown because both a height and weight (above a minimum accepted value) are required for this calculation.   Medical History: Past Medical History  Diagnosis Date  . Coronary artery disease   . Hypertension   . TIA (transient ischemic attack)   . High cholesterol   . MI (myocardial infarction)   . Stroke   . Arthritis     Medications:  Prescriptions prior to admission  Medication Sig Dispense Refill  . aspirin EC 325 MG tablet Take 325 mg by mouth daily.      Marland Kitchen atorvastatin (LIPITOR) 40 MG tablet Take 40 mg by mouth daily.      . baclofen (LIORESAL) 5 mg TABS Take 0.5 tablets (5 mg total) by mouth at bedtime.  30 tablet  1  . cetirizine (ZYRTEC) 10 MG tablet Take 10 mg by mouth daily.      . clonazePAM (KLONOPIN) 0.5 MG tablet Take 0.5 mg by mouth 3 (three) times daily as needed for anxiety.      . clopidogrel (PLAVIX) 75 MG tablet Take 1 tablet (75 mg total) by mouth daily with breakfast.  30 tablet  1  . HYDROcodone-acetaminophen (VICODIN) 5-500 MG per tablet Take 1 tablet by mouth every 8 (eight) hours as needed for pain.      . metoprolol (LOPRESSOR) 50 MG tablet Take 0.5 tablets (25 mg total) by mouth 2 (two) times daily.      . mometasone (NASONEX) 50 MCG/ACT nasal spray Place 2 sprays into the nose daily.      . nicotine (NICODERM CQ - DOSED IN MG/24 HOURS) 14 mg/24hr patch Place 1 patch onto the skin daily.  28 patch  0  . traZODone (DESYREL) 25 mg TABS Take 0.5-1  tablets (25-50 mg total) by mouth at bedtime as needed for sleep.  30 tablet  1  . venlafaxine (EFFEXOR) 25 MG tablet Take 25 mg by mouth 3 (three) times daily.        Assessment: 62yof on heparin s/p carotid stent placement. Noted plans to discontinue heparin at 0700 tomorrow for sheath removal. - Baseline labs: INR 0.92, Hg 14.4, Plts 293 - No significant bleeding reported  Goal of Therapy:  Heparin level 0.1-0.25 units/ml Monitor platelets by anticoagulation protocol: Yes   Plan:  1. Increase heparin drip to 700 units/hr (7 ml/hr) 2. Check heparin level 6 hours after initiation 3. Monitor for s/sx of bleeding  Cleon Dew [phone] 11/09/2012,12:41 PM

## 2012-11-09 NOTE — Anesthesia Postprocedure Evaluation (Signed)
  Anesthesia Post-op Note  Patient: Tiffany [name]  Procedure(s) Performed: Procedure(s): RADIOLOGY WITH ANESTHESIA- CAROTID STENT (N/A)  Patient Location: PACU  Anesthesia Type:MAC  Level of Consciousness: awake  Airway and Oxygen Therapy: Patient Spontanous Breathing  Post-op Pain: mild  Post-op Assessment: Post-op Vital signs reviewed, Patient's Cardiovascular Status Stable, Respiratory Function Stable, Patent Airway, No signs of Nausea or vomiting and Pain level controlled  Post-op Vital Signs: Reviewed and stable  Complications: No apparent anesthesia complications

## 2012-11-09 NOTE — Preoperative (Signed)
Beta Blockers   Reason not to administer Beta Blockers:Not Applicable  Pt received po lopressor 2103--5.21.14

## 2012-11-09 NOTE — Procedures (Signed)
S/P 4 vessel cerebral arteriogram followed by stent assisted angioplasty of RT ICA prox with distal protection.

## 2012-11-09 NOTE — Discharge Summary (Signed)
Physician Discharge Summary  Patient ID: Tiffany [name] MRN: [id number] DOB/AGE: Aug 07, 1950 62 y.o.  Admit date: 10/20/2012 Discharge date: 11/09/2012  Discharge Diagnoses:  Principal Problem:   CVA (cerebral infarction) Active Problems:   Depression   Dyslipidemia   Muscle spasms of lower extremity   Discharged Condition: Good   Labs:  Basic Metabolic Panel:  Recent Labs Lab 11/03/12 0530 11/09/12 0150  NA  --  141  K  --  4.0  CL  --  102  CO2  --  28  GLUCOSE  --  98  BUN  --  18  CREATININE 0.73 0.68  CALCIUM  --  9.3    CBC:  Recent Labs Lab 11/09/12 0150  WBC 11.2*  NEUTROABS 4.6  HGB 14.4  HCT 41.0  MCV 91.5  PLT 293    CBG: No results found for this basename: GLUCAP,  in the last 168 hours  Brief HPI:   Tiffany [name] is a 62 y.o. RH- female with history of CAD, HTN, TIA event a month ago; who developed difficulty speaking with right sided weakness on am of 10/16/12. She was treated with TPA. MRI brain done revealing L-MCA territory infarct affecting insula, operculum, left motor strip, superior parietal lobe, and left splenium. MRA brain with LMCA occlusion in M1 segment with no distal flow signal.  She did have worsening of symptoms past admission and follow up CCT without evidence of bleed. TEE was negative for PFO, ASD or thrombus. Patient was placed on ASA and plavix for embolic CVA and plans for angio with stent by IVR in the near future. Patient continued to be limited by aphasia with dysphagia, right sided weakness and therapy team recommended CIR.     Hospital Course: Tiffany [name] was admitted to rehab 10/20/2012 for inpatient therapies to consist of PT, ST and OT at least three hours five days a week. Past admission physiatrist, therapy team and rehab RN have worked together to provide customized collaborative inpatient rehab. Vital were monitored on bid basis and metoprolol was titrated to bid to help with better control.  She had high levels  of anxiety with lability initally. Ego support was provided by the team and klonopin was schedule tid to help with symptoms. Nocturnal spasms were controlled with use of low dose baclofen. Po intake has improved and she's continent of bowel and bladder. She has made good progress during her stay and home therapies to continue past discharge. She was evaluated by Dr. Corliss Skains for stent placement and was set up for procedure on 05/22 past completion of rehab program.    Rehab course: During patient's stay in rehab weekly team conferences were held to monitor patient's progress, set goals and discuss barriers to discharge. Speech therapy-focused on addressing cognitive-linguistic goals as well as dysphagia treatment. She has made gains in swallow function (curently tolerating regular and thin Mod I), verbal and receptive expressive abilities as well as increased awarenss, self-monitoring and correcting of errors. Patient continues to require Supervision-Min assist with  cues to use word finding strategies and self-monitor and correct errors. She requires Min assist for awareness of spelling errors while self-generating biographical information. She has progressed from Mod assist to Supervision-Min assist. Family was educated on providing semantic and phonemic cues for word finding difficulty as well as cueing for writing skills. Occupational therapy has worked on ADL tasks as well as neuro-re Furniture conservator/restorer.  Physical therapy has worked on mobility, activity tolerance, balance as well as  functional  use of right upper extremity and right lower extremity and improved coordination. Patient is at min assist for transfers and able to ambulate 55 feet with min assist, RW and R-AFO.   Disposition:  Acute Services   Diet: Heart Healthy   Special Instructions:  [facility] to provide home health PT, OT, SPT, RN.        Future Appointments Provider Department Dept Phone   12/01/2012 9:30 AM Erick Colace, MD [facility] [phone]       Medication List    STOP taking these medications       estrogen (conjugated)-medroxyprogesterone 0.625-2.5 MG per tablet  Commonly known as:  PREMPRO     nabumetone 750 MG tablet  Commonly known as:  RELAFEN     venlafaxine XR 150 MG 24 hr capsule  Commonly known as:  EFFEXOR-XR      TAKE these medications       aspirin EC 325 MG tablet  Take 325 mg by mouth daily.     atorvastatin 40 MG tablet  Commonly known as:  LIPITOR  Take 40 mg by mouth daily.     baclofen 5 mg Tabs  Commonly known as:  LIORESAL  Take 0.5 tablets (5 mg total) by mouth at bedtime.     cetirizine 10 MG tablet  Commonly known as:  ZYRTEC  Take 10 mg by mouth daily.     clonazePAM 0.5 MG tablet  Commonly known as:  KLONOPIN  Take 0.5 mg by mouth 3 (three) times daily as needed for anxiety.     clopidogrel 75 MG tablet  Commonly known as:  PLAVIX  Take 1 tablet (75 mg total) by mouth daily with breakfast.     HYDROcodone-acetaminophen 5-500 MG per tablet  Commonly known as:  VICODIN  Take 1 tablet by mouth every 8 (eight) hours as needed for pain.     metoprolol 50 MG tablet  Commonly known as:  LOPRESSOR  Take 0.5 tablets (25 mg total) by mouth 2 (two) times daily.     mometasone 50 MCG/ACT nasal spray  Commonly known as:  NASONEX  Place 2 sprays into the nose daily.     nicotine 14 mg/24hr patch  Commonly known as:  NICODERM CQ - dosed in mg/24 hours  Place 1 patch onto the skin daily.     traZODone 25 mg Tabs  Commonly known as:  DESYREL  Take 0.5-1 tablets (25-50 mg total) by mouth at bedtime as needed for sleep.     venlafaxine 25 MG tablet  Commonly known as:  EFFEXOR  Take 25 mg by mouth 3 (three) times daily.       Follow-up Information   Follow up with Erick Colace, MD On 12/01/2012. (Be there at 9:15 for 9;30 appointment. )    Contact information:   [address] [place]  [address] [phone]       Follow up with Gates Rigg, MD. Call today. (for follow up appointment  in 6 weeks. )    Contact information:   [address] [place] [address] [phone]       Follow up with [facility], MD.   Contact information:   [address] [place] [address] [phone]       Signed: Jacquelynn Cree 11/09/2012, 8:13 AM

## 2012-11-09 NOTE — Progress Notes (Signed)
UR complete 

## 2012-11-09 NOTE — Progress Notes (Signed)
Rated #2 in Rankin scale

## 2012-11-10 ENCOUNTER — Encounter (HOSPITAL_COMMUNITY): Payer: Self-pay | Admitting: Interventional Radiology

## 2012-11-10 DIAGNOSIS — I63239 Cerebral infarction due to unspecified occlusion or stenosis of unspecified carotid arteries: Secondary | ICD-10-CM | POA: Diagnosis present

## 2012-11-10 LAB — CBC WITH DIFFERENTIAL/PLATELET
Basophils Relative: 0 % (ref 0–1)
Hemoglobin: 10.4 g/dL — ABNORMAL LOW (ref 12.0–15.0)
MCHC: 33.8 g/dL (ref 30.0–36.0)
Monocytes Relative: 9 % (ref 3–12)
Neutro Abs: 8.1 10*3/uL — ABNORMAL HIGH (ref 1.7–7.7)
Neutrophils Relative %: 54 % (ref 43–77)
RBC: 3.39 MIL/uL — ABNORMAL LOW (ref 3.87–5.11)

## 2012-11-10 LAB — BASIC METABOLIC PANEL
BUN: 9 mg/dL (ref 6–23)
Chloride: 105 mEq/L (ref 96–112)
GFR calc Af Amer: >90 mL/min (ref >90)
Potassium: 4.2 mEq/L (ref 3.5–5.1)

## 2012-11-10 MED ORDER — HYDROCODONE-ACETAMINOPHEN 5-500 MG PO TABS: 1.0000 | ORAL_TABLET | Freq: Four times a day (QID) | ORAL | Status: DC | PRN

## 2012-11-10 MED ORDER — PHENYLEPHRINE HCL 10 MG/ML IJ SOLN
30.0000 ug/min | INTRAMUSCULAR | Status: DC
  Administered 2012-11-10: 140 ug/min via INTRAVENOUS
  Administered 2012-11-10: 90 ug/min via INTRAVENOUS
  Filled 2012-11-10 (×3): qty 4

## 2012-11-10 MED ORDER — SODIUM CHLORIDE 0.9 % IV BOLUS (SEPSIS)
250.0000 mL | Freq: Once | INTRAVENOUS | Status: AC
  Administered 2012-11-10: 16:00:00 via INTRAVENOUS
  Administered 2012-11-10: 250 mL via INTRAVENOUS

## 2012-11-10 MED ORDER — SODIUM CHLORIDE 0.9 % IV SOLN
30.0000 ug/min | INTRAVENOUS | Status: DC
  Filled 2012-11-10: qty 4

## 2012-11-10 MED ORDER — CLOPIDOGREL BISULFATE 75 MG PO TABS: 75.0000 mg | ORAL_TABLET | Freq: Every day | ORAL | Status: DC

## 2012-11-10 NOTE — Discharge Summary (Signed)
Physician Discharge Summary  Patient ID: Tiffany [name] MRN: [id number] DOB/AGE: [birth year] 62 y.o.  Admit date: 11/09/2012 Discharge date: 11/10/2012  Admission Diagnoses: Principal Problem:   Carotid stenosis, symptomatic, with infarction Active Problems:   CVA (cerebral infarction)  Discharge Diagnoses:  Principal Problem:   Carotid stenosis, symptomatic, with infarction Active Problems:   CVA (cerebral infarction)    Procedures: Procedure(s): RADIOLOGY WITH ANESTHESIA- RIGHT INTERNAL CAROTID STENT  Discharged Condition: good  Hospital Course: HPI: Tiffany [name] is an 62 y.o. female female with history of CAD, HTN, TIA event a month ago; who developed difficulty speaking with right sided weakness on am of 10/16/12. CCT with bilateral subcortical hypodensities and patient treated with TPA. Carotid dopplers with <80% right ICA stenosis. 2 D echo with EF 60-65% and grade one dysfunction. Around midnight on 04/29 patient with progression of symptoms with dense right hemiparesis with inability to follow commands. Follow up CCT with developing focus of low attenuation left insular cortex and deep left frontal white matter, no hemorrhage. MRI brain done revealing L-MCA territory infarct affecting insula, operculum, left motor strip, superior parietal lobe, and left splenium. MRA brain with LMCA occlusion in M1 segment with no distal flow signal. Neurology felt that the patient had an embolic CVA  Dr. Corliss Skains evaluated the pt and her imaging at the time of her stroke and felt when she was clinically stable and on Plavix, he would poceed with Cerebral Angio and Rt ICA stent angioplasty. The pt was transfere to Rehab and has been on Plavix appropriately.  She was scheduled for the above procedure as she has completed her rehab stay  Pt was brought to IR suite and under GET anesthesia, underwent successful Rt ICA stent angioplasty She was admitted to Neuro ICU in stable condition. She did  well post op. Her BP has maintained low and she has needed to be on Neo drip. She has a normal low BP and is asymptomatic. No other acute neuro issues. Sheath was pulled on POD #1 Foley was removed and pt voiding on own. Eventually weaned off Neo with stable BP  We reviewed her meds list, instructions and limitations. She has HHRN, PT, OT, ST all arranged to begin upon discharge. She will follow up in 2 weeks.  Consults: None   Discharge Exam: Blood pressure 87/46, pulse 62, temperature 99 F (37.2 C), temperature source Oral, resp. rate 19, height 5\' 6"  (1.676 m), weight 180 lb 12.4 oz (82 kg), SpO2 92.00%. Rt groin sheath intact, tissue soft, no hematoma.  Lungs: CTA  Heart: Reg  Neuro: alert and oriented  No drift  Is moving rt hand and leg as much as pre-op.  (L)UE strength and fine motor intact  Mild facial asymmetry, same os pre-op   Disposition: Home      Discharge Orders   Future Appointments Provider Department Dept Phone   12/01/2012 9:30 AM Erick Colace, MD [facility] [phone]   Future Orders Complete By Expires     Call MD for:  persistant dizziness or light-headedness  As directed     Call MD for:  persistant nausea and vomiting  As directed     Call MD for:  redness, tenderness, or signs of infection (pain, swelling, redness, odor or green/yellow discharge around incision site)  As directed     Call MD for:  severe uncontrolled pain  As directed     Call MD for:  temperature >100.4  As directed  Diet - low sodium heart healthy  As directed     Increase activity slowly  As directed     Comments:      As directed by PT/OT    Remove dressing in 24 hours  As directed         Medication List    TAKE these medications       aspirin EC 325 MG tablet  Take 325 mg by mouth daily.     atorvastatin 40 MG tablet  Commonly known as:  LIPITOR  Take 40 mg by mouth daily.     baclofen 5 mg Tabs  Commonly known as:  LIORESAL   Take 0.5 tablets (5 mg total) by mouth at bedtime.     cetirizine 10 MG tablet  Commonly known as:  ZYRTEC  Take 10 mg by mouth daily.     clonazePAM 0.5 MG tablet  Commonly known as:  KLONOPIN  Take 0.5 mg by mouth 3 (three) times daily as needed for anxiety.     clopidogrel 75 MG tablet  Commonly known as:  PLAVIX  Take 1 tablet (75 mg total) by mouth daily with breakfast.     clopidogrel 75 MG tablet  Commonly known as:  PLAVIX  Take 1 tablet (75 mg total) by mouth daily.     HYDROcodone-acetaminophen 5-500 MG per tablet  Commonly known as:  VICODIN  Take 1 tablet by mouth every 6 (six) hours as needed for pain.     metoprolol 50 MG tablet  Commonly known as:  LOPRESSOR  Take 0.5 tablets (25 mg total) by mouth 2 (two) times daily.     mometasone 50 MCG/ACT nasal spray  Commonly known as:  NASONEX  Place 2 sprays into the nose daily.     nicotine 14 mg/24hr patch  Commonly known as:  NICODERM CQ - dosed in mg/24 hours  Place 1 patch onto the skin daily.     traZODone 25 mg Tabs  Commonly known as:  DESYREL  Take 0.5-1 tablets (25-50 mg total) by mouth at bedtime as needed for sleep.     venlafaxine 25 MG tablet  Commonly known as:  EFFEXOR  Take 25 mg by mouth 3 (three) times daily.       Follow-up Information   Follow up with Oneal Grout, MD. Schedule an appointment as soon as possible for a visit in 2 weeks. (Office will call)    Contact information:   [facility] [phone]       Signed: Brayton El PA-C 11/10/2012, 2:59 PM

## 2012-11-10 NOTE — Progress Notes (Signed)
Here to remove R femoral sheath pt found sitting up in bed at 30 degrees, sheath removed intact with no kinks noted manual pressure held with v pad, hemostasis achieved

## 2012-11-10 NOTE — Progress Notes (Signed)
Review groin site with RN at [facility]

## 2012-11-10 NOTE — Progress Notes (Signed)
Dressing applied, site clean dry intact no hematoma, 10 pound sand bag applied, review bed rest limitations and groin precautions with pt, + PT and DP to R LL

## 2012-11-10 NOTE — Progress Notes (Signed)
Subjective: Pt in Neuro ICU. Feels ok with the exception of all the lines and restrictions. Denies HA, N/V  Objective: Physical Exam: BP 102/44  Pulse 63  Temp(Src) 99.5 F (37.5 C) (Oral)  Resp 17  Ht 5\' 6"  (1.676 m)  Wt 180 lb 12.4 oz (82 kg)  BMI 29.19 kg/m2  SpO2 97% Rt groin sheath intact, tissue soft, no hematoma. Lungs: CTA Heart: Reg Neuro: alert and oriented No drift Is moving rt hand and leg as much as pre-op. (L)UE strength and fine motor intact Mild facial asymmetry, same os pre-op.    Labs: CBC  Recent Labs  11/09/12 0150 11/10/12 0410  WBC 11.2* 15.0*  HGB 14.4 10.4*  HCT 41.0 31.1*  PLT 293 279   BMET  Recent Labs  11/09/12 0150 11/10/12 0410  NA 141 139  K 4.0 4.2  CL 102 105  CO2 28 27  GLUCOSE 98 111*  BUN 18 9  CREATININE 0.68 0.65  CALCIUM 9.3 8.8   LFT  Recent Labs  11/09/12 0150  PROT 6.2  ALBUMIN 3.2*  AST 15  ALT 19  ALKPHOS 105  BILITOT 0.2*   PT/INR  Recent Labs  11/09/12 0150  LABPROT 12.3  INR 0.92     Studies/Results: No results found.  Assessment/Plan: S/p RT ICA stent angioplasty DC aline DC sheath Eventual DC of foley and increase activity and wean neo once bedrest complete. Reporting to Dr. Corliss Skains     LOS: 1 day    Brayton El PA-C 11/10/2012 9:05 AM

## 2012-11-20 ENCOUNTER — Other Ambulatory Visit (HOSPITAL_COMMUNITY): Payer: Self-pay | Admitting: Interventional Radiology

## 2012-11-20 DIAGNOSIS — I6521 Occlusion and stenosis of right carotid artery: Secondary | ICD-10-CM

## 2012-11-24 ENCOUNTER — Ambulatory Visit (HOSPITAL_COMMUNITY)
Admission: RE | Admit: 2012-11-24 | Discharge: 2012-11-24 | Disposition: A | Discharge status: Home / Self Care | Payer: BC Managed Care – PPO | Source: Ambulatory Visit | Attending: Interventional Radiology | Admitting: Interventional Radiology

## 2012-11-24 DIAGNOSIS — I6521 Occlusion and stenosis of right carotid artery: Secondary | ICD-10-CM

## 2012-12-01 ENCOUNTER — Encounter: Payer: Self-pay | Admitting: Physical Medicine & Rehabilitation

## 2012-12-01 ENCOUNTER — Ambulatory Visit (HOSPITAL_BASED_OUTPATIENT_CLINIC_OR_DEPARTMENT_OTHER): Payer: BC Managed Care – PPO | Admitting: Physical Medicine & Rehabilitation

## 2012-12-01 ENCOUNTER — Encounter: Payer: BC Managed Care – PPO | Attending: Physical Medicine & Rehabilitation

## 2012-12-01 VITALS — BP 129/69 | HR 77 | Resp 14 | Ht 66.0 in | Wt 168.0 lb

## 2012-12-01 DIAGNOSIS — I69921 Dysphasia following unspecified cerebrovascular disease: Secondary | ICD-10-CM | POA: Insufficient documentation

## 2012-12-01 DIAGNOSIS — I69922 Dysarthria following unspecified cerebrovascular disease: Secondary | ICD-10-CM

## 2012-12-01 DIAGNOSIS — R1310 Dysphagia, unspecified: Secondary | ICD-10-CM | POA: Insufficient documentation

## 2012-12-01 DIAGNOSIS — G8110 Spastic hemiplegia affecting unspecified side: Secondary | ICD-10-CM

## 2012-12-01 DIAGNOSIS — Z79899 Other long term (current) drug therapy: Secondary | ICD-10-CM | POA: Insufficient documentation

## 2012-12-01 DIAGNOSIS — I69920 Aphasia following unspecified cerebrovascular disease: Secondary | ICD-10-CM | POA: Insufficient documentation

## 2012-12-01 DIAGNOSIS — I69991 Dysphagia following unspecified cerebrovascular disease: Secondary | ICD-10-CM | POA: Insufficient documentation

## 2012-12-01 NOTE — Progress Notes (Signed)
Subjective:    Patient ID: Tiffany [name]. Frasier, female    DOB: 05-10-1951, 62 y.o.   MRN: [id number]  HPI [name] is a 62 y.o. RH- female with history of CAD, HTN, TIA event a month ago; who developed difficulty speaking with right sided weakness on am of 10/16/12. She was treated with TPA. MRI brain done revealing L-MCA territory infarct affecting insula, operculum, left motor strip, superior parietal lobe, and left splenium. MRA brain with LMCA occlusion in M1 segment with no distal flow signal. She did have worsening of symptoms past admission and follow up CCT without evidence of bleed. TEE was negative for PFO, ASD or thrombus. Patient was placed on ASA and plavix for embolic CVA and plans for angio with stent by IVR in the near future. Patient continued to be limited by aphasia with dysphagia, right sided weakness and therapy team recommended CIR.   Seen by Dr. Suann Larry for interventional radiology. Status post stenting. Repeat carotid Dopplers in 3-4 months ordered.  HH PT/OT/SLP No falls at home. No significant pain. Has seen primary care physician in followup already.  effexor increased to 150mg  by PCP Pain Inventory Average Pain 1 Pain Right Now 0 My pain is tingling  In the last 24 hours, has pain interfered with the following? General activity 0 Relation with others 0 Enjoyment of life 0 What TIME of day is your pain at its worst? morning Sleep (in general) Poor  Pain is worse with: other Pain improves with: other Relief from Meds: no pain  Mobility walk with assistance use a cane use a walker ability to climb steps?  yes do you drive?  no use a wheelchair needs help with transfers  Function employed # of hrs/week . Do you have any goals in this area?  yes  Neuro/Psych bladder control problems weakness depression anxiety  Prior Studies Any changes since last visit?  no  Physicians involved in your care Any changes since last visit?   no   History reviewed. No pertinent family history. History   Social History  . Marital Status: Unknown    Spouse Name: N/A    Number of Children: N/A  . Years of Education: N/A   Social History Main Topics  . Smoking status: Current Every Day Smoker -- 1.00 packs/day  . Smokeless tobacco: None  . Alcohol Use: No  . Drug Use: No  . Sexually Active: No   Other Topics Concern  . None   Social History Narrative  . None   Past Surgical History  Procedure Laterality Date  . Cardiac surgery    . Tee without cardioversion N/A 10/18/2012    Procedure: TRANSESOPHAGEAL ECHOCARDIOGRAM (TEE);  Surgeon: Vesta Mixer, MD;  Location: [facility] ENDOSCOPY;  Service: Cardiovascular;  Laterality: N/A;  . Coronary artery bypass graft    . Radiology with anesthesia N/A 11/09/2012    Procedure: RADIOLOGY WITH ANESTHESIA- CAROTID STENT;  Surgeon: Oneal Grout, MD;  Location: [facility] OR;  Service: Radiology;  Laterality: N/A;   Past Medical History  Diagnosis Date  . Coronary artery disease   . Hypertension   . TIA (transient ischemic attack)   . High cholesterol   . MI (myocardial infarction)   . Stroke   . Arthritis    BP 129/69  Pulse 77  Resp 14  Ht 5\' 6"  (1.676 m)  SpO2 97%   Review of Systems  Constitutional: Positive for appetite change and unexpected weight change.  Genitourinary:  Bladder control problems  Neurological: Positive for weakness.  Hematological: Bruises/bleeds easily.  Psychiatric/Behavioral: Positive for dysphoric mood. The patient is nervous/anxious.   All other systems reviewed and are negative.       Objective:   Physical Exam  Nursing note and vitals reviewed. Constitutional: She is oriented to person, place, and time. She appears well-developed and well-nourished.  HENT:  Head: Normocephalic and atraumatic.  Eyes: Conjunctivae and EOM are normal. Pupils are equal, round, and reactive to light.  Neck: Normal range of motion.  Neurological:  She is alert and oriented to person, place, and time.  Reflex Scores:      Tricep reflexes are 3+ on the right side and 2+ on the left side.      Bicep reflexes are 3+ on the right side and 2+ on the left side.      Brachioradialis reflexes are 3+ on the right side and 2+ on the left side.      Patellar reflexes are 3+ on the right side and 2+ on the left side.      Achilles reflexes are 3+ on the right side and 2+ on the left side. Psychiatric: She has a normal mood and affect.   Short phrase level speech Sensation mildly reduced         Assessment & Plan:  1.    Infarct with R HP,Aphasia, Cont. HHPT,OT,SLP.  RTC 32mo F/u PCP F/u IR No driving No work  2. Insomnia had good results with trazodone in the hospital. Received a prescription for Ambien from PCP which she should not fill. Will trial trazodone 25 mg each bedtime again.

## 2012-12-29 ENCOUNTER — Ambulatory Visit: Payer: BC Managed Care – PPO | Admitting: Physical Medicine & Rehabilitation

## 2013-02-02 ENCOUNTER — Encounter: Payer: Self-pay | Admitting: Physical Medicine & Rehabilitation

## 2013-02-02 ENCOUNTER — Ambulatory Visit (HOSPITAL_BASED_OUTPATIENT_CLINIC_OR_DEPARTMENT_OTHER): Payer: BC Managed Care – PPO | Admitting: Physical Medicine & Rehabilitation

## 2013-02-02 ENCOUNTER — Encounter: Payer: BC Managed Care – PPO | Attending: Physical Medicine & Rehabilitation

## 2013-02-02 VITALS — BP 115/70 | HR 90 | Resp 14 | Ht 66.0 in | Wt 161.6 lb

## 2013-02-02 DIAGNOSIS — G8110 Spastic hemiplegia affecting unspecified side: Secondary | ICD-10-CM

## 2013-02-02 DIAGNOSIS — I69920 Aphasia following unspecified cerebrovascular disease: Secondary | ICD-10-CM | POA: Insufficient documentation

## 2013-02-02 DIAGNOSIS — I69991 Dysphagia following unspecified cerebrovascular disease: Secondary | ICD-10-CM | POA: Insufficient documentation

## 2013-02-02 DIAGNOSIS — R1310 Dysphagia, unspecified: Secondary | ICD-10-CM | POA: Insufficient documentation

## 2013-02-02 DIAGNOSIS — I69921 Dysphasia following unspecified cerebrovascular disease: Secondary | ICD-10-CM

## 2013-02-02 DIAGNOSIS — Z79899 Other long term (current) drug therapy: Secondary | ICD-10-CM | POA: Insufficient documentation

## 2013-02-02 NOTE — Progress Notes (Signed)
Subjective:    Patient ID: Tiffany [name]. Tiffany [name], female    DOB: 10/01/1950, 62 y.o.   MRN: [id number] Tiffany [name] is a 62 y.o. RH- female with history of CAD, HTN, TIA event a month ago; who developed difficulty speaking with right sided weakness on am of 10/16/12. She was treated with TPA. MRI brain done revealing L-MCA territory infarct affecting insula, operculum, left motor strip, superior parietal lobe, and left splenium. MRA brain with LMCA occlusion in M1 segment with no distal flow signal. She did have worsening of symptoms past admission and follow up CCT without evidence of bleed. TEE was negative for PFO, ASD or thrombus. Patient was placed on ASA and plavix for embolic CVA and plans for angio with stent by IVR in the near future. HPI 6 wks HH therapy Outpt [facility] PT,OT and SLP Repeat carotid doppler in one month Dr Corliss Skains See PCP Dr Nedra Hai  Pain Inventory Average Pain 2 Pain Right Now 2 My pain is sharp, dull and stabbing  In the last 24 hours, has pain interfered with the following? General activity 2 Relation with others 2 Enjoyment of life 2 What TIME of day is your pain at its worst? evening Sleep (in general) Fair  Pain is worse with: some activites Pain improves with: therapy/exercise Relief from Meds: 8  Mobility use a cane ability to climb steps?  yes  Function I need assistance with the following:  meal prep, household duties and shopping  Neuro/Psych weakness numbness loss of taste or smell  Prior Studies Any changes since last visit?  no  Physicians involved in your care Any changes since last visit?  no   History reviewed. No pertinent family history. History   Social History  . Marital Status: Unknown    Spouse Name: N/A    Number of Children: N/A  . Years of Education: N/A   Social History Main Topics  . Smoking status: Current Every Day Smoker -- 1.00 packs/day  . Smokeless tobacco: None  . Alcohol Use: No  . Drug Use:  No  . Sexual Activity: No   Other Topics Concern  . None   Social History Narrative  . None   Past Surgical History  Procedure Laterality Date  . Cardiac surgery    . Tee without cardioversion N/A 10/18/2012    Procedure: TRANSESOPHAGEAL ECHOCARDIOGRAM (TEE);  Surgeon: Vesta Mixer, MD;  Location: [facility] ENDOSCOPY;  Service: Cardiovascular;  Laterality: N/A;  . Coronary artery bypass graft    . Radiology with anesthesia N/A 11/09/2012    Procedure: RADIOLOGY WITH ANESTHESIA- CAROTID STENT;  Surgeon: Oneal Grout, MD;  Location: [facility] OR;  Service: Radiology;  Laterality: N/A;   Past Medical History  Diagnosis Date  . Coronary artery disease   . Hypertension   . TIA (transient ischemic attack)   . High cholesterol   . MI (myocardial infarction)   . Stroke   . Arthritis    BP 115/70  Pulse 90  Resp 14  Ht 5\' 6"  (1.676 m)  Wt 161 lb 9.6 oz (73.301 kg)  BMI 26.1 kg/m2  SpO2 93%    Review of Systems  Constitutional: Positive for unexpected weight change.       Loss of taste or smell  Neurological: Positive for weakness and numbness.  All other systems reviewed and are negative.       Objective:   Physical Exam  Nursing note and vitals reviewed.  Constitutional: She is oriented to person, place,  and time. She appears well-developed and well-nourished.  HENT:  Head: Normocephalic and atraumatic.  Eyes: Conjunctivae and EOM are normal. Pupils are equal, round, and reactive to light.  Neck: Normal range of motion.  Neurological: She is alert and oriented to person, place, and time.  Reflex Scores:  Tricep reflexes are 3+ on the right side and 2+ on the left side.  Bicep reflexes are 3+ on the right side and 2+ on the left side.  Brachioradialis reflexes are 3+ on the right side and 2+ on the left side.  Patellar reflexes are 3+ on the right side and 2+ on the left side.  Achilles reflexes are 3+ on the right side and 2+ on the left side. Psychiatric: She has a  normal mood and affect.   Short phrase level speech  Sensation mildly reduced 3-/5 R delt, Bi,Tri, grip 4+ R HF, KE, 3- ankle DF/PF Able to do R finger thumb opposition      Assessment & Plan:  1. Infarct with R HP,Aphasia, Cont. Outpt PT,OT,SLP. RTC 65mo  F/u PCP  F/u IR  No driving until f/u visit No work until f/u visit RTC 4-6 weeks

## 2013-02-02 NOTE — Patient Instructions (Addendum)
No driving No work at the present

## 2013-02-22 ENCOUNTER — Other Ambulatory Visit: Payer: Self-pay | Admitting: Radiology

## 2013-02-22 DIAGNOSIS — I6529 Occlusion and stenosis of unspecified carotid artery: Secondary | ICD-10-CM

## 2013-03-06 ENCOUNTER — Telehealth (HOSPITAL_COMMUNITY): Payer: Self-pay | Admitting: Interventional Radiology

## 2013-03-06 NOTE — Telephone Encounter (Signed)
Called pt left VM for her to call and schedule 4 month f/u appt Tiffany [name]

## 2013-03-13 ENCOUNTER — Encounter: Payer: BC Managed Care – PPO | Attending: Physical Medicine & Rehabilitation

## 2013-03-13 ENCOUNTER — Ambulatory Visit (HOSPITAL_BASED_OUTPATIENT_CLINIC_OR_DEPARTMENT_OTHER): Payer: BC Managed Care – PPO | Admitting: Physical Medicine & Rehabilitation

## 2013-03-13 ENCOUNTER — Encounter: Payer: Self-pay | Admitting: Physical Medicine & Rehabilitation

## 2013-03-13 VITALS — BP 121/60 | HR 68 | Resp 14 | Ht 66.0 in | Wt 164.0 lb

## 2013-03-13 DIAGNOSIS — Z79899 Other long term (current) drug therapy: Secondary | ICD-10-CM | POA: Insufficient documentation

## 2013-03-13 DIAGNOSIS — I69991 Dysphagia following unspecified cerebrovascular disease: Secondary | ICD-10-CM | POA: Insufficient documentation

## 2013-03-13 DIAGNOSIS — I69920 Aphasia following unspecified cerebrovascular disease: Secondary | ICD-10-CM | POA: Insufficient documentation

## 2013-03-13 DIAGNOSIS — G8110 Spastic hemiplegia affecting unspecified side: Secondary | ICD-10-CM

## 2013-03-13 DIAGNOSIS — M7500 Adhesive capsulitis of unspecified shoulder: Secondary | ICD-10-CM

## 2013-03-13 DIAGNOSIS — M7501 Adhesive capsulitis of right shoulder: Secondary | ICD-10-CM

## 2013-03-13 DIAGNOSIS — R1310 Dysphagia, unspecified: Secondary | ICD-10-CM | POA: Insufficient documentation

## 2013-03-13 DIAGNOSIS — G890 Central pain syndrome: Secondary | ICD-10-CM

## 2013-03-13 MED ORDER — GABAPENTIN 300 MG PO CAPS: 300.0000 mg | ORAL_CAPSULE | Freq: Every day | ORAL | Status: DC

## 2013-03-13 NOTE — Progress Notes (Signed)
Subjective:    Patient ID: Tiffany [name]. Tiffany [name], female    DOB: 06/14/1951, 62 y.o.   MRN: [id number]  HPI  62 y.o. RH- female with history of CAD, HTN, TIA event a month ago; who developed difficulty speaking with right sided weakness on am of 10/16/12. She was treated with TPA. MRI brain done revealing L-MCA territory infarct affecting insula, operculum, left motor strip, superior parietal lobe, and left splenium. MRA brain with LMCA occlusion in M1 segment with no distal flow signal. She did have worsening of symptoms past admission and follow up CCT without evidence of bleed. TEE was negative for PFO, ASD or thrombus. Patient was placed on ASA and plavix for embolic CVA and plans for angio with stent by IVR in the near future.  HPI  6 wks HH therapy  Outpt [facility] finished PT, continues OT and SLP  Repeat carotid doppler per Dr Corliss Skains  See PCP Dr Romona Curls [facility] for driving eval in [place]  R hand pain, cold water on burn feeling in evening, keeps the patient awake Pain Inventory Average Pain 5 Pain Right Now 3 My pain is sharp, burning and stabbing  In the last 24 hours, has pain interfered with the following? General activity 4 Relation with others 4 Enjoyment of life 4 What TIME of day is your pain at its worst? night Sleep (in general) Fair  Pain is worse with: some activites Pain improves with: rest Relief from Meds: 0  Mobility use a cane ability to climb steps?  yes do you drive?  no  Function disabled: date disabled .  Neuro/Psych tremor  Prior Studies Any changes since last visit?  no  Physicians involved in your care Any changes since last visit?  no   History reviewed. No pertinent family history. History   Social History  . Marital Status: Unknown    Spouse Name: N/A    Number of Children: N/A  . Years of Education: N/A   Social History Main Topics  . Smoking status: Current Every Day Smoker -- 1.00 packs/day  .  Smokeless tobacco: None  . Alcohol Use: No  . Drug Use: No  . Sexual Activity: No   Other Topics Concern  . None   Social History Narrative  . None   Past Surgical History  Procedure Laterality Date  . Cardiac surgery    . Tee without cardioversion N/A 10/18/2012    Procedure: TRANSESOPHAGEAL ECHOCARDIOGRAM (TEE);  Surgeon: Vesta Mixer, MD;  Location[facility] ENDOSCOPY;  Service: Cardiovascular;  Laterality: N/A;  . Coronary artery bypass graft    . Radiology with anesthesia N/A 11/09/2012    Procedure: RADIOLOGY WITH ANESTHESIA- CAROTID STENT;  Surgeon: Oneal Grout, MD;  Location: [facility] OR;  Service: Radiology;  Laterality: N/A;   Past Medical History  Diagnosis Date  . Coronary artery disease   . Hypertension   . TIA (transient ischemic attack)   . High cholesterol   . MI (myocardial infarction)   . Stroke   . Arthritis    BP 121/60  Pulse 68  Resp 14  Ht 5\' 6"  (1.676 m)  Wt 164 lb (74.39 kg)  BMI 26.48 kg/m2  SpO2 98%    Review of Systems  Neurological: Positive for tremors.  All other systems reviewed and are negative.       Objective:   Physical Exam  Nursing note and vitals reviewed.  Constitutional: She is oriented to person, place, and time. She appears well-developed  and well-nourished.  HENT:  Head: Normocephalic and atraumatic.  Eyes: Conjunctivae and EOM are normal. Pupils are equal, round, and reactive to light.  Neck: Normal range of motion.  Neurological: She is alert and oriented to person, place, and time.  Reflex Scores:  Tricep reflexes are 3+ on the right side and 2+ on the left side.  Bicep reflexes are 3+ on the right side and 2+ on the left side.  Brachioradialis reflexes are 3+ on the right side and 2+ on the left side.  Patellar reflexes are 3+ on the right side and 2+ on the left side.  Achilles reflexes are 3+ on the right side and 2+ on the left side. Psychiatric: She has a normal mood and affect.  Short phrase level speech   Sensation mildly reduced  3-/5 R delt, Bi,Tri, grip  4+ R HF, KE, 3- ankle DF/PF  Able to do R finger thumb opposition Decreased range of motion in the left shoulder with external rotation. Abduction to 90 for flexion to 90. Pain with external rotation No swelling in the left hand. No evidence of erythema.      Assessment & Plan:  1. Infarct with R HP,Aphasia, Cont. Outpt PT,OT,SLP. RTC 59mo  F/u PCP  F/u IR  No driving until f/u visit  Would also hold off on driving evaluation until shoulder range of motion has improved No work until f/u visit   2. 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  Nocturnal neurogenic pain add gabapentin

## 2013-03-13 NOTE — Patient Instructions (Signed)
We will do ultrasound guided shoulder injections for the frozen shoulder problem  We will start gabapentin at night for the burning nerve pain

## 2013-03-19 ENCOUNTER — Ambulatory Visit (INDEPENDENT_AMBULATORY_CARE_PROVIDER_SITE_OTHER): Payer: BC Managed Care – PPO | Admitting: Neurology

## 2013-03-19 ENCOUNTER — Ambulatory Visit (HOSPITAL_COMMUNITY)
Admission: RE | Admit: 2013-03-19 | Discharge: 2013-03-19 | Disposition: A | Discharge status: Home / Self Care | Payer: BC Managed Care – PPO | Source: Ambulatory Visit | Attending: Interventional Radiology | Admitting: Interventional Radiology

## 2013-03-19 ENCOUNTER — Encounter: Payer: Self-pay | Admitting: Neurology

## 2013-03-19 DIAGNOSIS — I6529 Occlusion and stenosis of unspecified carotid artery: Secondary | ICD-10-CM

## 2013-03-19 DIAGNOSIS — R0989 Other specified symptoms and signs involving the circulatory and respiratory systems: Secondary | ICD-10-CM

## 2013-03-19 DIAGNOSIS — I651 Occlusion and stenosis of basilar artery: Secondary | ICD-10-CM | POA: Insufficient documentation

## 2013-03-19 NOTE — Patient Instructions (Addendum)
Continue Plavix alone and discontinue aspirin for stroke prevention and strict control of lipids his LDL cholesterol goal below 100 mg percent and hypertension with blood pressure goal below 120/80. Continue followup with Dr. Sharion Settler for a right frozen shoulder treatment. Return for followup in 3 months with Larita Fife, NP

## 2013-03-19 NOTE — Progress Notes (Signed)
[facility] [address] [place]. [place] [address] [phone]       OFFICE FOLLOW-UP NOTE  Ms. Berkleigh Beckles. [name] Date of Birth:  [birth year] Medical Record Number:  [id number]   HPI: 3 year Caucasian lady seen for first office f/u visit after [facility] admission on 10/16/12  With acute onset aphasia and right hemiparesis and right facial droop.NIHSS was 13 on admission and she was treated with iv TPA . CT head subsequently showed subtle hypodensity in left periinsular region without hemorrhage . MRI showed left insular and opercular cortex infarct with mild cytotoxic edema.MRA brain showed occluded left m 1 MCA.Transthoraxic echo showed normal ejection fraction.TEE showed no clot or PFO.Carotid dopplers showed left ICA occlusion and greater than 80% RICA stenosis.She has h/o transient right sided weakness in March 2014 and saw Dr Lendell Caprice a neurologist in [place] who did outpatient MRi showing small left brain infarct.MRA neck was ordered as outpatient and results were pending at the time of her stroke on 10/16/12.vascular risk factors identified included HT, Hyperlipidimia,CAD,Obesity, carotid occlusion and TIA.Patient was seen by therapies and transferred to inpatient rehab and has done well with good recovery of strength and speech except when she is tired when she gets words hesitancy and dragging of right leg transiently.She is having pain in right shoulder from frozen shoulder and plans injection treatment for the same with Dr Wynn Banker.She has been compliant with her meidcines and and has finished her therapy sessionas. Her blood pressure is good and is 124/68 today.She wals with acne with good balance and no recent falls.She underwent elective right ICA stenting by Dr Corliss Skains on 11/09/12 and is tolerating aspirin and plavix but has increased bruising but no bleeding.  ROS:   14 system review of systems is positive for aching muscles,slurred speech,depression,not enough  sleep,decreased energy,sleepiness,insomniarunning nose  PMH:  Past Medical History  Diagnosis Date  . Coronary artery disease   . Hypertension   . TIA (transient ischemic attack)   . High cholesterol   . MI (myocardial infarction)   . Stroke   . Arthritis     Social History:  History   Social History  . Marital Status: Unknown    Spouse Name: russell    Number of Children: 1  . Years of Education: college   Occupational History  . disability    Social History Main Topics  . Smoking status: Former Smoker -- 0.00 packs/day    Quit date: 10/19/2012  . Smokeless tobacco: Not on file  . Alcohol Use: No  . Drug Use: No  . Sexual Activity: No   Other Topics Concern  . Not on file   Social History Narrative  . No narrative on file    Medications:   Current Outpatient Prescriptions on File Prior to Visit  Medication Sig Dispense Refill  . aspirin EC 325 MG tablet Take 325 mg by mouth daily.      Marland Kitchen atorvastatin (LIPITOR) 40 MG tablet Take 40 mg by mouth daily.      . baclofen (LIORESAL) 5 mg TABS Take 0.5 tablets (5 mg total) by mouth at bedtime.  30 tablet  1  . cetirizine (ZYRTEC) 10 MG tablet Take 10 mg by mouth daily.      . clonazePAM (KLONOPIN) 0.5 MG tablet Take 0.5 mg by mouth 2 (two) times daily as needed for anxiety.       . clopidogrel (PLAVIX) 75 MG tablet Take 1 tablet (75 mg total) by mouth daily with breakfast.  30 tablet  1  . gabapentin (NEURONTIN) 300 MG capsule Take 1 capsule (300 mg total) by mouth at bedtime.  30 capsule  1  . HYDROcodone-acetaminophen (VICODIN) 5-500 MG per tablet Take 1 tablet by mouth every 6 (six) hours as needed for pain.  30 tablet  0  . metoprolol (LOPRESSOR) 50 MG tablet Take 0.5 tablets (25 mg total) by mouth 2 (two) times daily.      . mometasone (NASONEX) 50 MCG/ACT nasal spray Place 2 sprays into the nose daily.      . traZODone (DESYREL) 25 mg TABS Take 0.5-1 tablets (25-50 mg total) by mouth at bedtime as needed for  sleep.  30 tablet  1  . venlafaxine XR (EFFEXOR-XR) 75 MG 24 hr capsule Take 75 mg by mouth daily.       Current Facility-Administered Medications on File Prior to Visit  Medication Dose Route Frequency Provider Last Rate Last Dose  . fentaNYL (SUBLIMAZE) injection 25-50 mcg  25-50 mcg Intravenous Q5 min PRN Bedelia Person, MD        Allergies:   Allergies  Allergen Reactions  . Codeine Nausea Only   Filed Vitals:   03/19/13 1510  BP: 124/68  Pulse: 102  Temp: 97.9 F (36.6 C)    Physical Exam General: well developed, well nourished, seated, in no evident distress Head: head normocephalic and atraumatic. Orohparynx benign Neck: supple with no carotid or supraclavicular bruits Cardiovascular: regular rate and rhythm, no murmurs Musculoskeletal: no deformity Skin:  no rash/petichiae Vascular:  Normal pulses all extremities  Neurologic Exam Mental Status: Awake and fully alert. Oriented to place and time. Recent and remote memory intact. Attention span, concentration and fund of knowledge appropriate. Mood and affect appropriate. Occasional word finding difficulties Cranial Nerves: Fundoscopic exam reveals sharp disc margins. Pupils equal, briskly reactive to light. Extraocular movements full without nystagmus. Visual fields full to confrontation. Hearing intact. Mild right face asymmetry.Facial sensation intact. Face, tongue, palate moves normally and symmetrically.  Motor: Normal bulk and tone. Mild right hemiparesis 4/5 with spasticity and increase tone on right .right shoulder elevation limited due to pain from frozen shoulder.dimishded fine finger movements on right. Sensory.: intact to touch and pinprick and vibratory sensation.  Coordination: Rapid alternating movements normal in all extremities. Finger-to-nose and heel-to-shin performed accurately bilaterally. Gait and Station: Arises from chair without difficulty. Stance is normal. Gait demonstrates spatic hemiplegic gait with  dragging and stiffness of right leg. Reflexes: 2+ and asymmetric and brisker on right. Toes downgoing.   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 Right ICA greater than 80 % RICA stenosis s/p PTA/stenting by Dr Corliss Skains on 11/09/12 by Dr Corliss Skains    PLAN: Continue Plavix alone  and discontinue aspirin for stroke prevention and strict control of lipids his LDL cholesterol goal below 100 mg percent and hypertension with blood pressure goal below 120/80. Continue followup with Dr. Sharion Settler for a right frozen shoulder treatment. Return for followup in 3 months with Larita Fife, NP

## 2013-03-19 NOTE — Progress Notes (Signed)
VASCULAR LAB PRELIMINARY  PRELIMINARY  PRELIMINARY  PRELIMINARY  Carotid duplex  completed.    Preliminary report:  Right:  Stent patent.  No evidence of restenosis.  Left:  Occluded internal carotid artery.  Bilateral:  Vertebral artery flow is antegrade.     Claudy Abdallah, RVT 03/19/2013, 11:55 AM

## 2013-03-21 ENCOUNTER — Telehealth (HOSPITAL_COMMUNITY): Payer: Self-pay | Admitting: Interventional Radiology

## 2013-03-21 NOTE — Telephone Encounter (Signed)
Called pt back and told her that per Dr. Corliss Skains she is to continue on her meds as prescribed. If she develops any problems she is to call me back ASAP. Pt states understanding and is complete agreement with this plan. JM

## 2013-03-21 NOTE — Telephone Encounter (Signed)
Called pt - asked her if she was having any new sx - she states she is asymptomatic. I also asked if she was still taking her Aspirin and Plavix, she states is taking both medications. Takes Asprin 325mg  q d and Plavix 75mg  q d. Pt states that Dr. Pearlean Brownie has told her that she really does not need to continue on the aspirin if she is taking the Coumadin. She wanted to know if she should DC the Aspirin. I advised her that I would speak to Dr. Corliss Skains and call her back with the answer to that question but that she should continue her medications as prescribed by Dr. Corliss Skains unless he changes these. Patient is in complete agreement with this. JM

## 2013-04-06 ENCOUNTER — Encounter: Payer: Self-pay | Admitting: Physical Medicine & Rehabilitation

## 2013-04-06 ENCOUNTER — Ambulatory Visit (HOSPITAL_BASED_OUTPATIENT_CLINIC_OR_DEPARTMENT_OTHER): Payer: BC Managed Care – PPO | Admitting: Physical Medicine & Rehabilitation

## 2013-04-06 ENCOUNTER — Encounter: Payer: BC Managed Care – PPO | Attending: Physical Medicine & Rehabilitation

## 2013-04-06 VITALS — BP 139/70 | HR 83 | Resp 14 | Ht 66.0 in | Wt 165.0 lb

## 2013-04-06 DIAGNOSIS — R1310 Dysphagia, unspecified: Secondary | ICD-10-CM | POA: Insufficient documentation

## 2013-04-06 DIAGNOSIS — M7500 Adhesive capsulitis of unspecified shoulder: Secondary | ICD-10-CM

## 2013-04-06 DIAGNOSIS — Z79899 Other long term (current) drug therapy: Secondary | ICD-10-CM | POA: Insufficient documentation

## 2013-04-06 DIAGNOSIS — M7501 Adhesive capsulitis of right shoulder: Secondary | ICD-10-CM

## 2013-04-06 DIAGNOSIS — I69920 Aphasia following unspecified cerebrovascular disease: Secondary | ICD-10-CM | POA: Insufficient documentation

## 2013-04-06 DIAGNOSIS — I69991 Dysphagia following unspecified cerebrovascular disease: Secondary | ICD-10-CM | POA: Insufficient documentation

## 2013-04-06 MED ORDER — GABAPENTIN 300 MG PO CAPS: 300.0000 mg | ORAL_CAPSULE | Freq: Three times a day (TID) | ORAL | Status: DC

## 2013-04-06 NOTE — Patient Instructions (Signed)
Need a total of 3 shoulder injections for frozen shoulder syndrome

## 2013-04-06 NOTE — Progress Notes (Signed)
Shoulder injection Right (with ultrasound guidance)  Indication:Right Shoulder pain not relieved by medication management and other conservative care.  Informed consent was obtained after describing risks and benefits of the procedure with the patient, this includes bleeding, bruising, infection and medication side effects. The patient wishes to proceed and has given written consent. Patient was placed in a seated position. The Right shoulder was marked and prepped with betadine in the subacromial area. A 25-gauge 1-1/2 inch needle was inserted into the subacromial area. After negative draw back for blood, a solution containing 1 mL of 40 mg per ML depomedrol and 3 mL of 1% lidocaine was injected. A band aid was applied. The patient tolerated the procedure well. Post procedure instructions were given.

## 2013-05-08 ENCOUNTER — Encounter: Payer: BC Managed Care – PPO | Attending: Physical Medicine & Rehabilitation

## 2013-05-08 ENCOUNTER — Encounter: Payer: Self-pay | Admitting: Physical Medicine & Rehabilitation

## 2013-05-08 ENCOUNTER — Ambulatory Visit (HOSPITAL_BASED_OUTPATIENT_CLINIC_OR_DEPARTMENT_OTHER): Payer: BC Managed Care – PPO | Admitting: Physical Medicine & Rehabilitation

## 2013-05-08 VITALS — BP 139/62 | HR 62 | Resp 14 | Ht 67.0 in | Wt 169.0 lb

## 2013-05-08 DIAGNOSIS — Z79899 Other long term (current) drug therapy: Secondary | ICD-10-CM | POA: Insufficient documentation

## 2013-05-08 DIAGNOSIS — I69991 Dysphagia following unspecified cerebrovascular disease: Secondary | ICD-10-CM | POA: Insufficient documentation

## 2013-05-08 DIAGNOSIS — M7500 Adhesive capsulitis of unspecified shoulder: Secondary | ICD-10-CM

## 2013-05-08 DIAGNOSIS — I69920 Aphasia following unspecified cerebrovascular disease: Secondary | ICD-10-CM | POA: Insufficient documentation

## 2013-05-08 DIAGNOSIS — R1310 Dysphagia, unspecified: Secondary | ICD-10-CM | POA: Insufficient documentation

## 2013-05-08 DIAGNOSIS — M7501 Adhesive capsulitis of right shoulder: Secondary | ICD-10-CM

## 2013-05-08 NOTE — Progress Notes (Signed)
Shoulder injection Right (with ultrasound guidance)  Indication:Right Shoulder pain not relieved by medication management and other conservative care.  Informed consent was obtained after describing risks and benefits of the procedure with the patient, this includes bleeding, bruising, infection and medication side effects. The patient wishes to proceed and has given written consent. Patient was placed in a seated position. The Right shoulder was marked and prepped with betadine in the subacromial area. A 25-gauge 1-1/2 inch needle was inserted into the subacromial area. After negative draw back for blood, a solution containing 1 mL of 6mg per ML betamethasone and 4 mL of 1% lidocaine was injected. A band aid was applied. The patient tolerated the procedure well. Post procedure instructions were given. 

## 2013-05-08 NOTE — Patient Instructions (Signed)
Return in 3 weeks for the third right shoulder injection under ultrasound guidance

## 2013-06-04 ENCOUNTER — Ambulatory Visit: Payer: BC Managed Care – PPO | Admitting: Physical Medicine & Rehabilitation

## 2013-06-18 ENCOUNTER — Ambulatory Visit: Payer: BC Managed Care – PPO | Admitting: Physical Medicine & Rehabilitation

## 2013-06-25 ENCOUNTER — Ambulatory Visit (INDEPENDENT_AMBULATORY_CARE_PROVIDER_SITE_OTHER): Payer: BC Managed Care – PPO | Admitting: Nurse Practitioner

## 2013-06-25 ENCOUNTER — Encounter (INDEPENDENT_AMBULATORY_CARE_PROVIDER_SITE_OTHER): Payer: Self-pay

## 2013-06-25 ENCOUNTER — Encounter: Payer: Self-pay | Admitting: Nurse Practitioner

## 2013-06-25 VITALS — BP 133/86 | HR 75 | Ht 65.0 in | Wt 170.0 lb

## 2013-06-25 DIAGNOSIS — G8110 Spastic hemiplegia affecting unspecified side: Secondary | ICD-10-CM

## 2013-06-25 DIAGNOSIS — I63239 Cerebral infarction due to unspecified occlusion or stenosis of unspecified carotid arteries: Secondary | ICD-10-CM

## 2013-06-25 DIAGNOSIS — G8111 Spastic hemiplegia affecting right dominant side: Secondary | ICD-10-CM

## 2013-06-25 DIAGNOSIS — I6350 Cerebral infarction due to unspecified occlusion or stenosis of unspecified cerebral artery: Secondary | ICD-10-CM

## 2013-06-25 DIAGNOSIS — F329 Major depressive disorder, single episode, unspecified: Secondary | ICD-10-CM

## 2013-06-25 DIAGNOSIS — IMO0002 Reserved for concepts with insufficient information to code with codable children: Secondary | ICD-10-CM

## 2013-06-25 DIAGNOSIS — F3289 Other specified depressive episodes: Secondary | ICD-10-CM

## 2013-06-25 MED ORDER — VENLAFAXINE HCL ER 150 MG PO CP24: 150.0000 mg | ORAL_CAPSULE | Freq: Every day | ORAL | Status: DC

## 2013-06-25 NOTE — Patient Instructions (Addendum)
PLAN:  Increase EffexorXR to 150 mg daily.  Rx sent to [facility]. If not feeling better in 6 weeks, call for referral to Psychiatrist.  Continue Plavix and aspirin for stroke prevention (per Dr. Corliss Skainseveshwar) and strict control of lipids with LDL cholesterol goal below 100 mg percent and hypertension with blood pressure goal below 120/80.  Continue followup with Dr. Sharion SettlerKisteins for a right frozen shoulder treatment.  Return for followup in 6 months with Larita FifeLynn, NP   Graduated return to driving instructions were provided. It is recommended that the patient first drives with another licensed driver in an empty parking lot. If the patient does well with this, and they can drive on a quiet street with the licensed driver. If the patient does well with this they can drive on a busy street with a licensed driver. If the patient does well with this, the next time out they can go by himself. For the first month after resuming driving, I recommend no nighttime or Interstate driving.

## 2013-06-25 NOTE — Progress Notes (Signed)
PATIENT: Tiffany [name]. Bleicher DOB: 10-Oct-1950   REASON FOR VISIT: stroke follow up HISTORY FROM: patient  HISTORY OF PRESENT ILLNESS: 50 year Caucasian lady seen for first office f/u visit after [facility] admission on 10/16/12 With acute onset aphasia and right hemiparesis and right facial droop.NIHSS was 13 on admission and she was treated with iv TPA . CT head subsequently showed subtle hypodensity in left periinsular region without hemorrhage . MRI showed left insular and opercular cortex infarct with mild cytotoxic edema.MRA brain showed occluded left m 1 MCA.Transthoraxic echo showed normal ejection fraction.TEE showed no clot or PFO.Carotid dopplers showed left ICA occlusion and greater than 80% RICA stenosis.She has h/o transient right sided weakness in March 2014 and saw Dr Lendell Caprice a neurologist in [place] who did outpatient MRi showing small left brain infarct.  MRA neck was ordered as outpatient and results were pending at the time of her stroke on 10/16/12. Vascular risk factors identified included HTN, Hyperlipidimia,CAD,Obesity, carotid occlusion and TIA.Patient was seen by therapies and transferred to inpatient rehab and has done well with good recovery of strength and speech except when she is tired when she gets words hesitancy and dragging of right leg transiently. 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 She has been compliant with her medicines and and has finished her therapy sessions. Her blood pressure is good and is 124/68 today.She walks with a cane with good balance and no recent falls. She underwent elective right ICA stenting by Dr Corliss Skains on 11/09/12 and is tolerating aspirin and plavix but has increased bruising but no bleeding.   UPDATE 06/25/13 (LL): Patient returns to office for stroke follow up.  Carotid duplex completed on 03/19/13 showing Right: Stent patent. No evidence of restenosis. Left:  Occluded internal carotid artery. She has improved on the use of her right hand and right leg; is continuing to do exercises learned in therapies.  She is walking short distnaces without cane.  Plans to start water aerobics.  She lacks fine motor control in right hand but it is getting better.  Her speech is improved as well, still with expressive deficits.  She has considerable depression post stroke, mourning the loss of freedom and traveling full-time job.  She used to take Effexor XR 150 mg but was reduced to 75 mg daily after first TIA's occurred in April and was never switched back to 150 mg daily dose after hospitalization.  She has not returned to driving, adding to her depression.  She has had 2 injections to frozen shoulder and is due for a third with Dr. Larna Daughters. Bp is well controlled; is 133/86 in office today.  She is tolerating Plavix and aspirin with mild bruising, no bleeding.  ROS:  14 system review of systems is positive for aching muscles, slurred speech, depression, decreased energy, running nose  ALLERGIES: Allergies  Allergen Reactions  . Codeine Nausea Only    HOME MEDICATIONS: Outpatient Prescriptions Prior to Visit  Medication Sig Dispense Refill  . aspirin EC 325 MG tablet Take 325 mg by mouth daily.      Marland Kitchen atorvastatin (LIPITOR) 40 MG tablet Take 40 mg by mouth daily.      . baclofen (LIORESAL) 5 mg TABS Take 0.5 tablets (5 mg total) by mouth at bedtime.  30 tablet  1  . cetirizine (ZYRTEC) 10 MG tablet Take 10 mg by mouth daily.      . clonazePAM (  KLONOPIN) 0.5 MG tablet Take 0.5 mg by mouth 2 (two) times daily as needed for anxiety.       . clopidogrel (PLAVIX) 75 MG tablet Take 1 tablet (75 mg total) by mouth daily with breakfast.  30 tablet  1  . gabapentin (NEURONTIN) 300 MG capsule Take 1 capsule (300 mg total) by mouth 3 (three) times daily.  90 capsule  1  . HYDROcodone-acetaminophen (VICODIN) 5-500 MG per tablet Take 1 tablet by mouth every 6 (six) hours as  needed for pain.  30 tablet  0  . metoprolol (LOPRESSOR) 50 MG tablet Take 0.5 tablets (25 mg total) by mouth 2 (two) times daily.      . mometasone (NASONEX) 50 MCG/ACT nasal spray Place 2 sprays into the nose daily.      . traZODone (DESYREL) 25 mg TABS Take 0.5-1 tablets (25-50 mg total) by mouth at bedtime as needed for sleep.  30 tablet  1  . venlafaxine XR (EFFEXOR-XR) 75 MG 24 hr capsule Take 75 mg by mouth daily.       Facility-Administered Medications Prior to Visit  Medication Dose Route Frequency Provider Last Rate Last Dose  . fentaNYL (SUBLIMAZE) injection 25-50 mcg  25-50 mcg Intravenous Q5 min PRN Bedelia Person, MD        PAST MEDICAL HISTORY: Past Medical History  Diagnosis Date  . Coronary artery disease   . Hypertension   . TIA (transient ischemic attack)   . High cholesterol   . MI (myocardial infarction)   . Stroke   . Arthritis     PAST SURGICAL HISTORY: Past Surgical History  Procedure Laterality Date  . Cardiac surgery    . Tee without cardioversion N/A 10/18/2012    Procedure: TRANSESOPHAGEAL ECHOCARDIOGRAM (TEE);  Surgeon: Vesta Mixer, MD;  Location: [facility] ENDOSCOPY;  Service: Cardiovascular;  Laterality: N/A;  . Coronary artery bypass graft    . Radiology with anesthesia N/A 11/09/2012    Procedure: RADIOLOGY WITH ANESTHESIA- CAROTID STENT;  Surgeon: Oneal Grout, MD;  Location: [facility] OR;  Service: Radiology;  Laterality: N/A;    FAMILY HISTORY: History reviewed. No pertinent family history.  SOCIAL HISTORY: History   Social History  . Marital Status: Unknown    Spouse Name: Perlie Gold    Number of Children: 1  . Years of Education: Masters   Occupational History  . disability    Social History Main Topics  . Smoking status: Former Smoker -- 0.00 packs/day    Quit date: 10/19/2012  . Smokeless tobacco: Not on file  . Alcohol Use: No  . Drug Use: No  . Sexual Activity: No   Other Topics Concern  . Not on file   Social History Narrative     Pt lives at home with husband Perlie Gold)   Pt is right handed   Education Masters degree   Caffeine 2 cups daily     PHYSICAL EXAM  Filed Vitals:   06/25/13 1505  BP: 133/86  Pulse: 75  Height: 5\' 5"  (1.651 m)  Weight: 170 lb (77.111 kg)   Body mass index is 28.29 kg/(m^2).  Physical Exam  General: well developed, well nourished, seated, in no evident distress  Head: head normocephalic and atraumatic. Orohparynx benign  Neck: supple with no carotid or supraclavicular bruits  Cardiovascular: regular rate and rhythm, no murmurs  Musculoskeletal: no deformity  Skin: no rash/petichiae  Vascular: Normal pulses all extremities   Neurologic Exam  Mental Status: Awake and fully alert. Oriented  to place and time. Recent and remote memory intact. Attention span, concentration and fund of knowledge appropriate. Mood labile. Occasional word finding difficulties.  Cranial Nerves: Pupils equal, briskly reactive to light. Extraocular movements full without nystagmus. Visual fields full to confrontation. Hearing intact. Mild right face asymmetry.  Facial sensation intact. Face, tongue, palate moves normally and symmetrically.  Motor: Normal bulk and tone. Mild right hemiparesis 4/5 with spasticity and increase tone on right shoulder. elevation limited due to pain from frozen shoulder.  Dimishded fine finger movements on right.  Sensory: intact to touch and pinprick. Coordination: Rapid alternating movements decreased on right side. Finger-to-nose and heel-to-shin performed accurately bilaterally.  Gait and Station: Arises from chair without difficulty. Stance is normal. Gait demonstrates spatic hemiplegic gait with dragging and stiffness of right leg.  Reflexes: 2+ and asymmetric and brisker on right. Toes downgoing.   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 Right ICA greater than 80 % RICA stenosis s/p PTA/stenting by Dr Corliss Skainseveshwar on 11/09/12.  Residual right sided hemiparesis, expressive aphasia, and post-stroke depression.  PLAN:  Increase EffexorXR to 150 mg daily.  Rx sent to [facility]. If not feeling better in 6 weeks, call for referral to Psychiatrist. Continue Plavix and aspirin for stroke prevention (per Dr. Corliss Skainseveshwar) and strict control of lipids with LDL cholesterol goal below 100 mg percent and hypertension with blood pressure goal below 120/80.  Graduated return to driving instructions were provided. It is recommended that the patient first drives with another licensed driver in an empty parking lot. If the patient does well with this, and they can drive on a quiet street with the licensed driver. If the patient does well with this they can drive on a busy street with a licensed driver. If the patient does well with this, the next time out they can go by himself. For the first month after resuming driving, I recommend no nighttime or Interstate driving.   Continue followup with Dr. Sharion SettlerKisteins for a right frozen shoulder treatment.  Return for followup in 6 months with Larita FifeLynn, NP   Meds ordered this encounter  Medications  . venlafaxine XR (EFFEXOR XR) 150 MG 24 hr capsule    Sig: Take 1 capsule (150 mg total) by mouth daily with breakfast.    Dispense:  30 capsule    Refill:  5    Order Specific Question:  Supervising Provider    Answer:  Micki RileySETHI, PRAMOD S [[id number]]   Return in about 6 months (around 12/23/2013).  Ronal FearLYNN E. Brockton Mckesson, MSN, NP-C 06/25/2013, 4:46 PM [facility] [address], [address] [place], [place] [address] [phone]  Note: This document was prepared with digital dictation and possible smart phrase technology. Any transcriptional errors that result from this process are unintentional.

## 2013-06-26 ENCOUNTER — Other Ambulatory Visit: Payer: Self-pay | Admitting: Physical Medicine & Rehabilitation

## 2013-06-27 ENCOUNTER — Other Ambulatory Visit: Payer: Self-pay

## 2013-06-27 MED ORDER — GABAPENTIN 300 MG PO CAPS: 300.0000 mg | ORAL_CAPSULE | Freq: Three times a day (TID) | ORAL | Status: DC

## 2013-07-19 ENCOUNTER — Ambulatory Visit (HOSPITAL_BASED_OUTPATIENT_CLINIC_OR_DEPARTMENT_OTHER): Payer: BC Managed Care – PPO | Admitting: Physical Medicine & Rehabilitation

## 2013-07-19 ENCOUNTER — Encounter: Payer: Self-pay | Admitting: Physical Medicine & Rehabilitation

## 2013-07-19 ENCOUNTER — Encounter: Payer: BC Managed Care – PPO | Attending: Physical Medicine & Rehabilitation

## 2013-07-19 VITALS — BP 111/67 | HR 75 | Resp 14 | Ht 65.0 in | Wt 175.0 lb

## 2013-07-19 DIAGNOSIS — Z79899 Other long term (current) drug therapy: Secondary | ICD-10-CM | POA: Insufficient documentation

## 2013-07-19 DIAGNOSIS — I69920 Aphasia following unspecified cerebrovascular disease: Secondary | ICD-10-CM | POA: Insufficient documentation

## 2013-07-19 DIAGNOSIS — M7501 Adhesive capsulitis of right shoulder: Secondary | ICD-10-CM

## 2013-07-19 DIAGNOSIS — I69991 Dysphagia following unspecified cerebrovascular disease: Secondary | ICD-10-CM | POA: Insufficient documentation

## 2013-07-19 DIAGNOSIS — M7500 Adhesive capsulitis of unspecified shoulder: Secondary | ICD-10-CM

## 2013-07-19 DIAGNOSIS — R1310 Dysphagia, unspecified: Secondary | ICD-10-CM | POA: Insufficient documentation

## 2013-07-19 NOTE — Progress Notes (Signed)
Shoulder injection Right (with ultrasound guidance)  Indication:Right Shoulder pain not relieved by medication management and other conservative care.  Informed consent was obtained after describing risks and benefits of the procedure with the patient, this includes bleeding, bruising, infection and medication side effects. The patient wishes to proceed and has given written consent. Patient was placed in a seated position. The Right shoulder was marked and prepped with betadine in the subacromial area. A 25-gauge 1-1/2 inch needle was inserted into the subacromial area. After negative draw back for blood, a solution containing 1 mL of 6mg  per ML betamethasone and 4 mL of 1% lidocaine was injected. A band aid was applied. The patient tolerated the procedure well. Post procedure instructions were given.

## 2013-07-19 NOTE — Patient Instructions (Signed)

## 2013-08-24 ENCOUNTER — Ambulatory Visit (HOSPITAL_BASED_OUTPATIENT_CLINIC_OR_DEPARTMENT_OTHER): Payer: BC Managed Care – PPO | Admitting: Physical Medicine & Rehabilitation

## 2013-08-24 ENCOUNTER — Encounter: Payer: Self-pay | Admitting: Physical Medicine & Rehabilitation

## 2013-08-24 ENCOUNTER — Encounter: Payer: BC Managed Care – PPO | Attending: Physical Medicine & Rehabilitation

## 2013-08-24 VITALS — BP 129/70 | HR 96 | Resp 14 | Ht 66.0 in | Wt 181.0 lb

## 2013-08-24 DIAGNOSIS — I69991 Dysphagia following unspecified cerebrovascular disease: Secondary | ICD-10-CM | POA: Insufficient documentation

## 2013-08-24 DIAGNOSIS — R1310 Dysphagia, unspecified: Secondary | ICD-10-CM | POA: Insufficient documentation

## 2013-08-24 DIAGNOSIS — Z79899 Other long term (current) drug therapy: Secondary | ICD-10-CM | POA: Insufficient documentation

## 2013-08-24 DIAGNOSIS — I69920 Aphasia following unspecified cerebrovascular disease: Secondary | ICD-10-CM | POA: Insufficient documentation

## 2013-08-24 DIAGNOSIS — M7500 Adhesive capsulitis of unspecified shoulder: Secondary | ICD-10-CM

## 2013-08-24 DIAGNOSIS — G8110 Spastic hemiplegia affecting unspecified side: Secondary | ICD-10-CM

## 2013-08-24 NOTE — Patient Instructions (Signed)
Right tibial nerve block with Marcaine next visit

## 2013-08-24 NOTE — Progress Notes (Signed)
Subjective:    Patient ID: Tiffany [name], female    DOB: 07-Jul-1950, 63 y.o.   MRN: [id number]  history of CAD, HTN, TIA event a month ago; who developed difficulty speaking with right sided weakness on am of 10/16/12. She was treated with TPA. MRI brain done revealing L-MCA territory infarct affecting insula, operculum, left motor strip, superior parietal lobe, and left splenium. MRA brain with LMCA occlusion in M1 segment with no distal flow signal. She did have worsening of symptoms past admission and follow up CCT without evidence of bleed. TEE was negative for PFO, ASD or thrombus HPI Jul 19, 2013 12:37 PM EST Shoulder injection Right  (with ultrasound guidance) Improved range of motion right shoulder post injection.  Still taking hydrocodone once a day from family physician for right upper extremity pain as well as right and left lower extremity pain which started sometime after the stroke.  Also complains of back pain which preceded stroke. Saw ortho for this Pain Inventory Average Pain 4 Pain Right Now 5 My pain is intermittent and aching  In the last 24 hours, has pain interfered with the following? General activity 8 Relation with others 8 Enjoyment of life 8 What TIME of day is your pain at its worst? morning and night Sleep (in general) Fair  Pain is worse with: walking, bending, inactivity, standing and some activites Pain improves with: rest, pacing activities and medication Relief from Meds: 8  Mobility use a cane how many minutes can you walk? 3 ability to climb steps?  yes do you drive?  no Do you have any goals in this area?  yes  Function disabled: date disabled . I need assistance with the following:  dressing, bathing, meal prep, household duties and shopping  Neuro/Psych weakness numbness tingling trouble walking confusion depression  Prior Studies Any changes since last visit?  no  Physicians involved in your care Any changes since  last visit?  no   History reviewed. No pertinent family history. History   Social History  . Marital Status: Unknown    Spouse Name: Tiffany [name]    Number of Children: 1  . Years of Education: Masters   Occupational History  . disability    Social History Main Topics  . Smoking status: Former Smoker -- 0.00 packs/day    Quit date: 10/19/2012  . Smokeless tobacco: None  . Alcohol Use: No  . Drug Use: No  . Sexual Activity: No   Other Topics Concern  . None   Social History Narrative   Pt lives at home with husband Tiffany Gold([place])   Pt is right handed   Education Masters degree   Caffeine 2 cups daily   Past Surgical History  Procedure Laterality Date  . Cardiac surgery    . Tee without cardioversion N/A 10/18/2012    Procedure: TRANSESOPHAGEAL ECHOCARDIOGRAM (TEE);  Surgeon: Vesta MixerPhilip J Nahser, MD;  [facility];  Service: Cardiovascular;  Laterality: N/A;  . Coronary artery bypass graft    . Radiology with anesthesia N/A 11/09/2012    Procedure: RADIOLOGY WITH ANESTHESIA- CAROTID STENT;  Surgeon: Oneal GroutSanjeev K Deveshwar, MD;  Location: [facility] OR;  Service: Radiology;  Laterality: N/A;   Past Medical History  Diagnosis Date  . Coronary artery disease   . Hypertension   . TIA (transient ischemic attack)   . High cholesterol   . MI (myocardial infarction)   . Stroke   . Arthritis    BP 129/70  Pulse 96  Resp 14  Ht 5\' 6"  (1.676 m)  Wt 181 lb (82.101 kg)  BMI 29.23 kg/m2  SpO2 94%  Opioid Risk Score:   Fall Risk Score: Moderate Fall Risk (6-13 points) (patient educated handout given)   Review of Systems  Musculoskeletal: Positive for arthralgias, gait problem and myalgias.  Neurological: Positive for weakness.  Psychiatric/Behavioral: Positive for confusion and dysphoric mood. The patient is nervous/anxious.   All other systems reviewed and are negative.       Objective:   Physical Exam  Motor strength is 3/5 in the right deltoid, bicep, tricep,  grip Decreased internal and external rotation of the right shoulder. Decreased fine motor movements finger to thumb opposition in the right upper Positive dysdiadochokinesis in the right upper with rapid alternating supination and pronation.      Assessment & Plan:  1.  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  Has ROM improvements post injection, will repeat in 6-8 wks if needed 2.  R equinovarus spasticity will do nerve block of R tibial with marcaine, she does not have adequate relief from baclofen and she is are completed inpatient and outpatient physical therapy. If the Marcaine block is helpful she may benefit from phenol neuro lysis

## 2013-09-25 ENCOUNTER — Other Ambulatory Visit: Payer: Self-pay | Admitting: Physical Medicine & Rehabilitation

## 2013-09-27 ENCOUNTER — Encounter: Payer: Self-pay | Admitting: Physical Medicine & Rehabilitation

## 2013-09-27 ENCOUNTER — Ambulatory Visit (HOSPITAL_BASED_OUTPATIENT_CLINIC_OR_DEPARTMENT_OTHER): Payer: BC Managed Care – PPO | Admitting: Physical Medicine & Rehabilitation

## 2013-09-27 ENCOUNTER — Encounter: Payer: BC Managed Care – PPO | Attending: Physical Medicine & Rehabilitation

## 2013-09-27 VITALS — BP 119/70 | HR 104 | Resp 14 | Ht 66.0 in | Wt 187.0 lb

## 2013-09-27 DIAGNOSIS — I69920 Aphasia following unspecified cerebrovascular disease: Secondary | ICD-10-CM | POA: Insufficient documentation

## 2013-09-27 DIAGNOSIS — Z79899 Other long term (current) drug therapy: Secondary | ICD-10-CM | POA: Insufficient documentation

## 2013-09-27 DIAGNOSIS — I69991 Dysphagia following unspecified cerebrovascular disease: Secondary | ICD-10-CM | POA: Insufficient documentation

## 2013-09-27 DIAGNOSIS — G8110 Spastic hemiplegia affecting unspecified side: Secondary | ICD-10-CM

## 2013-09-27 DIAGNOSIS — R1310 Dysphagia, unspecified: Secondary | ICD-10-CM | POA: Insufficient documentation

## 2013-09-27 NOTE — Patient Instructions (Signed)
Block with phenol next visit. This will be a longer-lasting block.

## 2013-09-27 NOTE — Progress Notes (Signed)
Right tibial nerve block  Indication: Severe spasticity in the plantar flexor muscles which is not responding to medical management and other conservative care and interfering with functional use.  Informed consent was obtained after describing the risks and benefits of the procedure with the patient this includes bleeding bruising and infection as well as medication side effects. The patient elected to proceed and has given written consent. Patient placed in a prone position on the exam table. External DC stimulation was applied to the popliteal space using a nerve stimulator. Plantar flexion twitch was obtained. The popliteal region was prepped with Betadine and then entered with a 22-gauge 40 mm needle electrode under electrical stimulation guidance. Plantar flexion which was obtained and confirmed. Then 4 cc of .5% Marcaine The patient tolerated procedure well. Post procedure instructions and followup visit were given.  Patient had good relief of plantar flexor spasticity post procedure. Will be scheduled for phenol neurolysis of the right tibial nerve

## 2013-11-02 ENCOUNTER — Ambulatory Visit (HOSPITAL_BASED_OUTPATIENT_CLINIC_OR_DEPARTMENT_OTHER): Payer: BC Managed Care – PPO | Admitting: Physical Medicine & Rehabilitation

## 2013-11-02 ENCOUNTER — Encounter: Payer: Self-pay | Admitting: Physical Medicine & Rehabilitation

## 2013-11-02 ENCOUNTER — Encounter: Payer: BC Managed Care – PPO | Attending: Physical Medicine & Rehabilitation

## 2013-11-02 VITALS — BP 110/63 | HR 80 | Resp 14 | Ht 66.0 in | Wt 183.4 lb

## 2013-11-02 DIAGNOSIS — I69991 Dysphagia following unspecified cerebrovascular disease: Secondary | ICD-10-CM | POA: Insufficient documentation

## 2013-11-02 DIAGNOSIS — I69920 Aphasia following unspecified cerebrovascular disease: Secondary | ICD-10-CM | POA: Insufficient documentation

## 2013-11-02 DIAGNOSIS — R1310 Dysphagia, unspecified: Secondary | ICD-10-CM | POA: Insufficient documentation

## 2013-11-02 DIAGNOSIS — Z79899 Other long term (current) drug therapy: Secondary | ICD-10-CM | POA: Insufficient documentation

## 2013-11-02 DIAGNOSIS — G8110 Spastic hemiplegia affecting unspecified side: Secondary | ICD-10-CM

## 2013-11-02 NOTE — Progress Notes (Signed)
Right tibial nerve neurolysis  Indication: Severe spasticity in the plantar flexor muscles which is not responding to medical management and other conservative care and interfering with functional use.  Informed consent was obtained after describing the risks and benefits of the procedure with the patient this includes bleeding bruising and infection as well as medication side effects. The patient elected to proceed and has given written consent. Patient placed in a prone position on the exam table. External DC stimulation was applied to the popliteal space using a nerve stimulator. Plantar flexion twitch was obtained. The popliteal region was prepped with Betadine and then entered with a 22-gauge 40 mm needle electrode under electrical stimulation guidance. Plantar flexion which was obtained and confirmed. Then 4 cc of 5% phenol was injected The patient tolerated procedure well. Post procedure instructions and followup visit were given.  Immediately after procedure pt could ambulate without dragging toes

## 2013-11-02 NOTE — Patient Instructions (Signed)
Phenol injection will take full effect in about one week

## 2013-11-30 ENCOUNTER — Encounter: Payer: Self-pay | Admitting: Physical Medicine & Rehabilitation

## 2013-11-30 ENCOUNTER — Ambulatory Visit (HOSPITAL_BASED_OUTPATIENT_CLINIC_OR_DEPARTMENT_OTHER): Payer: BC Managed Care – PPO | Admitting: Physical Medicine & Rehabilitation

## 2013-11-30 ENCOUNTER — Encounter: Payer: BC Managed Care – PPO | Attending: Physical Medicine & Rehabilitation

## 2013-11-30 ENCOUNTER — Other Ambulatory Visit: Payer: Self-pay

## 2013-11-30 VITALS — BP 136/74 | HR 95 | Resp 14 | Ht 66.0 in | Wt 183.0 lb

## 2013-11-30 DIAGNOSIS — I69991 Dysphagia following unspecified cerebrovascular disease: Secondary | ICD-10-CM | POA: Insufficient documentation

## 2013-11-30 DIAGNOSIS — I69920 Aphasia following unspecified cerebrovascular disease: Secondary | ICD-10-CM | POA: Insufficient documentation

## 2013-11-30 DIAGNOSIS — M7501 Adhesive capsulitis of right shoulder: Secondary | ICD-10-CM | POA: Insufficient documentation

## 2013-11-30 DIAGNOSIS — M7500 Adhesive capsulitis of unspecified shoulder: Secondary | ICD-10-CM

## 2013-11-30 DIAGNOSIS — R1310 Dysphagia, unspecified: Secondary | ICD-10-CM | POA: Insufficient documentation

## 2013-11-30 DIAGNOSIS — Z79899 Other long term (current) drug therapy: Secondary | ICD-10-CM | POA: Insufficient documentation

## 2013-11-30 NOTE — Patient Instructions (Signed)
Post stroke emotional lability. Please discussed with Orlie PollenLynne at the neurology office about switching from venlafaxine to sertraline

## 2013-11-30 NOTE — Progress Notes (Addendum)
Shoulder injection (With ultrasound guidance)  Indication:Right Shoulder pain not relieved by medication management and other conservative care.  Informed consent was obtained after describing risks and benefits of the procedure with the patient, this includes bleeding, bruising, infection and medication side effects. The patient wishes to proceed and has given written consent. Patient was placed in a seated position. The Right shoulder was marked and prepped with betadine in the subacromial area. A 25-gauge 1-1/2 inch needle was inserted into glenohumeral joint. 3 cc of 1% lidocaine were infiltrate it into the skin and subcutaneous tissue. Direct long axis view utilized targeting the space between the glenoid labrum and the humeral head. After negative draw back for blood, a solution containing 1 mL of 6 mg per ML betamethasone and 4 mL of 1% lidocaine was injected. A band aid was applied. The patient tolerated the procedure well. Post procedure instructions were given.

## 2013-12-24 ENCOUNTER — Other Ambulatory Visit: Payer: Self-pay | Admitting: Physical Medicine & Rehabilitation

## 2013-12-24 ENCOUNTER — Ambulatory Visit: Payer: BC Managed Care – PPO | Admitting: Nurse Practitioner

## 2013-12-28 ENCOUNTER — Ambulatory Visit: Payer: Self-pay | Admitting: Nurse Practitioner

## 2013-12-28 ENCOUNTER — Telehealth: Payer: Self-pay | Admitting: Nurse Practitioner

## 2013-12-28 NOTE — Telephone Encounter (Signed)
Patient was no show for today's office appointment.  

## 2014-01-08 ENCOUNTER — Encounter: Payer: Self-pay | Admitting: Nurse Practitioner

## 2014-01-08 ENCOUNTER — Ambulatory Visit (INDEPENDENT_AMBULATORY_CARE_PROVIDER_SITE_OTHER): Payer: BC Managed Care – PPO | Admitting: Nurse Practitioner

## 2014-01-08 VITALS — BP 128/74 | HR 80 | Ht 66.0 in | Wt 186.0 lb

## 2014-01-08 DIAGNOSIS — R4586 Emotional lability: Secondary | ICD-10-CM

## 2014-01-08 DIAGNOSIS — G8111 Spastic hemiplegia affecting right dominant side: Secondary | ICD-10-CM

## 2014-01-08 DIAGNOSIS — G8110 Spastic hemiplegia affecting unspecified side: Secondary | ICD-10-CM

## 2014-01-08 DIAGNOSIS — IMO0002 Reserved for concepts with insufficient information to code with codable children: Secondary | ICD-10-CM

## 2014-01-08 DIAGNOSIS — F3289 Other specified depressive episodes: Secondary | ICD-10-CM

## 2014-01-08 DIAGNOSIS — I6350 Cerebral infarction due to unspecified occlusion or stenosis of unspecified cerebral artery: Secondary | ICD-10-CM

## 2014-01-08 DIAGNOSIS — F329 Major depressive disorder, single episode, unspecified: Secondary | ICD-10-CM

## 2014-01-08 MED ORDER — SERTRALINE HCL 25 MG PO TABS: 25.0000 mg | ORAL_TABLET | Freq: Every day | ORAL | Status: DC

## 2014-01-08 MED ORDER — VENLAFAXINE HCL ER 37.5 MG PO CP24: ORAL_CAPSULE | ORAL | Status: DC

## 2014-01-08 NOTE — Progress Notes (Signed)
PATIENT: Tiffany [name]. Panjwani DOB: July 29, 1950  REASON FOR VISIT: routine follow up for stroke HISTORY FROM: patient  HISTORY OF PRESENT ILLNESS: 63 year Caucasian lady seen for first office f/u visit after [facility] admission on 10/16/12 With acute onset aphasia and right hemiparesis and right facial droop.NIHSS was 13 on admission and she was treated with iv TPA . CT head subsequently showed subtle hypodensity in left periinsular region without hemorrhage . MRI showed left insular and opercular cortex infarct with mild cytotoxic edema.MRA brain showed occluded left m 1 MCA.Transthoraxic echo showed normal ejection fraction.TEE showed no clot or PFO.Carotid dopplers showed left ICA occlusion and greater than 80% RICA stenosis.She has h/o transient right sided weakness in March 2014 and saw Dr Lendell Caprice a neurologist in [place] who did outpatient MRi showing small left brain infarct. MRA neck was ordered as outpatient and results were pending at the time of her stroke on 10/16/12. Vascular risk factors identified included HTN, Hyperlipidimia,CAD,Obesity, carotid occlusion and TIA.Patient was seen by therapies and transferred to inpatient rehab and has done well with good recovery of strength and speech except when she is tired when she gets words hesitancy and dragging of right leg transiently. 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 She has been compliant with her medicines and and has finished her therapy sessions. Her blood pressure is good and is 124/68 today.She walks with a cane with good balance and no recent falls. She underwent elective right ICA stenting by Dr Corliss Skains on 11/09/12 and is tolerating aspirin and plavix but has increased bruising but no bleeding.  UPDATE 06/25/13 (LL): Patient returns to office for stroke follow up. Carotid duplex completed on 03/19/13 showing Right: Stent patent. No evidence of restenosis. Left:  Occluded internal carotid artery. She has improved on the use of her right hand and right leg; is continuing to do exercises learned in therapies. She is walking short distnaces without cane. Plans to start water aerobics. She lacks fine motor control in right hand but it is getting better. Her speech is improved as well, still with expressive deficits. She has considerable depression post stroke, mourning the loss of freedom and traveling full-time job. She used to take Effexor XR 150 mg but was reduced to 75 mg daily after first TIA's occurred in April and was never switched back to 150 mg daily dose after hospitalization. She has not returned to driving, adding to her depression. She has had 2 injections to frozen shoulder and is due for a third with Dr. Larna Daughters. Bp is well controlled; is 133/86 in office today. She is tolerating Plavix and aspirin with mild bruising, no bleeding.   UPDATE 01/08/14 (LL):   Since last visit, she has had continued emotional lability, even with increased dose of Venlafaxine.  She cries very easily, sometimes for no apparent reason.  Denies inappropriate laughter. Walking without cane now, dragging right foot less.  Denies any falls. Has not been able to return to driving due to weakness of right leg, also sensory loss.  She is tolerating Plavix and aspirin with mild bruising, no bleeding.  Blood pressure is well controlled, is 128/74 in the office today.  Continues to follow up with Dr. Wynn Banker, shoulder injections have helped.  ROS:  14 system review of systems is positive for weight gain, fatigue, urination problems, aching muscles, slurred speech, depression, anxiety, too much sleep, decreased energy, allergies, running nose, memory loss,  numbness, weakness, slurred speech, tremor, sleepiness  ALLERGIES: Allergies  Allergen Reactions  . Codeine Nausea Only    HOME MEDICATIONS: Outpatient Prescriptions Prior to Visit  Medication Sig Dispense Refill  . aspirin EC  325 MG tablet Take 325 mg by mouth daily.      Marland Kitchen. atorvastatin (LIPITOR) 40 MG tablet Take 80 mg by mouth daily.       . baclofen (LIORESAL) 10 MG tablet TAKE 1/2 TABLET AT BEDTIME  15 tablet  3  . cetirizine (ZYRTEC) 10 MG tablet Take 10 mg by mouth daily.      . clonazePAM (KLONOPIN) 0.5 MG tablet Take 0.5 mg by mouth 2 (two) times daily as needed for anxiety.       . clopidogrel (PLAVIX) 75 MG tablet Take 1 tablet (75 mg total) by mouth daily with breakfast.  30 tablet  1  . gabapentin (NEURONTIN) 300 MG capsule TAKE ONE (1) CAPSULE THREE (3) TIMES EACH DAY  90 capsule  3  . HYDROcodone-acetaminophen (NORCO/VICODIN) 5-325 MG per tablet       . metoprolol (LOPRESSOR) 50 MG tablet Take 0.5 tablets (25 mg total) by mouth 2 (two) times daily.      . mometasone (NASONEX) 50 MCG/ACT nasal spray Place 2 sprays into the nose daily.      . traZODone (DESYREL) 25 mg TABS Take 0.5-1 tablets (25-50 mg total) by mouth at bedtime as needed for sleep.  30 tablet  1  . venlafaxine XR (EFFEXOR XR) 150 MG 24 hr capsule Take 1 capsule (150 mg total) by mouth daily with breakfast.  30 capsule  5   PHYSICAL EXAM Filed Vitals:   01/08/14 1537  BP: 128/74  Pulse: 80  Height: 5\' 6"  (1.676 m)  Weight: 186 lb (84.369 kg)   Body mass index is 30.04 kg/(m^2).  Physical Exam  General: well developed, well nourished, seated, in no evident distress  Head: head normocephalic and atraumatic. Orohparynx benign  Neck: supple with no carotid or supraclavicular bruits  Cardiovascular: regular rate and rhythm, no murmurs  Musculoskeletal: no deformity   Neurologic Exam  Mental Status: Awake and fully alert. Oriented to place and time. Recent and remote memory intact. Attention span, concentration and fund of knowledge appropriate. Mood labile. Occasional word finding difficulties.  Cranial Nerves: Pupils equal, briskly reactive to light. Extraocular movements full without nystagmus. Visual fields full to confrontation.  Hearing intact. Mild right face asymmetry. Facial sensation intact. Face, tongue, palate moves normally and symmetrically.  Motor: Normal bulk and tone on left. Mild right hemiparesis 3/5 with spasticity and increase tone on right shoulder. Dimishded fine finger movements on right.  Sensory: intact to touch and pinprick.  Coordination: Rapid alternating movements decreased on right side. Finger-to-nose and heel-to-shin performed accurately bilaterally.  Gait and Station: Arises from chair without difficulty. Stance is normal. Gait demonstrates spastic hemiplegic gait with stiffness of right leg. Romberg negative. Reflexes: 2+ and asymmetric and brisker on right. Toes downgoing.   ASSESSMENT AND PLAN  63 year-old lady with left frontal MCA branch infarct in April 2014 secondary to thromboembolism from proximal left ICA occlusion treated with iv TPA with good improvement. Right ICA greater than 80 % RICA stenosis s/p PTA/stenting by Dr Corliss Skainseveshwar on 11/09/12. Residual right sided hemiparesis, mild expressive aphasia, and post-stroke depression, emotional lability.   PLAN: Venlafaxine is not helping with her emotional lability.  Plan to wean off and start Sertraline. Wean Venlafaxine: I have sent a new prescription to the  pharmacy of 37.5 mg capsules. Start taking 3 capsules each day. After 1 week, reduce to 2 capsules each day. After 1 week, reduce to 1 capsule each day. After 1 more week, stop. Start Sertraline 25 mg daily when you get to taking 2 capsules of Venlafaxine each day.  Continue Plavix and aspirin for stroke prevention (per Dr. Corliss Skains) and strict control of lipids with LDL cholesterol goal below 100 mg percent and hypertension with blood pressure goal below 140/90.  Continue followup with Dr. Sharion Settler for a right frozen shoulder treatment.  Return for followup in 2 months with Larita Fife, NP. Call sooner with any questions or problems.  Meds ordered this encounter  Medications  .  venlafaxine XR (EFFEXOR XR) 37.5 MG 24 hr capsule    Sig: Start taking 3 capsules each day. After 1 week, reduce to 2 capsules each day. After 1 week, reduce to 1 capsule each day. After 1 more week, stop.    Dispense:  90 capsule    Refill:  0    Order Specific Question:  Supervising Provider    Answer:  Pearlean Brownie, PRAMOD [[id number]]  . sertraline (ZOLOFT) 25 MG tablet    Sig: Take 1 tablet (25 mg total) by mouth at bedtime.    Dispense:  30 tablet    Refill:  2    Order Specific Question:  Supervising Provider    Answer:  Delia Heady [[id number]]   Return in about 2 months (around 03/11/2014) for stroke followup, depression.  Tawny Asal LAM, MSN, FNP-BC, A/GNP-C 01/08/2014, 4:58 PM [facility] [address], [address] [place], [place] [address] [phone]  Note: This document was prepared with digital dictation and possible smart phrase technology. Any transcriptional errors that result from this process are unintentional.

## 2014-01-08 NOTE — Patient Instructions (Addendum)
To wean Venlafaxine: I have sent a new prescription to the pharmacy of 37.5 mg capsules. Start taking 3 capsules each day. After 1 week, reduce to 2 capsules each day. After 1 week, reduce to 1 capsule each day. After 1 more week, stop.  Start Sertraline 25 mg daily when you get to taking 2 capsules of Venlafaxine each day.  Continue Plavix and aspirin for stroke prevention (per Dr. Corliss Skainseveshwar) and strict control of lipids with LDL cholesterol goal below 100 mg percent and hypertension with blood pressure goal below 140/90.  Continue followup with Dr. Sharion SettlerKisteins for a right frozen shoulder treatment.  Return for followup in 3 months with Larita FifeLynn, NP

## 2014-01-08 NOTE — Progress Notes (Signed)
I agree with the above plan 

## 2014-01-14 ENCOUNTER — Ambulatory Visit: Payer: BC Managed Care – PPO | Admitting: Physical Medicine & Rehabilitation

## 2014-01-14 ENCOUNTER — Encounter: Payer: BC Managed Care – PPO | Attending: Physical Medicine & Rehabilitation

## 2014-01-14 DIAGNOSIS — R1310 Dysphagia, unspecified: Secondary | ICD-10-CM | POA: Insufficient documentation

## 2014-01-14 DIAGNOSIS — I69991 Dysphagia following unspecified cerebrovascular disease: Secondary | ICD-10-CM | POA: Insufficient documentation

## 2014-01-14 DIAGNOSIS — Z79899 Other long term (current) drug therapy: Secondary | ICD-10-CM | POA: Insufficient documentation

## 2014-01-14 DIAGNOSIS — I69920 Aphasia following unspecified cerebrovascular disease: Secondary | ICD-10-CM | POA: Insufficient documentation

## 2014-02-26 ENCOUNTER — Encounter: Payer: Self-pay | Admitting: Physical Medicine & Rehabilitation

## 2014-02-26 ENCOUNTER — Ambulatory Visit (HOSPITAL_BASED_OUTPATIENT_CLINIC_OR_DEPARTMENT_OTHER): Payer: BC Managed Care – PPO | Admitting: Physical Medicine & Rehabilitation

## 2014-02-26 ENCOUNTER — Encounter: Payer: BC Managed Care – PPO | Attending: Physical Medicine & Rehabilitation

## 2014-02-26 VITALS — BP 131/41 | HR 91 | Resp 16 | Ht 66.0 in | Wt 183.0 lb

## 2014-02-26 DIAGNOSIS — I69991 Dysphagia following unspecified cerebrovascular disease: Secondary | ICD-10-CM | POA: Diagnosis not present

## 2014-02-26 DIAGNOSIS — I69920 Aphasia following unspecified cerebrovascular disease: Secondary | ICD-10-CM | POA: Diagnosis not present

## 2014-02-26 DIAGNOSIS — I69998 Other sequelae following unspecified cerebrovascular disease: Secondary | ICD-10-CM

## 2014-02-26 DIAGNOSIS — G8110 Spastic hemiplegia affecting unspecified side: Secondary | ICD-10-CM

## 2014-02-26 DIAGNOSIS — R1310 Dysphagia, unspecified: Secondary | ICD-10-CM | POA: Diagnosis not present

## 2014-02-26 DIAGNOSIS — Z79899 Other long term (current) drug therapy: Secondary | ICD-10-CM | POA: Diagnosis not present

## 2014-02-26 DIAGNOSIS — R209 Unspecified disturbances of skin sensation: Secondary | ICD-10-CM

## 2014-02-26 MED ORDER — BACLOFEN 10 MG PO TABS: 10.0000 mg | ORAL_TABLET | Freq: Two times a day (BID) | ORAL | Status: DC

## 2014-02-26 NOTE — Patient Instructions (Signed)
Increase baclofen 10 mg twice a day Take gabapentin 3 or milligrams 3 times per day  Will do tibial nerve block repeat if above is not helpful

## 2014-02-26 NOTE — Progress Notes (Signed)
Subjective:    Patient ID: Tiffany [name]. Patnaude, female    DOB: Apr 10, 1951, 63 y.o.   MRN: [id number] Fri Dec 07, 2013 12:42 PM EDT Right ultrasound guided shoulder injection inter articular  09/27/2013 Right tibial nerve block HPI  Right foot pain 1-2 months duration. No trauma to that area. She did have a right tibial nerve block in April and this seemed to help spasms Pain Inventory Average Pain 5 Pain Right Now 5 My pain is intermittent, sharp, burning, stabbing and aching  In the last 24 hours, has pain interfered with the following? General activity 5 Relation with others 2 Enjoyment of life 5 What TIME of day is your pain at its worst? evening Sleep (in general) Fair  Pain is worse with: walking and standing Pain improves with: medication Relief from Meds: 6  Mobility walk without assistance use a cane how many minutes can you walk? 5 ability to climb steps?  yes do you drive?  no Do you have any goals in this area?  yes  Function disabled: date disabled na I need assistance with the following:  meal prep, household duties and shopping  Neuro/Psych depression anxiety  Prior Studies Any changes since last visit?  no  Physicians involved in your care Any changes since last visit?  no   History reviewed. No pertinent family history. History   Social History  . Marital Status: Married    Spouse Name: Perlie Gold    Number of Children: 1  . Years of Education: Masters   Occupational History  . disability    Social History Main Topics  . Smoking status: Former Smoker -- 2.00 packs/day for 25 years    Types: Cigarettes    Quit date: 10/19/2012  . Smokeless tobacco: Never Used  . Alcohol Use: No  . Drug Use: No  . Sexual Activity: No   Other Topics Concern  . None   Social History Narrative   Pt lives at home with husband Perlie Gold)   Pt is right handed   Education Masters degree   Caffeine 2 cups daily   Past Surgical History  Procedure  Laterality Date  . Cardiac surgery    . Tee without cardioversion N/A 10/18/2012    Procedure: TRANSESOPHAGEAL ECHOCARDIOGRAM (TEE);  Surgeon: Vesta Mixer, MD;  Location: [facility] ENDOSCOPY;  Service: Cardiovascular;  Laterality: N/A;  . Coronary artery bypass graft    . Radiology with anesthesia N/A 11/09/2012    Procedure: RADIOLOGY WITH ANESTHESIA- CAROTID STENT;  Surgeon: Oneal Grout, MD;  Location: [facility] OR;  Service: Radiology;  Laterality: N/A;   Past Medical History  Diagnosis Date  . Coronary artery disease   . Hypertension   . TIA (transient ischemic attack)   . High cholesterol   . MI (myocardial infarction)   . Stroke   . Arthritis    BP 131/41  Pulse 91  Resp 16  Ht  (1.676 m)  Wt 183 lb (83.008 kg)  BMI 29.55 kg/m2  SpO2 97%  Opioid Risk Score:   Fall Risk Score:      Review of Systems  Psychiatric/Behavioral: The patient is nervous/anxious.        Depression   All other systems reviewed and are negative.      Objective:   Physical Exam  Ambulates with mild equinovarus positioning of the right foot. Does have toe curling with weight-bearing particularly digits 2345 2 beats of clonus at the right ankle No swelling or tenderness along  the ankle joint. No pain with ankle range of motion Her dorsalis pedis and tibial pulses are intact No evidence of erythema or allodynia     Assessment & Plan:  #1. 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 This is continuing to pain. 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 Will increase baclofen to 10 mg twice a day Gabapentin 300 mg 3 times per day, the patient states she's been taking in only twice a day Will repeat tibial nerve block if this is not helpful

## 2014-03-18 ENCOUNTER — Ambulatory Visit: Payer: BC Managed Care – PPO | Admitting: Nurse Practitioner

## 2014-03-18 ENCOUNTER — Telehealth: Payer: Self-pay | Admitting: Nurse Practitioner

## 2014-03-18 NOTE — Telephone Encounter (Signed)
Called patient to r/s appointment no answer left message to r/s appointment with NP LL.

## 2014-03-29 ENCOUNTER — Other Ambulatory Visit: Payer: Self-pay | Admitting: Physical Medicine & Rehabilitation

## 2014-03-29 DIAGNOSIS — M62838 Other muscle spasm: Secondary | ICD-10-CM

## 2014-04-04 ENCOUNTER — Ambulatory Visit: Payer: BC Managed Care – PPO | Admitting: Physical Medicine & Rehabilitation

## 2014-04-08 ENCOUNTER — Encounter: Payer: BC Managed Care – PPO | Attending: Physical Medicine & Rehabilitation

## 2014-04-08 ENCOUNTER — Encounter: Payer: Self-pay | Admitting: Physical Medicine & Rehabilitation

## 2014-04-08 ENCOUNTER — Ambulatory Visit (HOSPITAL_BASED_OUTPATIENT_CLINIC_OR_DEPARTMENT_OTHER): Payer: BC Managed Care – PPO | Admitting: Physical Medicine & Rehabilitation

## 2014-04-08 VITALS — BP 134/70 | HR 92 | Resp 14 | Wt 182.6 lb

## 2014-04-08 DIAGNOSIS — G8110 Spastic hemiplegia affecting unspecified side: Secondary | ICD-10-CM

## 2014-04-08 MED ORDER — SERTRALINE HCL 25 MG PO TABS: 25.0000 mg | ORAL_TABLET | Freq: Two times a day (BID) | ORAL | Status: DC

## 2014-04-08 NOTE — Progress Notes (Signed)
Phenol neurolysis of the right tibial nerve  Indication: Severe spasticity in the plantar flexor muscles which is not responding to medical management and other conservative care and interfering with functional use.  Informed consent was obtained after describing the risks and benefits of the procedure with the patient this includes bleeding bruising and infection as well as medication side effects. The patient elected to proceed and has given written consent. Patient placed in a prone position on the exam table. External DC stimulation was applied to the popliteal space using a nerve stimulator. Plantar flexion twitch was obtained. The popliteal region was prepped with Betadine and then entered with a 22-gauge 40 mm needle electrode under electrical stimulation guidance. Plantar flexion which was obtained and confirmed. Then 4 cc of 5% phenol were injected. The patient tolerated procedure well. Post procedure instructions and followup visit were given. 

## 2014-04-18 ENCOUNTER — Ambulatory Visit: Payer: BC Managed Care – PPO | Admitting: Nurse Practitioner

## 2014-04-22 ENCOUNTER — Encounter: Payer: Self-pay | Admitting: *Deleted

## 2014-04-25 ENCOUNTER — Ambulatory Visit: Payer: BC Managed Care – PPO | Admitting: Nurse Practitioner

## 2014-04-25 ENCOUNTER — Telehealth: Payer: Self-pay | Admitting: Nurse Practitioner

## 2014-04-25 NOTE — Telephone Encounter (Signed)
Left message for patient to call back and reschedule today's appointment due to her still having diarrhea.

## 2014-04-25 NOTE — Telephone Encounter (Signed)
Patient was no show for today's office appointment.  

## 2014-04-30 ENCOUNTER — Encounter: Payer: Self-pay | Admitting: *Deleted

## 2014-05-08 ENCOUNTER — Encounter: Payer: Self-pay | Admitting: Neurology

## 2014-05-09 ENCOUNTER — Encounter: Payer: Self-pay | Admitting: Nurse Practitioner

## 2014-05-09 ENCOUNTER — Ambulatory Visit (INDEPENDENT_AMBULATORY_CARE_PROVIDER_SITE_OTHER): Payer: BC Managed Care – PPO | Admitting: Nurse Practitioner

## 2014-05-09 VITALS — BP 124/71 | HR 61 | Temp 97.9°F | Ht 66.0 in | Wt 178.0 lb

## 2014-05-09 DIAGNOSIS — R4701 Aphasia: Secondary | ICD-10-CM

## 2014-05-09 DIAGNOSIS — IMO0002 Reserved for concepts with insufficient information to code with codable children: Secondary | ICD-10-CM

## 2014-05-09 DIAGNOSIS — G8110 Spastic hemiplegia affecting unspecified side: Secondary | ICD-10-CM

## 2014-05-09 DIAGNOSIS — I6350 Cerebral infarction due to unspecified occlusion or stenosis of unspecified cerebral artery: Secondary | ICD-10-CM

## 2014-05-09 DIAGNOSIS — R4586 Emotional lability: Secondary | ICD-10-CM

## 2014-05-09 DIAGNOSIS — F329 Major depressive disorder, single episode, unspecified: Secondary | ICD-10-CM

## 2014-05-09 DIAGNOSIS — G8111 Spastic hemiplegia affecting right dominant side: Secondary | ICD-10-CM

## 2014-05-09 MED ORDER — DEXTROMETHORPHAN-QUINIDINE 20-10 MG PO CAPS: 1.0000 | ORAL_CAPSULE | Freq: Two times a day (BID) | ORAL | Status: DC

## 2014-05-09 MED ORDER — SERTRALINE HCL 25 MG PO TABS: 50.0000 mg | ORAL_TABLET | Freq: Two times a day (BID) | ORAL | Status: DC

## 2014-05-09 MED ORDER — MOMETASONE FUROATE 50 MCG/ACT NA SUSP: 2.0000 | Freq: Every day | NASAL | Status: DC

## 2014-05-09 MED ORDER — DEXTROMETHORPHAN-QUINIDINE 20-10 MG PO CAPS: ORAL_CAPSULE | ORAL | Status: DC

## 2014-05-09 NOTE — Progress Notes (Signed)
PATIENT: Tiffany [name] DOB: [birth year]  REASON FOR VISIT: routine follow up for stroke HISTORY FROM: patient and daughter  HISTORY OF PRESENT ILLNESS: [age] year Caucasian lady seen for first office f/u visit [facility]12 With acute onset aphasia and right hemiparesis and right facial droop.NIHSS was 13 on admission and she was treated with iv TPA . CT head subsequently showed subtle hypodensity in left periinsular region without hemorrhage . MRI showed left insular and opercular cortex infarct with mild cytotoxic edema.MRA brain showed occluded left m 1 MCA.Transthoraxic echo showed normal ejection fraction.TEE showed no clot or PFO.Carotid dopplers showed left ICA occlusion and greater than 80% RICA stenosis.She has h/o transient right sided weakness in March 2014 and saw Dr Lendell CapriceSullivan a neurologist in [place] who did outpatient MRi showing small left brain infarct. MRA neck was ordered as outpatient and results were pending at the time of her stroke on 10/16/12. Vascular risk factors identified included HTN, Hyperlipidimia,CAD,Obesity, carotid occlusion and TIA.Patient was seen by therapies and transferred to inpatient rehab and has done well with good recovery of strength and speech except when she is tired when she gets words hesitancy and dragging of right leg transiently. 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 She has been compliant with her medicines and and has finished her therapy sessions. Her blood pressure is good and is 124/68 today.She walks with a cane with good balance and no recent falls. She underwent elective right ICA stenting by Dr Corliss Skainseveshwar on 11/09/12 and is tolerating aspirin and plavix but has increased bruising but no bleeding.  UPDATE 06/25/13 (LL): Patient returns to office for stroke follow up. Carotid duplex completed on 03/19/13 showing Right: Stent patent. No evidence of  restenosis. Left: Occluded internal carotid artery. She has improved on the use of her right hand and right leg; is continuing to do exercises learned in therapies. She is walking short distnaces without cane. Plans to start water aerobics. She lacks fine motor control in right hand but it is getting better. Her speech is improved as well, still with expressive deficits. She has considerable depression post stroke, mourning the loss of freedom and traveling full-time job. She used to take Effexor XR 150 mg but was reduced to 75 mg daily after first TIA's occurred in April and was never switched back to 150 mg daily dose after hospitalization. She has not returned to driving, adding to her depression. She has had 2 injections to frozen shoulder and is due for a third with Dr. Larna DaughtersKirstens. Bp is well controlled; is 133/86 in office today. She is tolerating Plavix and aspirin with mild bruising, no bleeding.   UPDATE 01/08/14 (LL):   Since last visit, she has had continued emotional lability, even with increased dose of Venlafaxine.  She cries very easily, sometimes for no apparent reason.  Denies inappropriate laughter. Walking without cane now, dragging right foot less.  Denies any falls. Has not been able to return to driving due to weakness of right leg, also sensory loss.  She is tolerating Plavix and aspirin with mild bruising, no bleeding. Blood pressure is well controlled, is 128/74 in the office today.  Continues to follow up with Dr. Wynn BankerKirsteins, shoulder injections have helped.  UPDATE 05/09/14 (LL): Since last visit, she has had more difficulty with walking, and has fallen twice recently.  She is now using a rollator walker for assistance. She says  her depression is worse, her daughter agrees, and she increased her sertraline to 25 mg in the morning and night. She has some days of diarrhea, but it is not consistently every day. Her daughter states she doesn't eat like she should and that her elimination  pattern has always been variable, questionably IBS. She has been following up with Dr. Wynn BankerKirsteins for her right foot and right shoulder. She is tolerating Plavix well with no signs of significant bleeding or bruising. Blood pressure is well controlled, it is 124/71 in the office today.   REVIEW OF SYSTEMS: Full 14 system review of systems performed and notable only for: runny nose, drooling, urination problems, aching muscles, back pain, muscle cramps, walking difficulty, slurred speech, depression, anxiety, too much sleep, decreased energy, numbness, weakness, dizziness, headache, tremors,  Sleepiness, cough, blurred vision, eye discharge, diarrhea   ALLERGIES: Allergies  Allergen Reactions  . Codeine Nausea Only    HOME MEDICATIONS: Outpatient Prescriptions Prior to Visit  Medication Sig Dispense Refill  . aspirin EC 325 MG tablet Take 325 mg by mouth daily.    Marland Kitchen. atorvastatin (LIPITOR) 40 MG tablet Take 80 mg by mouth daily.     . baclofen (LIORESAL) 10 MG tablet TAKE ONE TABLET TWICE DAILY 60 each 2  . cetirizine (ZYRTEC) 10 MG tablet Take 10 mg by mouth daily.    . clonazePAM (KLONOPIN) 0.5 MG tablet Take 0.5 mg by mouth 2 (two) times daily as needed for anxiety.     . clopidogrel (PLAVIX) 75 MG tablet Take 1 tablet (75 mg total) by mouth daily with breakfast. 30 tablet 1  . gabapentin (NEURONTIN) 300 MG capsule TAKE ONE (1) CAPSULE THREE (3) TIMES EACH DAY 90 capsule 3  . metoprolol (LOPRESSOR) 50 MG tablet Take 0.5 tablets (25 mg total) by mouth 2 (two) times daily.    . sertraline (ZOLOFT) 25 MG tablet Take 1 tablet (25 mg total) by mouth 2 (two) times daily. 60 tablet 2  . traZODone (DESYREL) 25 mg TABS Take 0.5-1 tablets (25-50 mg total) by mouth at bedtime as needed for sleep. 30 tablet 1  . mometasone (NASONEX) 50 MCG/ACT nasal spray Place 2 sprays into the nose daily.     Facility-Administered Medications Prior to Visit  Medication Dose Route Frequency Provider Last Rate Last  Dose  . fentaNYL (SUBLIMAZE) injection 25-50 mcg  25-50 mcg Intravenous Q5 min PRN Bedelia PersonLee Kasik, MD        PHYSICAL EXAM Filed Vitals:   05/09/14 1431  BP: 124/71  Pulse: 61  Temp: 97.9 F (36.6 C)  TempSrc: Oral  Height: 5\' 6"  (1.676 m)  Weight: 178 lb (80.74 kg)   Body mass index is 28.74 kg/(m^2).  ROS:  14 system review of systems is positive for weight gain, fatigue, urination problems, aching muscles, slurred speech, depression, anxiety, too much sleep, decreased energy, allergies, running nose, memory loss, numbness, weakness, slurred speech, tremor, sleepiness Physical Exam   General: well developed, well nourished, seated, in no evident distress   Head: head normocephalic and atraumatic. Orohparynx benign   Neck: supple with no carotid or supraclavicular bruits   Cardiovascular: regular rate and rhythm, no murmurs   Musculoskeletal: no deformity   Neurologic Exam   Mental Status: Awake and fully alert. Oriented to place and time. Recent and remote memory intact. Attention span, concentration and fund of knowledge appropriate. Mood labile. Occasional word finding difficulties.   Cranial Nerves: Pupils equal, briskly reactive to light. Extraocular movements full without  nystagmus. Visual fields full to confrontation. Hearing intact. Mild right face asymmetry. Facial sensation intact. Face, tongue, palate moves normally and symmetrically.   Motor: Normal bulk and tone on left. Mild right hemiparesis 3/5 with spasticity and increase tone on right shoulder. Dimishded fine finger movements on right.   Sensory: intact to touch and pinprick.   Coordination: Rapid alternating movements decreased on right side. Finger-to-nose and heel-to-shin performed accurately bilaterally.   Gait and Station: Arises from chair without difficulty. Stance is normal. Gait demonstrates spastic hemiplegic gait with stiffness of right leg. Romberg negative. Reflexes: 2+ and asymmetric and brisker on right.  Toes downgoing.   ASSESSMENT: 63 year-old lady with left frontal MCA branch infarct in April 2014 secondary to thromboembolism from proximal left ICA occlusion treated with iv TPA with good improvement. Right ICA greater than 80 % RICA stenosis s/p PTA/stenting by Dr Corliss Skains on 11/09/12. Residual right sided hemiparesis, mild expressive aphasia, and post-stroke depression, emotional lability.   PLAN:  Continue Plavix and aspirin for stroke prevention (per Dr. Corliss Skains) and strict control of lipids with LDL cholesterol goal below 100 mg percent and hypertension with blood pressure goal below 140/90.   Continue followup with Dr. Sharion Settler for a right frozen shoulder treatment.   Increase Sertraline to 50 mg morning and night, (2 tablets morning and 2 tablets at night.) . If depression is not better controlled in 2 months, she agrees to see her PCP. Start Nuedexta, 1 tablet each day for 1 week and then 1 tablet twice daily.  If this is helpful for crying, then continue, I am giving you a printed prescription with a co-pay card. If it does not help after you are taking it twice daily, discontinue. Return for followup in 6 months with Dr. Pearlean Brownie. Call sooner with any questions or problems.  Meds ordered this encounter  Medications  . Dextromethorphan-Quinidine 20-10 MG CAPS    Sig: Take 1 capsule by mouth every 12 (twelve) hours.    Dispense:  60 capsule    Refill:  11    Order Specific Question:  Supervising Provider    Answer:  Pearlean Brownie, PRAMOD [[id number]]  . sertraline (ZOLOFT) 25 MG tablet    Sig: Take 2 tablets (50 mg total) by mouth 2 (two) times daily.    Dispense:  120 tablet    Refill:  5    Order Specific Question:  Supervising Provider    Answer:  Pearlean Brownie, PRAMOD [[id number]]  . mometasone (NASONEX) 50 MCG/ACT nasal spray    Sig: Place 2 sprays into the nose daily.    Dispense:  17 g    Refill:  5    Order Specific Question:  Supervising Provider    Answer:  Pearlean Brownie, PRAMOD [[id number]]  .  Dextromethorphan-Quinidine 20-10 MG CAPS    Sig: Take 1 capsule daily for 7 days, then take 1 capsule every 12 hours.    Dispense:  118 capsule    Refill:  0    6 boxes SAMPLES given.  EXP 04/2014. [facility]    Order Specific Question:  Supervising Provider    Answer:  Delia Heady [[id number]]   Tawny Asal LAM, MSN, FNP-BC, A/GNP-C 05/10/2014, 10:25 AM [facility] [address], [address] [place], [place] [address] [phone]  Note: This document was prepared with digital dictation and possible smart phrase technology. Any transcriptional errors that result from this process are unintentional.

## 2014-05-09 NOTE — Patient Instructions (Addendum)
Increase Sertraline to 50 mg morning and night, (2 tablets morning and 2 tablets at night.)    Start Nuedexta, 1 tablet each day for 1 week and then 1 tablet twice daily.  If this is helpful for crying, then continue, I am giving you a prescription.  If it does not help after you are taking it twice daily, stop it.   Continue Plavix and aspirin for stroke prevention (per Dr. Corliss Skainseveshwar) and strict control of lipids with LDL cholesterol goal below 100 mg percent and hypertension with blood pressure goal below 140/90.   Continue followup with Dr. Sharion SettlerKisteins for a right frozen shoulder treatment.   Return for followup in 6 months with Dr. Pearlean BrownieSethi. Call sooner with any questions or problems.

## 2014-05-10 ENCOUNTER — Encounter: Payer: Self-pay | Admitting: Nurse Practitioner

## 2014-05-11 NOTE — Progress Notes (Signed)
I agree with the above plan 

## 2014-05-14 ENCOUNTER — Encounter: Payer: Self-pay | Admitting: Neurology

## 2014-05-20 ENCOUNTER — Ambulatory Visit (HOSPITAL_BASED_OUTPATIENT_CLINIC_OR_DEPARTMENT_OTHER): Payer: BC Managed Care – PPO | Admitting: Physical Medicine & Rehabilitation

## 2014-05-20 ENCOUNTER — Encounter: Payer: BC Managed Care – PPO | Attending: Physical Medicine & Rehabilitation

## 2014-05-20 ENCOUNTER — Encounter: Payer: Self-pay | Admitting: Physical Medicine & Rehabilitation

## 2014-05-20 VITALS — BP 128/52 | HR 68 | Resp 14 | Wt 183.0 lb

## 2014-05-20 DIAGNOSIS — G8110 Spastic hemiplegia affecting unspecified side: Secondary | ICD-10-CM

## 2014-05-20 DIAGNOSIS — R4586 Emotional lability: Secondary | ICD-10-CM | POA: Diagnosis not present

## 2014-05-20 NOTE — Progress Notes (Signed)
Subjective:    Patient ID: Tiffany [name], female    DOB: 1951/06/19, 63 y.o.   MRN: [id number]  HPI Complains of big toe extension  Increased since phenol injection  Patient feels that this may be throwing off her balance  Has been started onDextromethorphan-Quinidine 20-10 MG CAPS  Improvements with uncontrolled crying or laughing Pain Inventory Average Pain 5 Pain Right Now 5 My pain is sharp, burning, dull, stabbing, tingling and aching  In the last 24 hours, has pain interfered with the following? General activity 8 Relation with others 8 Enjoyment of life 8 What TIME of day is your pain at its worst? morning Sleep (in general) Good  Pain is worse with: walking, bending, sitting and standing Pain improves with: rest and medication Relief from Meds: 5  Mobility use a cane use a walker how many minutes can you walk? <5 ability to climb steps?  yes do you drive?  no  Function disabled: date disabled . retired  Neuro/Psych weakness tingling trouble walking confusion depression anxiety  Prior Studies Any changes since last visit?  no  Physicians involved in your care Any changes since last visit?  no   History reviewed. No pertinent family history. History   Social History  . Marital Status: Married    Spouse Name: [name]    Number of Children: 1  . Years of Education: Masters   Occupational History  . disability    Social History Main Topics  . Smoking status: Former Smoker -- 2.00 packs/day for 25 years    Types: Cigarettes    Quit date: 10/19/2012  . Smokeless tobacco: Never Used  . Alcohol Use: No  . Drug Use: No  . Sexual Activity: No   Other Topics Concern  . None   Social History Narrative   Pt lives at home with husband Perlie Gold(Russell)   Pt is right handed   Education Masters degree   Caffeine 2 cups daily   Past Surgical History  Procedure Laterality Date  . Cardiac surgery    . Tee without cardioversion N/A 10/18/2012      Procedure: TRANSESOPHAGEAL ECHOCARDIOGRAM (TEE);  Surgeon: Vesta MixerPhilip J Nahser, MD;  [facility];  Service: Cardiovascular;  Laterality: N/A;  . Coronary artery bypass graft    . Radiology with anesthesia N/A 11/09/2012    Procedure: RADIOLOGY WITH ANESTHESIA- CAROTID STENT;  Surgeon: Oneal GroutSanjeev K Deveshwar, MD;  Location: [facility] OR;  Service: Radiology;  Laterality: N/A;   Past Medical History  Diagnosis Date  . Coronary artery disease   . Hypertension   . TIA (transient ischemic attack)   . High cholesterol   . MI (myocardial infarction)   . Stroke   . Arthritis    BP 128/52 mmHg  Pulse 68  Resp 14  Wt 183 lb (83.008 kg)  SpO2 92%  Opioid Risk Score:   Fall Risk Score: High Fall Risk (>13 points) (educated and previously given handout)  Review of Systems  Musculoskeletal: Positive for gait problem.  Neurological: Positive for weakness.       Tingling  Psychiatric/Behavioral: Positive for confusion and dysphoric mood. The patient is nervous/anxious.   All other systems reviewed and are negative.      Objective:   Physical Exam  Constitutional: She is oriented to person, place, and time. She appears well-developed and well-nourished.  Musculoskeletal: Normal range of motion.  Neurological: She is alert and oriented to person, place, and time. She has normal reflexes.  Hyperactive Babinski, Right  No toe flexor synergy    Psychiatric: She has a normal mood and affect.  Nursing note and vitals reviewed.  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 Overall this appears to be beneficial however does have reduction of great toe flexor spasticity which  has allowed EHL spasticity to flourish. Expect this to diminish as phenol effects start to wear off  Consider EHL Botox if hyperactive Babinski persist despite an increase in equinovarus spasticity

## 2014-05-20 NOTE — Patient Instructions (Signed)
Keep sertraline dose at 50mg   Buddy tape Right big toe and long toe

## 2014-05-25 ENCOUNTER — Other Ambulatory Visit: Payer: Self-pay | Admitting: Physical Medicine & Rehabilitation

## 2014-06-29 ENCOUNTER — Other Ambulatory Visit: Payer: Self-pay | Admitting: Physical Medicine & Rehabilitation

## 2014-07-19 ENCOUNTER — Ambulatory Visit: Payer: BC Managed Care – PPO | Admitting: Physical Medicine & Rehabilitation

## 2014-07-22 ENCOUNTER — Encounter: Payer: Self-pay | Admitting: Physical Medicine & Rehabilitation

## 2014-07-22 ENCOUNTER — Ambulatory Visit (HOSPITAL_BASED_OUTPATIENT_CLINIC_OR_DEPARTMENT_OTHER): Payer: BC Managed Care – PPO | Admitting: Physical Medicine & Rehabilitation

## 2014-07-22 ENCOUNTER — Encounter: Payer: BC Managed Care – PPO | Attending: Physical Medicine & Rehabilitation

## 2014-07-22 VITALS — BP 116/68 | HR 88 | Resp 14

## 2014-07-22 DIAGNOSIS — G8110 Spastic hemiplegia affecting unspecified side: Secondary | ICD-10-CM | POA: Insufficient documentation

## 2014-07-22 DIAGNOSIS — R4586 Emotional lability: Secondary | ICD-10-CM | POA: Insufficient documentation

## 2014-07-22 DIAGNOSIS — M7501 Adhesive capsulitis of right shoulder: Secondary | ICD-10-CM

## 2014-07-22 DIAGNOSIS — M62838 Other muscle spasm: Secondary | ICD-10-CM

## 2014-07-22 NOTE — Progress Notes (Signed)
Subjective:    Patient ID: Tiffany [name], female    DOB: 1950/10/30, 64 y.o.   MRN: [id number]  HPI Right tibial neural lysis with phenol 04/08/2014. Last visit was in November 2015 at which time she was having excessive right toe extension. This was felt to be secondary to relief of toe flexor spasticity with release of toe extensor spasticity after tibial nerve block. The plan was to wait for the phenol to wear off to a certain degree which it has and now she no longer has excessive extensor at the right great toe  No upper extremity complaints at the current time. Pain Inventory Average Pain 6 Pain Right Now 6 My pain is intermittent, sharp, burning, stabbing and tingling  In the last 24 hours, has pain interfered with the following? General activity 6 Relation with others 6 Enjoyment of life 6 What TIME of day is your pain at its worst? morning Sleep (in general) Good  Pain is worse with: standing and some activites Pain improves with: rest Relief from Meds: 5  Mobility use a cane how many minutes can you walk? 5 ability to climb steps?  no do you drive?  no  Function retired  Neuro/Psych bladder control problems depression anxiety  Prior Studies Any changes since last visit?  no  Physicians involved in your care Any changes since last visit?  no   History reviewed. No pertinent family history. History   Social History  . Marital Status: Married    Spouse Name: Perlie Gold    Number of Children: 1  . Years of Education: Masters   Occupational History  . disability    Social History Main Topics  . Smoking status: Former Smoker -- 2.00 packs/day for 25 years    Types: Cigarettes    Quit date: 10/19/2012  . Smokeless tobacco: Never Used  . Alcohol Use: No  . Drug Use: No  . Sexual Activity: No   Other Topics Concern  . None   Social History Narrative   Pt lives at home with husband Perlie Gold)   Pt is right handed   Education Masters degree     Caffeine 2 cups daily   Past Surgical History  Procedure Laterality Date  . Cardiac surgery    . Tee without cardioversion N/A 10/18/2012    Procedure: TRANSESOPHAGEAL ECHOCARDIOGRAM (TEE);  Surgeon: Vesta Mixer, MD;  Location[facility] ENDOSCOPY;  Service: Cardiovascular;  Laterality: N/A;  . Coronary artery bypass graft    . Radiology with anesthesia N/A 11/09/2012    Procedure: RADIOLOGY WITH ANESTHESIA- CAROTID STENT;  Surgeon: Oneal Grout, MD;  Location: [facility] OR;  Service: Radiology;  Laterality: N/A;   Past Medical History  Diagnosis Date  . Coronary artery disease   . Hypertension   . TIA (transient ischemic attack)   . High cholesterol   . MI (myocardial infarction)   . Stroke   . Arthritis    BP 116/68 mmHg  Pulse 88  Resp 14  SpO2 94%  Opioid Risk Score:   Fall Risk Score: Low Fall Risk (0-5 points)  Review of Systems  HENT: Negative.   Eyes: Negative.   Respiratory: Negative.   Cardiovascular: Negative.   Gastrointestinal: Negative.   Endocrine: Negative.   Genitourinary: Positive for frequency.       Bladder control problem  Musculoskeletal: Positive for myalgias and arthralgias.       Right arm/hand/fingers Right Shoulder pain Right leg pain  Skin: Negative.   Allergic/Immunologic:  Negative.   Neurological: Positive for weakness.  Hematological: Negative.   Psychiatric/Behavioral: Positive for dysphoric mood. The patient is nervous/anxious.        Objective:   Physical Exam  No evidence of clonus at the right ankle. Standing with her sock off there is mild clawing at the toes. There is minimal Babinski response on the right side  Speech remains aphasic with naming difficulties good receptive skills  Ambulates with a cane. Foot does not show any signs of equino varus positioning.      Assessment & Plan:  1. 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  We will  schedule her in 2 months for probable repeat assuming that her spasticity returns in the foot inverted muscles as well as toe flexors and ankle plantar flexors.  Discussed with patient as well as her daughter agree with the plan

## 2014-07-22 NOTE — Patient Instructions (Addendum)
No phenol or botox needed at this time  May increase gabapentin to 4 times per day, may need to change prescription to 300mg  QID  Please do a timed toileting schedule where you get on the toilet every 2 hours whether you feel you need to or not, limit fluids after 11 PM

## 2014-08-27 ENCOUNTER — Telehealth: Payer: Self-pay | Admitting: *Deleted

## 2014-08-27 MED ORDER — GABAPENTIN 300 MG PO CAPS: 300.0000 mg | ORAL_CAPSULE | Freq: Four times a day (QID) | ORAL | Status: DC

## 2014-08-27 NOTE — Telephone Encounter (Signed)
Called pharmacy to let them know Gabapentin 300 mg #120  #RF2 (SIG) Take 1 capsule QID.

## 2014-09-02 ENCOUNTER — Ambulatory Visit: Payer: BC Managed Care – PPO | Admitting: Registered Nurse

## 2014-09-23 ENCOUNTER — Encounter: Payer: Self-pay | Admitting: Physical Medicine & Rehabilitation

## 2014-09-23 ENCOUNTER — Ambulatory Visit (HOSPITAL_BASED_OUTPATIENT_CLINIC_OR_DEPARTMENT_OTHER): Payer: BC Managed Care – PPO | Admitting: Physical Medicine & Rehabilitation

## 2014-09-23 ENCOUNTER — Encounter: Payer: BC Managed Care – PPO | Attending: Physical Medicine & Rehabilitation

## 2014-09-23 VITALS — BP 126/74 | HR 60 | Resp 14

## 2014-09-23 DIAGNOSIS — G8110 Spastic hemiplegia affecting unspecified side: Secondary | ICD-10-CM

## 2014-09-23 DIAGNOSIS — R4586 Emotional lability: Secondary | ICD-10-CM | POA: Insufficient documentation

## 2014-09-23 DIAGNOSIS — M7501 Adhesive capsulitis of right shoulder: Secondary | ICD-10-CM

## 2014-09-23 MED ORDER — BACLOFEN 10 MG PO TABS: 10.0000 mg | ORAL_TABLET | Freq: Two times a day (BID) | ORAL | Status: DC

## 2014-09-23 NOTE — Patient Instructions (Signed)
Please monitor  Your right ankle tone. If it starts getting tight where it's difficult to pull up her toes or your foot starts twisting, then we will repeat the phenol nerve block of the tibial nerve on the right

## 2014-09-23 NOTE — Progress Notes (Signed)
Patient originally scheduled for right tibial nerve block with phenol. This has been done on a every 6 month basis. The last injection was approximately 5 months ago. At this point she does not feel like her tone is bothering her in her right ankle. She denies any toe curling. She has had no new medical problems. In terms of her medications her primary care physician switched her from Zoloft to Celexa.  Gen. No acute distress Mood and affect are appropriate Motor strength is 3 minus in the right ankle dorsiflexor and plantar flexor. 4 at the knee extensor and hip flexor. 3 minus at the right deltoid 4 minus at the biceps triceps and 3 at the right grip. Left side is normal. Increased tone at the right elbow flexors modified [place] 3  Impression 1. 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 2. Right shoulder contracture frozen shoulder, chronic does have some pain at end range. Not inhibiting ADLs at the current time. Has had joint injections past but none recently.  Return to clinic in one month we'll reassess at that point for possible right tibial nerve block with phenol. Consider musculocutaneous nerve block with phenol versus biceps and brachial radialis Botox in the right upper extremity as well.

## 2014-09-26 ENCOUNTER — Ambulatory Visit: Payer: BC Managed Care – PPO | Admitting: Physical Medicine & Rehabilitation

## 2014-10-24 ENCOUNTER — Encounter: Payer: Self-pay | Admitting: Physical Medicine & Rehabilitation

## 2014-10-24 ENCOUNTER — Ambulatory Visit (HOSPITAL_BASED_OUTPATIENT_CLINIC_OR_DEPARTMENT_OTHER): Payer: Medicare HMO | Admitting: Physical Medicine & Rehabilitation

## 2014-10-24 ENCOUNTER — Encounter: Payer: Medicare HMO | Attending: Physical Medicine & Rehabilitation

## 2014-10-24 VITALS — BP 113/66 | HR 60 | Resp 16

## 2014-10-24 DIAGNOSIS — G8110 Spastic hemiplegia affecting unspecified side: Secondary | ICD-10-CM | POA: Insufficient documentation

## 2014-10-24 DIAGNOSIS — M7501 Adhesive capsulitis of right shoulder: Secondary | ICD-10-CM

## 2014-10-24 DIAGNOSIS — R4586 Emotional lability: Secondary | ICD-10-CM | POA: Diagnosis not present

## 2014-10-24 MED ORDER — GABAPENTIN 300 MG PO CAPS: 300.0000 mg | ORAL_CAPSULE | Freq: Four times a day (QID) | ORAL | Status: DC

## 2014-10-24 NOTE — Patient Instructions (Signed)
Tibial nerve block with phenol today. This medication may start taking the fact today however full effect will be at about one week Duration of the effect is 3-6 months Side effects of medication may include right heel numbness or burning. Call if you have burning pain so we can recommend any medication for that. 

## 2014-10-24 NOTE — Progress Notes (Signed)
Phenol neurolysis of the Right tibial nerve  Indication: Severe spasticity in the plantar flexor muscles which is not responding to medical management and other conservative care and interfering with functional use.  Informed consent was obtained after describing the risks and benefits of the procedure with the patient this includes bleeding bruising and infection as well as medication side effects. The patient elected to proceed and has given written consent. Patient placed in a prone position on the exam table. External DC stimulation was applied to the popliteal space using a nerve stimulator. Plantar flexion twitch was obtained. The popliteal region was prepped with Betadine and then entered with a 22-gauge 40 mm needle electrode under electrical stimulation guidance. Plantar flexion which was obtained and confirmed. Then 4 cc of 5% phenol were injected. The patient tolerated procedure well. Post procedure instructions and followup visit were given. 

## 2014-11-04 ENCOUNTER — Telehealth: Payer: Self-pay

## 2014-11-04 ENCOUNTER — Telehealth: Payer: Self-pay | Admitting: *Deleted

## 2014-11-04 MED ORDER — TRAZODONE HCL 50 MG PO TABS: 50.0000 mg | ORAL_TABLET | Freq: Every day | ORAL | Status: DC

## 2014-11-04 NOTE — Telephone Encounter (Signed)
Tiffany [name] is coming in tomorrow (11/05/14) due to increased pain in her leg and now her arm on the right side after the phenol injection.  She is not able to sleep because of the pain.  Her daughter called to ask if there is anything that can be given to her to help her sleep.

## 2014-11-04 NOTE — Telephone Encounter (Signed)
Trazodone sent to pharmacy and Mrs Ether GriffinsFowler notified.

## 2014-11-04 NOTE — Telephone Encounter (Signed)
May call in trazodone 50mg  QHS prn #30 no RF

## 2014-11-04 NOTE — Telephone Encounter (Signed)
I called the patient to r/s 5/24 appt. She is in a lot of pain since getting a shot (she is not sure exactly what shot she got). She also cannot walk, her leg feels like dead weight. She would like to come in sooner than August, the earliest appt I see with Dr. Pearlean BrownieSethi. Please call and advise.

## 2014-11-05 ENCOUNTER — Ambulatory Visit (HOSPITAL_BASED_OUTPATIENT_CLINIC_OR_DEPARTMENT_OTHER): Payer: Medicare HMO | Admitting: Physical Medicine & Rehabilitation

## 2014-11-05 ENCOUNTER — Encounter: Payer: Self-pay | Admitting: Physical Medicine & Rehabilitation

## 2014-11-05 VITALS — BP 120/54 | HR 64 | Resp 14

## 2014-11-05 DIAGNOSIS — R208 Other disturbances of skin sensation: Secondary | ICD-10-CM

## 2014-11-05 DIAGNOSIS — G8191 Hemiplegia, unspecified affecting right dominant side: Secondary | ICD-10-CM

## 2014-11-05 DIAGNOSIS — G8110 Spastic hemiplegia affecting unspecified side: Secondary | ICD-10-CM | POA: Diagnosis not present

## 2014-11-05 DIAGNOSIS — G8190 Hemiplegia, unspecified affecting unspecified side: Secondary | ICD-10-CM | POA: Diagnosis not present

## 2014-11-05 NOTE — Telephone Encounter (Signed)
Spoke with patient to reschedule her FU per Dr Pearlean BrownieSethi who will be out of office on 11/12/14.  Scheduled her for 02/05/15, 3 pm. Patient understands to arrive at 2:45 pm. Her daughter confirmed date/time as well. Patient is seeing Dr Wynn BankerKirsteins and is going to pain rehab clinic today re: pain since receiving phenol injection on 10/23/14. She verbalized appreciation for cal, understanding of new appointment time/date.

## 2014-11-05 NOTE — Progress Notes (Signed)
Subjective:    Patient ID: Tiffany FormosaPatricia M. Ether [name], female    DOB: May 14, 1951, 64 y.o.   MRN: [id number]  HPI 64 year old female with history of left posterior branch MCA distribution infarct Causing right hemiparesis as well as right hemisensory deficits. She has had problems with spasticity in both the right upper and right lower limb.  Patient's daughter talked to me prior to the visit, Confidentially. She has concerns that the patient is declining because she does not get up and do as much as she can for herself. She is overly reliant on her husband which was a pattern even before her stroke.  Chief complaint is right-sided upper and lower extremity pain  Patient underwent right tibial nerve block about 1 week ago. She's had no bruising or swelling in the lower extremity. She is pain in the right upper limb from the shoulder down to the elbow as well as the  Right lower limb in the thigh as well as the calf area No falls or other trauma.  No swallowing problems, no new speech problems Pain Inventory Average Pain 6 Pain Right Now 6 My pain is constant, burning, dull, aching and feels heavy  In the last 24 hours, has pain interfered with the following? General activity 5 Relation with others 5 Enjoyment of life 5 What TIME of day is your pain at its worst? evening and night Sleep (in general) Fair  Pain is worse with: walking, bending, sitting, standing and some activites Pain improves with: rest and medication Relief from Meds: 1  Mobility walk with assistance how many minutes can you walk? 2-3 ability to climb steps?  no do you drive?  no  Function disabled: date disabled .  Neuro/Psych weakness tingling trouble walking  Prior Studies Any changes since last visit?  no  Physicians involved in your care Any changes since last visit?  no   PHQ9   - 13   History reviewed. No pertinent family history. History   Social History  . Marital Status: Married    Spouse  Name: [name]  . Number of Children: 1  . Years of Education: Masters   Occupational History  . disability    Social History Main Topics  . Smoking status: Former Smoker -- 2.00 packs/day for 25 years    Types: Cigarettes    Quit date: 10/19/2012  . Smokeless tobacco: Never Used  . Alcohol Use: No  . Drug Use: No  . Sexual Activity: No   Other Topics Concern  . None   Social History Narrative   Pt lives at home with husband Perlie Gold(Russell)   Pt is right handed   Education Masters degree   Caffeine 2 cups daily   Past Surgical History  Procedure Laterality Date  . Cardiac surgery    . Tee without cardioversion N/A 10/18/2012    Procedure: TRANSESOPHAGEAL ECHOCARDIOGRAM (TEE);  Surgeon: Vesta MixerPhilip J Nahser, MD;  [facility];  Service: Cardiovascular;  Laterality: N/A;  . Coronary artery bypass graft    . Radiology with anesthesia N/A 11/09/2012    Procedure: RADIOLOGY WITH ANESTHESIA- CAROTID STENT;  Surgeon: Oneal GroutSanjeev K Deveshwar, MD;  Location: [facility] OR;  Service: Radiology;  Laterality: N/A;   Past Medical History  Diagnosis Date  . Coronary artery disease   . Hypertension   . TIA (transient ischemic attack)   . High cholesterol   . MI (myocardial infarction)   . Stroke   . Arthritis    BP 120/54 mmHg  Pulse  64  Resp 14  SpO2 91%  Opioid Risk Score:   Fall Risk Score: Low Fall Risk (0-5 points)`1  Depression screen PHQ 2/9  No flowsheet data found.   Review of Systems  HENT: Negative.   Eyes: Negative.   Respiratory: Negative.   Cardiovascular: Negative.   Gastrointestinal: Negative.   Endocrine: Negative.   Genitourinary: Negative.   Musculoskeletal: Positive for myalgias and arthralgias.  Skin: Negative.   Allergic/Immunologic: Negative.   Neurological: Positive for weakness.       Trouble walking, heaviness, tingling, burning  Hematological: Negative.   Psychiatric/Behavioral: Negative.        Objective:   Physical Exam  Constitutional: She  is oriented to person, place, and time. She appears well-developed and well-nourished.  HENT:  Head: Normocephalic and atraumatic.  Eyes: Conjunctivae are normal. Pupils are equal, round, and reactive to light.  Neurological: She is alert and oriented to person, place, and time. Coordination and gait abnormal.  Motor strength is 3 minus in the right deltoid, biceps, triceps, grip 4 minus in the right hip flexor and knee extensor 2 minus at the ankle dorsiflexor and plantar flexor  Tone right lower extremity has 2 beat clonus at the right ankle. Decreased fine motor right upper limb Ambulates with handheld assist or a cane No evidence of toe drag during gait.  Motor strength is 5/5 in left upper limb as well as left lower limb  Psychiatric: Her affect is labile. Her speech is delayed.  Tendency to cry easily  Nursing note and vitals reviewed.         Assessment & Plan:  1. 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 She's had good results with the phenol tibial nerve block in terms of spasticity reduction, no signs of any bleeding bruising or infection. I do think her tone has normalized and now she needs gait retraining to help incorporate heel strike Her neurologic examination is without change as compared to prior My impression is the patient has become progressively deconditioned over time and less active and independent as well. I discussed was with the patient as well as her daughter. Recommend home health PT and OT reevaluation. We'll work on transfers as well as ambulation as well as self-care.  Once her mobility improves she can either attend outpatient therapy and or aquatic exercise program  2. Right-sided dysesthetic pain stroke related do not think there is any complication from the right tibial nerve block. Patient gets good relief with gabapentin however reluctant to take to take this trend the day. I asked her to start  taking gabapentin 3 times per day, she may habituate to some of the sedating effects.  Return to clinic 4-6 weeks

## 2014-11-05 NOTE — Telephone Encounter (Signed)
Patient called back. Could you please return call @[phone].  Thanks!

## 2014-11-05 NOTE — Patient Instructions (Signed)
Talk to your primary care doctor about  weight gain. You may need to have a thyroid blood test.May need to consider medications other than Celexa May need to consider psychology evaluation

## 2014-11-05 NOTE — Telephone Encounter (Signed)
Called to reschedule FU, but husband states "she's still in the bed." He requested call back this afternoon. Patient has appointments today at the pain rehab clinic and with Dr Wynn BankerKirsteins.

## 2014-11-12 ENCOUNTER — Ambulatory Visit: Payer: BC Managed Care – PPO | Admitting: Neurology

## 2014-12-05 ENCOUNTER — Ambulatory Visit: Payer: BC Managed Care – PPO | Admitting: Physical Medicine & Rehabilitation

## 2014-12-05 ENCOUNTER — Ambulatory Visit: Payer: BC Managed Care – PPO

## 2014-12-12 ENCOUNTER — Telehealth: Payer: Self-pay | Admitting: Neurology

## 2014-12-12 NOTE — Telephone Encounter (Signed)
Caryn Bee with [facility] is calling to get prior authorization for Rx Nuedesta 20-10mg  for the patient. Please call and advise. Thank you.

## 2014-12-18 ENCOUNTER — Telehealth: Payer: Self-pay | Admitting: *Deleted

## 2014-12-18 NOTE — Telephone Encounter (Signed)
Family calling to check on status of home health PT OT.  They have not heard anything.  Tiffany [name] is checking on this.

## 2014-12-18 NOTE — Telephone Encounter (Signed)
pts daughter called in and she asked for a sooner follow up appt for her mother. i was able to change the appt to the beginning of august. She thanked me

## 2014-12-26 ENCOUNTER — Other Ambulatory Visit: Payer: Self-pay | Admitting: *Deleted

## 2014-12-26 MED ORDER — BACLOFEN 10 MG PO TABS: 10.0000 mg | ORAL_TABLET | Freq: Two times a day (BID) | ORAL | Status: DC

## 2014-12-30 ENCOUNTER — Telehealth: Payer: Self-pay | Admitting: *Deleted

## 2014-12-30 NOTE — Telephone Encounter (Signed)
Spoke with Elease Hashimotoatricia and gave her the message.

## 2014-12-30 NOTE — Telephone Encounter (Signed)
Mr Ether GriffinsFowler called about Elease Hashimotoatricia having a blister over the injection site from her last injection.  It was red after the injection but recently (~5 days ago) developed a blister that has since "busted".  He is applying neosporin to the area.  She  Has an appointment 01/07/15.  She was unable to keep her June app because of illness. Please advise.

## 2014-12-30 NOTE — Telephone Encounter (Signed)
Sounds appropriate in terms of management.  If redness increased see PCP

## 2015-01-07 ENCOUNTER — Encounter: Payer: Self-pay | Admitting: Physical Medicine & Rehabilitation

## 2015-01-07 ENCOUNTER — Encounter: Payer: Medicare Other | Attending: Physical Medicine & Rehabilitation

## 2015-01-07 ENCOUNTER — Ambulatory Visit (HOSPITAL_BASED_OUTPATIENT_CLINIC_OR_DEPARTMENT_OTHER): Payer: Medicare Other | Admitting: Physical Medicine & Rehabilitation

## 2015-01-07 VITALS — BP 143/82 | HR 62 | Resp 14

## 2015-01-07 DIAGNOSIS — R29810 Facial weakness: Secondary | ICD-10-CM | POA: Diagnosis not present

## 2015-01-07 DIAGNOSIS — R269 Unspecified abnormalities of gait and mobility: Secondary | ICD-10-CM

## 2015-01-07 DIAGNOSIS — R4586 Emotional lability: Secondary | ICD-10-CM | POA: Insufficient documentation

## 2015-01-07 DIAGNOSIS — G8110 Spastic hemiplegia affecting unspecified side: Secondary | ICD-10-CM | POA: Diagnosis not present

## 2015-01-07 NOTE — Patient Instructions (Signed)
Based on my examination today you have a increased right facial droop, As well as increased weakness in the right arm and right leg. In addition your having increasing problems with mobility. You were able to walk when I last saw you in May with an assisted device and that he needed 2 people to help you to get from the wheelchair to the bed. I'm suspicious he may have a new stroke and I think you need to be evaluated in emergency department. Since this occurred somewhere between 1 and 2 weeks ago according to the reports I do not think you need transportation via an ambulance.

## 2015-01-07 NOTE — Progress Notes (Signed)
63 year old female with history of right hemiparesis from left CVA history of CAD, HTN, TIA ; who developed difficulty speaking with right sided weakness on am of 10/16/12. She was treated with TPA. MRI brain done revealing L-MCA territory infarct affecting insula, operculum, left motor strip, superior parietal lobe, and left splenium. MRA brain with LMCA occlusion in M1 segment with no distal flow signal. She did have worsening of symptoms past admission and follow up CCT without evidence of bleed. TEE was negative for PFO, ASD or thrombus  Patient was originally scheduled for muscular cutaneous nerve block today.The patient has had a decline in her mobility status over the course of the last 1-2 weeks. She is now requiring physical assistance for transfers. I last evaluated patient in May, at that point she was experiencing a slow decline in her functional status and it looked like she was mainly deconditioned. Home health was ordered. A tibial nerve block was performed for spasticity in the right lower extremity.  According the husband patient has had a physical therapist evaluate her and felt like she may have had another stroke.  Patient does not complain of any new swallowing problems. She has had no new speech problems. The patient states she is worried her husband will hurt his back because of her decreased mobility  Constitutional: She is oriented to person, place, and time. She appears well-developed and well-nourished.  HENT: Right facial droop appears to be increased. She has mild drooling but also does have some nasal discharge and is tearing up. Head: Normocephalic and atraumatic.  Heart regular rate and rhythm no rubs murmurs or extra sounds Abdomen positive bowel sounds soft nontender palpation Extremities no clubbing cyanosis or edema Lungs are clear to auscultation Eyes: Conjunctivae are normal. Pupils are equal, round, and reactive to light.  Neurological: She is alert and oriented  to person, place, and time. Coordination and gait abnormal. She requires 2 person assist from sit to stand. Motor strength is 2 minus in the right deltoid, biceps, triceps, grip. Decreased range of motion in the right shoulder 3 minus in the right hip flexor and knee extensor 2 minus at the ankle dorsiflexor and plantar flexor Increased tone at the right biceps.   Decreased fine motor right upper limb Ambulates with 2 person assist just a few feet from wheelchair to exam table No evidence of toe drag during gait.  Motor strength is 5/5 in left upper limb as well as left lower limb   Impression 1. History of left MCA distribution CVA with right hemiparesis. She's had a gradual decline in function over couple months however over the last 1-2 weeks she is now developed increasing right facial droop as well as increased weakness and decreased ability to transfer herself. She just required supervision assistance before now she is requiring physical assistance of 1-2 people at it least a moderate assist level. I suspect she may have had a new infarct and since this is more than a week ago she is certainly outside the window of any kind of interventional procedure or TPA. However she may have a different etiology of recurrent stroke and recommend ER evaluation to further assess.I have recommended she go to [facility] however patient and husband would like to go to [facility] which is closer to home and where her primary physician has admitting privileges  We will not do the musculocutaneous nerve block today. I will reevaluate the patient 1 month assuming she is not in the Inpatient Rehabilitation unit  She has an appointment with neurology in about 2 weeks but I don't think she can wait for that.

## 2015-01-08 ENCOUNTER — Telehealth: Payer: Self-pay | Admitting: Physical Medicine & Rehabilitation

## 2015-01-08 NOTE — Telephone Encounter (Signed)
Wanted to let you know that the patient went to [facility] and has had another stroke on both sides of the brain.  She is not [facility].

## 2015-01-20 ENCOUNTER — Ambulatory Visit: Payer: Self-pay | Admitting: Neurology

## 2015-02-05 ENCOUNTER — Ambulatory Visit: Payer: Self-pay | Admitting: Neurology

## 2015-02-06 ENCOUNTER — Encounter: Payer: Medicare Other | Attending: Physical Medicine & Rehabilitation

## 2015-02-06 ENCOUNTER — Ambulatory Visit: Payer: Medicare Other | Admitting: Physical Medicine & Rehabilitation

## 2015-02-06 DIAGNOSIS — G8110 Spastic hemiplegia affecting unspecified side: Secondary | ICD-10-CM | POA: Insufficient documentation

## 2015-02-06 DIAGNOSIS — R4586 Emotional lability: Secondary | ICD-10-CM | POA: Insufficient documentation

## 2015-04-25 ENCOUNTER — Encounter: Payer: Self-pay | Admitting: Physical Medicine & Rehabilitation

## 2015-04-25 ENCOUNTER — Encounter: Payer: Medicare Other | Attending: Physical Medicine & Rehabilitation

## 2015-04-25 ENCOUNTER — Ambulatory Visit (HOSPITAL_BASED_OUTPATIENT_CLINIC_OR_DEPARTMENT_OTHER): Payer: Medicare Other | Admitting: Physical Medicine & Rehabilitation

## 2015-04-25 VITALS — BP 122/78 | HR 82 | Resp 18

## 2015-04-25 DIAGNOSIS — R4586 Emotional lability: Secondary | ICD-10-CM | POA: Insufficient documentation

## 2015-04-25 DIAGNOSIS — M79671 Pain in right foot: Secondary | ICD-10-CM | POA: Diagnosis not present

## 2015-04-25 DIAGNOSIS — G8110 Spastic hemiplegia affecting unspecified side: Secondary | ICD-10-CM

## 2015-04-25 NOTE — Progress Notes (Signed)
Subjective:    Patient ID: Tiffany FormosaPatricia M. Ether [name], female    DOB: 04-Sep-1950, 64 y.o.   MRN: [id number]  HPI   Pain Inventory Average Pain 4 Pain Right Now 3 My pain is dull  In the last 24 hours, has pain interfered with the following? General activity 7 Relation with others 3 Enjoyment of life 5 What TIME of day is your pain at its worst? night Sleep (in general) Good  Pain is worse with: walking Pain improves with: injections Relief from Meds: 5  Mobility walk with assistance use a walker ability to climb steps?  no do you drive?  no use a wheelchair needs help with transfers Do you have any goals in this area?  yes  Function not employed: date last employed . I need assistance with the following:  dressing, bathing, toileting, meal prep, household duties and shopping Do you have any goals in this area?  yes  Neuro/Psych bladder control problems weakness tremor trouble walking confusion depression anxiety  Prior Studies Any changes since last visit?  no  Physicians involved in your care Any changes since last visit?  no   History reviewed. No pertinent family history. Social History   Social History  . Marital Status: Married    Spouse Name: Tiffany [name]  . Number of Children: 1  . Years of Education: Masters   Occupational History  . disability    Social History Main Topics  . Smoking status: Former Smoker -- 2.00 packs/day for 25 years    Types: Cigarettes    Quit date: 10/19/2012  . Smokeless tobacco: Never Used  . Alcohol Use: No  . Drug Use: No  . Sexual Activity: No   Other Topics Concern  . None   Social History Narrative   Pt lives at home with husband Tiffany Gold(Russell)   Pt is right handed   Education Masters degree   Caffeine 2 cups daily   Past Surgical History  Procedure Laterality Date  . Cardiac surgery    . Tee without cardioversion N/A 10/18/2012    Procedure: TRANSESOPHAGEAL ECHOCARDIOGRAM (TEE);  Surgeon: Vesta MixerPhilip J Nahser, MD;   [facility];  Service: Cardiovascular;  Laterality: N/A;  . Coronary artery bypass graft    . Radiology with anesthesia N/A 11/09/2012    Procedure: RADIOLOGY WITH ANESTHESIA- CAROTID STENT;  Surgeon: Oneal GroutSanjeev K Deveshwar, MD;  Location: [facility] OR;  Service: Radiology;  Laterality: N/A;   Past Medical History  Diagnosis Date  . Coronary artery disease   . Hypertension   . TIA (transient ischemic attack)   . High cholesterol   . MI (myocardial infarction) (HCC)   . Stroke (HCC)   . Arthritis    BP 122/78 mmHg  Pulse 82  Resp 18  SpO2 94%  Opioid Risk Score:   Fall Risk Score:  `1  Depression screen PHQ 2/9  [facility] 01/07/2015 11/05/2014  Decreased Interest 3 1  Down, Depressed, Hopeless 1 1  PHQ - 2 Score 4 2  Altered sleeping 0 3  Tired, decreased energy 3 3  Change in appetite 2 2  Feeling bad or failure about yourself  3 0  Trouble concentrating 3 1  Moving slowly or fidgety/restless 0 2  Suicidal thoughts 0 0  PHQ-9 Score 15 13  Difficult doing work/chores Very difficult -     Review of Systems  Musculoskeletal: Positive for gait problem.  Neurological: Positive for tremors and weakness.  Psychiatric/Behavioral: Positive for confusion and dysphoric  mood. The patient is nervous/anxious.        Objective:   Physical Exam        Assessment & Plan:

## 2015-04-25 NOTE — Progress Notes (Signed)
64 year old female with history of chronic right spastic hemiplegia due to prior CVAIn the left MCA distribution In 2014. She underwent Inpatient rehabilitation. She also went right ICA stenting. Was noted to have left ICA occlusion.  Last visit with me was in May 2016 at which point she was complaining of increasing weakness on the right side. I felt she had a new CVA and [facility] went [facility]. She was found to have bilateral infarcts, recent one left frontal temporal extended into the left corona radiata as well as a smaller right mid frontal lobe infarct.  She underwent arteriogram showing total occlusion of the left ICA and a mild atherosclerotic plaque in the right common carotid and carotid bifurcation.  Darl PikesSusan July 2016.  Over the last month or so she's had right foot pain. She does have a history of post stroke pain and was treated with Neurontin 300 mg 3 times a day. She states she's gotten some relief of her cramping type pain with tibial nerve blocks on the right side and the last one was performed in May. She had some foot abrasions about a month ago after a fall but these have healed. She is a former smoker  Examination right lower extremity has spasticity causing toe flexion as well as ankle plantar flexion. She does have clonus at the ankles. She has a bluish discoloration but the foot is warm. She has decreased pedal and posterior tibial pulses on the right side.  Impression right spastic hemiplegia secondary to left MCA infarct with a more recent left subcortical infarct. I do not think her hemiplegia has worsened since I last saw her. I do think she has plantar flexor and toe flexor spasticity that would benefit from tibial nerve block.  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  Informed consent was obtained after describing the risks and benefits of the procedure with the patient this includes bleeding bruising and infection as well as medication side effects. The patient elected to proceed and has given written consent. Patient placed in a prone position on the exam table. External DC stimulation was applied to the popliteal space using a nerve stimulator. Plantar flexion twitch was obtained. The popliteal region was prepped with Betadine and then entered with a 22-gauge 40 mm needle electrode under electrical stimulation guidance. Plantar flexion which was obtained and confirmed. Then 4 cc of 5% phenol were injected. The patient tolerated procedure well. Post procedure instructions and followup visit were given.  2. Right foot pain she does not have any pain with range of motion of the right foot or ankle there is no evidence of joint swelling. She does not have dysesthetic pain i.e. Allodynia or hyperpathia. I did recommend that she gets a blood flow study, arterial drop blurs and will refer her to vascular surgery.

## 2015-06-06 ENCOUNTER — Encounter: Payer: Medicare Other | Attending: Physical Medicine & Rehabilitation

## 2015-06-06 ENCOUNTER — Ambulatory Visit (HOSPITAL_BASED_OUTPATIENT_CLINIC_OR_DEPARTMENT_OTHER): Payer: Medicare Other | Admitting: Physical Medicine & Rehabilitation

## 2015-06-06 ENCOUNTER — Encounter: Payer: Self-pay | Admitting: Physical Medicine & Rehabilitation

## 2015-06-06 VITALS — BP 119/64 | HR 60 | Resp 14

## 2015-06-06 DIAGNOSIS — G8111 Spastic hemiplegia affecting right dominant side: Secondary | ICD-10-CM

## 2015-06-06 DIAGNOSIS — R4586 Emotional lability: Secondary | ICD-10-CM | POA: Diagnosis not present

## 2015-06-06 DIAGNOSIS — G8110 Spastic hemiplegia affecting unspecified side: Secondary | ICD-10-CM | POA: Insufficient documentation

## 2015-06-06 NOTE — Progress Notes (Signed)
Subjective:    Patient ID: Tiffany [name]. Tiffany [name], female    DOB: 07/10/1950, 64 y.o.   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 She underwent Inpatient rehabilitation. She also went right ICA stenting. Was noted to have left ICA occlusion.  Last visit with me was in May 2016 at which point she was complaining of increasing weakness on the right side. I felt she had a new CVA and she went to [facility] where she was admitted. She was found to have bilateral infarcts, recent one left frontal temporal extended into the left corona radiata as well as a smaller right mid frontal lobe infarct.  She underwent arteriogram showing total occlusion of the left ICA and a mild atherosclerotic plaque in the right common carotid and carotid bifurcation.  Darl Pikes July 2016.  Over the last month or so she's had right foot pain. She does have a history of post stroke pain and was treated with Neurontin 300 mg 3 times a day. She states she's gotten some relief of her cramping type pain with tibial nerve blocks on the right side and the last one was performed in May. HPI Right tibial nerve block with phenol on 11/4 Ankle rolls over less, big toe curls but other toes do not Doing well with PT, Home health just finished UTI since last Stands up from car and pivots to [facility]  Saw urologist yesterdaqy, was in office 4hours yesterday, "dye test", IV pyelogram and cystogram Pain Inventory Average Pain 3 Pain Right Now 0 My pain is intermittent, sharp, stabbing and aching  In the last 24 hours, has pain interfered with the following? General activity 4 Relation with others 4 Enjoyment of life 6 What TIME of day is your pain at its worst? evening Sleep (in general) Good  Pain is worse with: walking, standing and some activites Pain improves with: medication Relief from Meds: Good  Mobility walk with assistance use  a cane use a walker ability to climb steps?  no do you drive?  no use a wheelchair needs help with transfers Do you have any goals in this area?  yes  Function retired  Neuro/Psych weakness confusion depression  Prior Studies x-rays  Physicians involved in your care Any changes since last visit?  no   History reviewed. No pertinent family history. Social History   Social History  . Marital Status: Married    Spouse Name: Perlie Gold  . Number of Children: 1  . Years of Education: Masters   Occupational History  . disability    Social History Main Topics  . Smoking status: Former Smoker -- 2.00 packs/day for 25 years    Types: Cigarettes    Quit date: 10/19/2012  . Smokeless tobacco: Never Used  . Alcohol Use: No  . Drug Use: No  . Sexual Activity: No   Other Topics Concern  . None   Social History Narrative   Pt lives at home with husband Perlie Gold)   Pt is right handed   Education Masters degree   Caffeine 2 cups daily   Past Surgical History  Procedure Laterality Date  . Cardiac surgery    . Tee without cardioversion N/A 10/18/2012    Procedure: TRANSESOPHAGEAL ECHOCARDIOGRAM (TEE);  Surgeon: Vesta Mixer, MD;  Location: [facility] ENDOSCOPY;  Service: Cardiovascular;  Laterality: N/A;  . Coronary artery bypass graft    . Radiology with anesthesia N/A 11/09/2012  Procedure: RADIOLOGY WITH ANESTHESIA- CAROTID STENT;  Surgeon: Oneal GroutSanjeev K Deveshwar, MD;  Location: [facility] OR;  Service: Radiology;  Laterality: N/A;   Past Medical History  Diagnosis Date  . Coronary artery disease   . Hypertension   . TIA (transient ischemic attack)   . High cholesterol   . MI (myocardial infarction) (HCC)   . Stroke (HCC)   . Arthritis    BP 119/64 mmHg  Pulse 60  Resp 14  SpO2 91%  Opioid Risk Score:   Fall Risk Score:  `1  Depression screen PHQ 2/9  Depression screen [facility] 2/9 01/07/2015 11/05/2014  Decreased Interest 3 1  Down, Depressed, Hopeless 1 1  PHQ - 2 Score 4  2  Altered sleeping 0 3  Tired, decreased energy 3 3  Change in appetite 2 2  Feeling bad or failure about yourself  3 0  Trouble concentrating 3 1  Moving slowly or fidgety/restless 0 2  Suicidal thoughts 0 0  PHQ-9 Score 15 13  Difficult doing work/chores Very difficult -     Review of Systems  Respiratory: Positive for cough.   Gastrointestinal: Positive for diarrhea.  Neurological: Positive for tremors and weakness.  Psychiatric/Behavioral: Positive for confusion and dysphoric mood.  All other systems reviewed and are negative.      Objective:   Physical Exam  Constitutional: She is oriented to person, place, and time. She appears well-developed and well-nourished.  HENT:  Head: Normocephalic and atraumatic.  Eyes: Conjunctivae and EOM are normal. Pupils are equal, round, and reactive to light.  Neck: Normal range of motion.  Neurological: She is alert and oriented to person, place, and time.  Speech dysarthric Foot with mild inversion. With standing she has mild toe curling at digits 2 through 5 but not at the great toe. She has 3 minus inversion eversion ankle dorsal flexion plantarflexion at the ankle. Knee extension is 4 Right upper extremity 4/5 at the finger flexors and finger extensors are 3 deltoid 3 minus biceps triceps 3  Psychiatric: She has a normal mood and affect.  Nursing note and vitals reviewed.         Assessment & Plan:  1. Right spastic hemiplegia due to left CVA. Her spasticity has improved in right lower extremity after phenol injection/neural lysis of the tibial nerve. Usual duration responses through 6 months. Patient will follow up in 3 months for potential reinjection if this one has worn off. I believe patient may benefit from additional PT and OT and have written an order for outpatient therapy now that home health is finished.

## 2015-06-12 ENCOUNTER — Telehealth: Payer: Self-pay | Admitting: *Deleted

## 2015-06-12 NOTE — Telephone Encounter (Signed)
Tiffany BraunKaren from [facility] called to say that Dr Wynn BankerKirsteins ordered OT on Tiffany [name] and they only do PT there.  They will be glad to see her if he chooses but the order will need to be changed to PT.

## 2015-06-14 NOTE — Telephone Encounter (Signed)
May change to PT

## 2015-06-17 NOTE — Telephone Encounter (Signed)
Order was placed for the PT the same day as the OT

## 2015-06-27 ENCOUNTER — Other Ambulatory Visit: Payer: Self-pay | Admitting: *Deleted

## 2015-06-27 MED ORDER — BACLOFEN 10 MG PO TABS: 10.0000 mg | ORAL_TABLET | Freq: Two times a day (BID) | ORAL | Status: AC

## 2015-09-04 ENCOUNTER — Ambulatory Visit: Payer: Medicare Other | Admitting: Physical Medicine & Rehabilitation

## 2015-09-19 ENCOUNTER — Ambulatory Visit: Payer: BC Managed Care – PPO | Admitting: Neurology

## 2015-09-22 ENCOUNTER — Encounter: Payer: Self-pay | Admitting: Neurology

## 2015-10-15 ENCOUNTER — Other Ambulatory Visit: Payer: Self-pay | Admitting: *Deleted

## 2015-10-15 ENCOUNTER — Telehealth: Payer: Self-pay | Admitting: *Deleted

## 2015-10-15 NOTE — Telephone Encounter (Signed)
err

## 2015-10-16 MED ORDER — GABAPENTIN 300 MG PO CAPS: 300.0000 mg | ORAL_CAPSULE | Freq: Four times a day (QID) | ORAL | Status: AC

## 2015-10-24 ENCOUNTER — Other Ambulatory Visit: Payer: Self-pay | Admitting: Orthopaedic Surgery

## 2015-10-24 DIAGNOSIS — M47816 Spondylosis without myelopathy or radiculopathy, lumbar region: Secondary | ICD-10-CM

## 2015-11-03 ENCOUNTER — Ambulatory Visit
Admission: RE | Admit: 2015-11-03 | Discharge: 2015-11-03 | Disposition: A | Discharge status: Home / Self Care | Payer: Medicare Other | Source: Ambulatory Visit | Attending: Orthopaedic Surgery | Admitting: Orthopaedic Surgery

## 2015-11-03 DIAGNOSIS — M47816 Spondylosis without myelopathy or radiculopathy, lumbar region: Secondary | ICD-10-CM

## 2015-11-12 ENCOUNTER — Ambulatory Visit (INDEPENDENT_AMBULATORY_CARE_PROVIDER_SITE_OTHER): Payer: Medicare Other | Admitting: Neurology

## 2015-11-12 ENCOUNTER — Encounter: Payer: Self-pay | Admitting: Neurology

## 2015-11-12 VITALS — BP 110/70 | HR 65 | Ht 66.0 in | Wt 168.4 lb

## 2015-11-12 DIAGNOSIS — I6522 Occlusion and stenosis of left carotid artery: Secondary | ICD-10-CM

## 2015-11-12 DIAGNOSIS — I6990 Unspecified sequelae of unspecified cerebrovascular disease: Secondary | ICD-10-CM

## 2015-11-12 NOTE — Progress Notes (Signed)
PATIENT: Tiffany [name] DOB: [birth year]  REASON FOR VISIT: routine follow up for stroke HISTORY FROM: patient and daughter  HISTORY OF PRESENT ILLNESS: 65 year Caucasian lady seen for first office f/u visit [facility]12 With acute onset aphasia and right hemiparesis and right facial droop.NIHSS was 13 on admission and she was treated with iv TPA . CT head subsequently showed subtle hypodensity in left periinsular region without hemorrhage . MRI showed left insular and opercular cortex infarct with mild cytotoxic edema.MRA brain showed occluded left m 1 MCA.Transthoraxic echo showed normal ejection fraction.TEE showed no clot or PFO.Carotid dopplers showed left ICA occlusion and greater than 80% RICA stenosis.She has h/o transient right sided weakness in March 2014 and saw Dr Lendell CapriceSullivan a neurologist in [place] who did outpatient MRi showing small left brain infarct. MRA neck was ordered as outpatient and results were pending at the time of her stroke on 10/16/12. Vascular risk factors identified included HTN, Hyperlipidimia,CAD,Obesity, carotid occlusion and TIA.Patient was seen by therapies and transferred to inpatient rehab and has done well with good recovery of strength and speech except when she is tired when she gets words hesitancy and dragging of right leg transiently. 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 She has been compliant with her medicines and and has finished her therapy sessions. Her blood pressure is good and is 124/68 today.She walks with a cane with good balance and no recent falls. She underwent elective right ICA stenting by Dr Corliss Skainseveshwar on 11/09/12 and is tolerating aspirin and plavix but has increased bruising but no bleeding.  UPDATE 06/25/13 (LL): Patient returns to office for stroke follow up. Carotid duplex completed on 03/19/13 showing Right: Stent patent. No evidence of  restenosis. Left: Occluded internal carotid artery. She has improved on the use of her right hand and right leg; is continuing to do exercises learned in therapies. She is walking short distnaces without cane. Plans to start water aerobics. She lacks fine motor control in right hand but it is getting better. Her speech is improved as well, still with expressive deficits. She has considerable depression post stroke, mourning the loss of freedom and traveling full-time job. She used to take Effexor XR 150 mg but was reduced to 75 mg daily after first TIA's occurred in April and was never switched back to 150 mg daily dose after hospitalization. She has not returned to driving, adding to her depression. She has had 2 injections to frozen shoulder and is due for a third with Dr. Larna DaughtersKirstens. Bp is well controlled; is 133/86 in office today. She is tolerating Plavix and aspirin with mild bruising, no bleeding.   UPDATE 01/08/14 (LL):   Since last visit, she has had continued emotional lability, even with increased dose of Venlafaxine.  She cries very easily, sometimes for no apparent reason.  Denies inappropriate laughter. Walking without cane now, dragging right foot less.  Denies any falls. Has not been able to return to driving due to weakness of right leg, also sensory loss.  She is tolerating Plavix and aspirin with mild bruising, no bleeding. Blood pressure is well controlled, is 128/74 in the office today.  Continues to follow up with Dr. Wynn BankerKirsteins, shoulder injections have helped.  UPDATE 05/09/14 (LL): Since last visit, she has had more difficulty with walking, and has fallen twice recently.  She is now using a rollator walker for assistance. She says  her depression is worse, her daughter agrees, and she increased her sertraline to 25 mg in the morning and night. She has some days of diarrhea, but it is not consistently every day. Her daughter states she doesn't eat like she should and that her elimination  pattern has always been variable, questionably IBS. She has been following up with Dr. Wynn BankerKirsteins for her right foot and right shoulder. She is tolerating Plavix well with no signs of significant bleeding or bruising. Blood pressure is well controlled, it is 124/71 in the office today.   Update 11/12/2015 : She returns for follow-up after last visit in November 2015. She is accompanied by her daughter. The patient was [facility] in [facility] with another stroke. I have reviewed imaging studies personallywhich showed bilateral frontotemporal small infarcts from small vessel disease.She was on Plavix and was switched to Aggrenox She does have residual right-sided weakness and gait and balance difficulties. She walks with a walker mostly only short distances and requires one-person assist. She's had 2 falls in the last 1 year particular eventual scarring and time to walk quickly. She is tolerating Aggrenox well. She states her blood pressure is pretty good today it is 110/70 in fact Lopressor had to be discontinued. She is tolerating Lipitor well and 8 months ago the dose was increased and subsequent follow-up lipid profiles have been fine. She is still living at home with her husband. She has not had any follow-up carotid Dopplers done.   ALLERGIES: Allergies  Allergen Reactions  . Macrobid [Nitrofurantoin Monohyd Macro] Swelling  . Benadryl [Diphenhydramine Hcl (Sleep)] Other (See Comments)    Bugs crawling sensation  . Codeine Nausea Only    HOME MEDICATIONS: Outpatient Prescriptions Prior to Visit  Medication Sig Dispense Refill  . baclofen (LIORESAL) 10 MG tablet Take 1 tablet (10 mg total) by mouth 2 (two) times daily. 60 each 2  . citalopram (CELEXA) 40 MG tablet Take 40 mg by mouth daily.    . clonazePAM (KLONOPIN) 0.5 MG tablet Take 0.5 mg by mouth 2 (two) times daily as needed for anxiety.     . dipyridamole-aspirin (AGGRENOX) 200-25 MG 12hr capsule Take 1 capsule by mouth  2 (two) times daily.    Marland Kitchen. gabapentin (NEURONTIN) 300 MG capsule Take 1 capsule (300 mg total) by mouth 4 (four) times daily. 120 capsule 5  . mometasone (NASONEX) 50 MCG/ACT nasal spray Place 2 sprays into the nose daily. 17 g 5  . atorvastatin (LIPITOR) 40 MG tablet Take 80 mg by mouth daily. Reported on 11/12/2015    . cetirizine (ZYRTEC) 10 MG tablet Take 10 mg by mouth daily. Reported on 11/12/2015    . metoprolol (LOPRESSOR) 50 MG tablet Take 0.5 tablets (25 mg total) by mouth 2 (two) times daily. (Patient not taking: Reported on 11/12/2015)    . traZODone (DESYREL) 50 MG tablet Take 1 tablet (50 mg total) by mouth at bedtime. (Patient not taking: Reported on 11/12/2015) 30 tablet 0   Facility-Administered Medications Prior to Visit  Medication Dose Route Frequency Provider Last Rate Last Dose  . fentaNYL (SUBLIMAZE) injection 25-50 mcg  25-50 mcg Intravenous Q5 min PRN Bedelia PersonLee Kasik, MD        PHYSICAL EXAM Filed Vitals:   11/12/15 1011  BP: 110/70  Pulse: 65  Height: 5\' 6"  (1.676 m)  Weight: 168 lb 6.4 oz (76.386 kg)   Body mass index is 27.19 kg/(m^2).  ROS:  14 system review of systems is positive  for   urination problems, aching muscles, slurred speech, depression, anxiety,   allergies, running nose, memory loss, numbness, weakness,gait difficulty slurred speech, tremor, sleepiness Physical Exam   General: well developed, well nourished, seated, in no evident distress   Head: head normocephalic and atraumatic.   Neck: supple with no carotid or supraclavicular bruits   Cardiovascular: regular rate and rhythm, no murmurs   Musculoskeletal: no deformity   Neurologic Exam   Mental Status: Awake and fully alert. Oriented to place and time. Recent and remote memory intact. Attention span, concentration and fund of knowledge appropriate. Mood labile. Occasional word finding difficulties.   Cranial Nerves: Pupils equal, briskly reactive to light. Extraocular movements full without  nystagmus. Visual fields full to confrontation. Hearing intact. Mild right face asymmetry. Facial sensation intact. Face, tongue, palate moves normally and symmetrically.   Motor: Normal bulk and tone on left. Mild right hemiparesis 4/5 with spasticity and increase tone on right shoulder. Dimishded fine finger movements on right.  Mild action tremorsl upper extremities right greater  Sensory: intact to touch and pinprick.   Coordination: Rapid alternating movements decreased on right side. Finger-to-nose and heel-to-shin performed accurately bilaterally.   Gait and Station: Arises from chair without difficulty. Stance is normal. Gait demonstrates spastic hemiplegic gait with stiffness of right leg. Romberg negative. Reflexes: 2+ and asymmetric and brisker on right. Toes downgoing.   ASSESSMENT: 65 year-old lady with left frontal MCA branch infarct in April 2014 secondary to thromboembolism from proximal left ICA occlusion treated with iv TPA with good improvement. Right ICA greater than 80 % RICA stenosis s/p PTA/stenting by Dr Corliss Skains on 11/09/12. Residual right sided hemiparesis, mild expressive aphasia, and post-stroke depression, emotional lability. Recurrent bicerebral infarcts in May 2016  PLAN:  I had a long d/w patient about her recent stroke,carotid occlusion, risk for recurrent stroke/TIAs, personally independently reviewed imaging studies and stroke evaluation results and answered questions.Continue Aggrenox  for secondary stroke prevention and maintain strict control of hypertension with blood pressure goal below 130/90, diabetes with hemoglobin A1c goal below 6.5% and lipids with LDL cholesterol goal below 70 mg/dL. I also advised the patient fall and safetty precautions. Greater than 50% time during this 25 minute visit was spent on counseling and coordination of care about stroke riskShe will return for follow-up in 6 months with stroke nurse practitioner or call earlier if necessary     11/12/2015, 10:45 PM  Delia Heady, MD [facility] [address], [address] [place], [place] [address] [phone]  Note: This document was prepared with digital dictation and possible smart phrase technology. Any transcriptional errors that result from this process are unintentional.

## 2015-11-12 NOTE — Patient Instructions (Signed)
I had a long d/w patient about her recent stroke,carotid occlusion, risk for recurrent stroke/TIAs, personally independently reviewed imaging studies and stroke evaluation results and answered questions.Continue Aggrenox  for secondary stroke prevention and maintain strict control of hypertension with blood pressure goal below 130/90, diabetes with hemoglobin A1c goal below 6.5% and lipids with LDL cholesterol goal below 70 mg/dL. I also advised the patient fall and safetty precautions. She will return for follow-up in 6 months with stroke nurse practitioner or call earlier if necessary Fall Prevention in the Home  Falls can cause injuries. They can happen to people of all ages. There are many things you can do to make your home safe and to help prevent falls.  WHAT CAN I DO ON THE OUTSIDE OF MY HOME?  Regularly fix the edges of walkways and driveways and fix any cracks.  Remove anything that might make you trip as you walk through a door, such as a raised step or threshold.  Trim any bushes or trees on the path to your home.  Use bright outdoor lighting.  Clear any walking paths of anything that might make someone trip, such as rocks or tools.  Regularly check to see if handrails are loose or broken. Make sure that both sides of any steps have handrails.  Any raised decks and porches should have guardrails on the edges.  Have any leaves, snow, or ice cleared regularly.  Use sand or salt on walking paths during winter.  Clean up any spills in your garage right away. This includes oil or grease spills. WHAT CAN I DO IN THE BATHROOM?   Use night lights.  Install grab bars by the toilet and in the tub and shower. Do not use towel bars as grab bars.  Use non-skid mats or decals in the tub or shower.  If you need to sit down in the shower, use a plastic, non-slip stool.  Keep the floor dry. Clean up any water that spills on the floor as soon as it happens.  Remove soap buildup in the  tub or shower regularly.  Attach bath mats securely with double-sided non-slip rug tape.  Do not have throw rugs and other things on the floor that can make you trip. WHAT CAN I DO IN THE BEDROOM?  Use night lights.  Make sure that you have a light by your bed that is easy to reach.  Do not use any sheets or blankets that are too big for your bed. They should not hang down onto the floor.  Have a firm chair that has side arms. You can use this for support while you get dressed.  Do not have throw rugs and other things on the floor that can make you trip. WHAT CAN I DO IN THE KITCHEN?  Clean up any spills right away.  Avoid walking on wet floors.  Keep items that you use a lot in easy-to-reach places.  If you need to reach something above you, use a strong step stool that has a grab bar.  Keep electrical cords out of the way.  Do not use floor polish or wax that makes floors slippery. If you must use wax, use non-skid floor wax.  Do not have throw rugs and other things on the floor that can make you trip. WHAT CAN I DO WITH MY STAIRS?  Do not leave any items on the stairs.  Make sure that there are handrails on both sides of the stairs and use them. Fix  handrails that are broken or loose. Make sure that handrails are as long as the stairways.  Check any carpeting to make sure that it is firmly attached to the stairs. Fix any carpet that is loose or worn.  Avoid having throw rugs at the top or bottom of the stairs. If you do have throw rugs, attach them to the floor with carpet tape.  Make sure that you have a light switch at the top of the stairs and the bottom of the stairs. If you do not have them, ask someone to add them for you. WHAT ELSE CAN I DO TO HELP PREVENT FALLS?  Wear shoes that:  Do not have high heels.  Have rubber bottoms.  Are comfortable and fit you well.  Are closed at the toe. Do not wear sandals.  If you use a stepladder:  Make sure that it  is fully opened. Do not climb a closed stepladder.  Make sure that both sides of the stepladder are locked into place.  Ask someone to hold it for you, if possible.  Clearly mark and make sure that you can see:  Any grab bars or handrails.  First and last steps.  Where the edge of each step is.  Use tools that help you move around (mobility aids) if they are needed. These include:  Canes.  Walkers.  Scooters.  Crutches.  Turn on the lights when you go into a dark area. Replace any light bulbs as soon as they burn out.  Set up your furniture so you have a clear path. Avoid moving your furniture around.  If any of your floors are uneven, fix them.  If there are any pets around you, be aware of where they are.  Review your medicines with your doctor. Some medicines can make you feel dizzy. This can increase your chance of falling. Ask your doctor what other things that you can do to help prevent falls.   This information is not intended to replace advice given to you by your health care provider. Make sure you discuss any questions you have with your health care provider.   Document Released: 04/03/2009 Document Revised: 10/22/2014 Document Reviewed: 07/12/2014 Elsevier Interactive Patient Education [employer] ions and to use walker at all times. Followup in the future with strokepractitioner in 6 months or call earlier if necessary.

## 2015-11-26 ENCOUNTER — Ambulatory Visit (INDEPENDENT_AMBULATORY_CARE_PROVIDER_SITE_OTHER): Payer: Medicare Other

## 2015-11-26 DIAGNOSIS — I6522 Occlusion and stenosis of left carotid artery: Secondary | ICD-10-CM

## 2016-05-17 ENCOUNTER — Encounter: Payer: Self-pay | Admitting: Nurse Practitioner

## 2016-05-17 ENCOUNTER — Ambulatory Visit (INDEPENDENT_AMBULATORY_CARE_PROVIDER_SITE_OTHER): Payer: Medicare Other | Admitting: Nurse Practitioner

## 2016-05-17 VITALS — BP 116/68 | HR 82 | Ht 66.0 in | Wt 175.6 lb

## 2016-05-17 DIAGNOSIS — E785 Hyperlipidemia, unspecified: Secondary | ICD-10-CM | POA: Diagnosis not present

## 2016-05-17 DIAGNOSIS — I63239 Cerebral infarction due to unspecified occlusion or stenosis of unspecified carotid arteries: Secondary | ICD-10-CM | POA: Diagnosis not present

## 2016-05-17 DIAGNOSIS — R269 Unspecified abnormalities of gait and mobility: Secondary | ICD-10-CM | POA: Diagnosis not present

## 2016-05-17 DIAGNOSIS — Z8673 Personal history of transient ischemic attack (TIA), and cerebral infarction without residual deficits: Secondary | ICD-10-CM

## 2016-05-17 DIAGNOSIS — G8110 Spastic hemiplegia affecting unspecified side: Secondary | ICD-10-CM

## 2016-05-17 DIAGNOSIS — I6930 Unspecified sequelae of cerebral infarction: Secondary | ICD-10-CM | POA: Insufficient documentation

## 2016-05-17 NOTE — Progress Notes (Addendum)
[facility]  PATIENT: Tiffany [name]. Tiffany [name] DOB: 15-Mar-1951   REASON FOR VISIT: Follow-up for history of stroke HISTORY FROM: Patient and daughter    HISTORY OF PRESENT ILLNESS:63 year Caucasian lady seen for first office f/u visit after [facility] admission on 10/16/12 With acute onset aphasia and right hemiparesis and right facial droop.NIHSS was 13 on admission and she was treated with iv TPA . CT head subsequently showed subtle hypodensity in left periinsular region without hemorrhage . MRI showed left insular and opercular cortex infarct with mild cytotoxic edema.MRA brain showed occluded left m 1 MCA.Transthoraxic echo showed normal ejection fraction.TEE showed no clot or PFO.Carotid dopplers showed left ICA occlusion and greater than 80% RICA stenosis.She has h/o transient right sided weakness in March 2014 and saw Dr Lendell Caprice a neurologist in [place] who did outpatient MRi showing small left brain infarct. MRA neck was ordered as outpatient and results were pending at the time of her stroke on 10/16/12. Vascular risk factors identified included HTN, Hyperlipidimia,CAD,Obesity, carotid occlusion and TIA.Patient was seen by therapies and transferred to inpatient rehab and has done well with good recovery of strength and speech except when she is tired when she gets words hesitancy and dragging of right leg transiently. 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 She has been compliant with her medicines and and has finished her therapy sessions. Her blood pressure is good and is 124/68 today.She walks with a cane with good balance and no recent falls. She underwent elective right ICA stenting by Dr Corliss Skains on 11/09/12 and is tolerating aspirin and plavix but has increased bruising but no bleeding.  UPDATE 06/25/13 (LL): Patient returns to office for stroke follow up. Carotid duplex completed on 03/19/13 showing  Right: Stent patent. No evidence of restenosis. Left: Occluded internal carotid artery. She has improved on the use of her right hand and right leg; is continuing to do exercises learned in therapies. She is walking short distnaces without cane. Plans to start water aerobics. She lacks fine motor control in right hand but it is getting better. Her speech is improved as well, still with expressive deficits. She has considerable depression post stroke, mourning the loss of freedom and traveling full-time job. She used to take Effexor XR 150 mg but was reduced to 75 mg daily after first TIA's occurred in April and was never switched back to 150 mg daily dose after hospitalization. She has not returned to driving, adding to her depression. She has had 2 injections to frozen shoulder and is due for a third with Dr. Larna Daughters. Bp is well controlled; is 133/86 in office today. She is tolerating Plavix and aspirin with mild bruising, no bleeding.   UPDATE 01/08/14 (LL): Since last visit, she has had continued emotional lability, even with increased dose of Venlafaxine. She cries very easily, sometimes for no apparent reason. Denies inappropriate laughter. Walking without cane now, dragging right foot less. Denies any falls. Has not been able to return to driving due to weakness of right leg, also sensory loss. She is tolerating Plavix and aspirin with mild bruising, no bleeding. Blood pressure is well controlled, is 128/74 in the office today. Continues to follow up with Dr. Wynn Banker, shoulder injections have helped.  UPDATE 05/09/14 (LL): Since last visit, she has had more difficulty with walking, and has fallen twice recently.  She is now using a rollator walker for assistance. She says her depression  is worse, her daughter agrees, and she increased her sertraline to 25 mg in the morning and night. She has some days of diarrhea, but it is not consistently every day. Her daughter states she doesn't eat like she  should and that her elimination pattern has always been variable, questionably IBS. She has been following up with Dr. Wynn BankerKirsteins for her right foot and right shoulder. She is tolerating Plavix well with no signs of significant bleeding or bruising. Blood pressure is well controlled, it is 124/71 in the office today.   Update 5/24/2017PS : She returns for follow-up after last visit in November 2015. She is accompanied by her daughter. The patient was admitted in May 2016 [facility] with another stroke. I have reviewed imaging studies personallywhich showed bilateral frontotemporal small infarcts from small vessel disease.She was on Plavix and was switched to Aggrenox She does have residual right-sided weakness and gait and balance difficulties. She walks with a walker mostly only short distances and requires one-person assist. She's had 2 falls in the last 1 year particular eventual scarring and time to walk quickly. She is tolerating Aggrenox well. She states her blood pressure is pretty good today it is 110/70 in fact Lopressor had to be discontinued. She is tolerating Lipitor well and 8 months ago the dose was increased and subsequent follow-up lipid profiles have been fine. She is still living at home with her husband. She has not had any follow-up carotid Dopplers done. UPDATE 11/27/2017[name], 65 year old female returns for follow-up with her daughter. She had an admission in May 2016 to [facility] with her second stroke her previous stroke was in 2014. She is currently on Aggrenox for secondary stroke prevention without recurrent stroke or TIA symptoms. She has minimal bruising and no bleeding She has frequent UTIs and sees a urologist. Trans  cranial Doppler 11/26/15  showed expected compensatory  changes in the brain vessels with known  previous carotid occlusion on the left no new worrisome findings. She denies any recent falls and uses a walker or cane in her home. She  continues to have residual right-sided weakness and gait and balance difficulties. She is tolerating Lipitor without muscle myalgias and she reports recent lipid panel was okay I do not have access to those results. She returns for reevaluation   REVIEW OF SYSTEMS: Full 14 system review of systems performed and notable only for those listed, all others are neg:  Constitutional: Fatigue  Cardiovascular: neg Ear/Nose/Throat: neg  Skin: neg Eyes: neg Respiratory: neg Gastroitestinal: neg  Hematology/Lymphatic: neg  Endocrine: neg Musculoskeletal: Walking difficulty, joint pain Allergy/Immunology: Frequent UTIs seen by urology  Neurological: neg Psychiatric: Depression Sleep : neg   ALLERGIES: Allergies  Allergen Reactions  . Macrobid [Nitrofurantoin Monohyd Macro] Swelling  . Benadryl [Diphenhydramine Hcl (Sleep)] Other (See Comments)    Bugs crawling sensation  . Codeine Nausea Only    HOME MEDICATIONS: Outpatient Medications Prior to Visit  Medication Sig Dispense Refill  . atorvastatin (LIPITOR) 80 MG tablet     . baclofen (LIORESAL) 10 MG tablet Take 1 tablet (10 mg total) by mouth 2 (two) times daily. 60 each 2  . buPROPion (WELLBUTRIN XL) 300 MG 24 hr tablet     . citalopram (CELEXA) 40 MG tablet Take 40 mg by mouth daily.    . clonazePAM (KLONOPIN) 0.5 MG tablet Take 0.5 mg by mouth 2 (two) times daily as needed for anxiety.     . dipyridamole-aspirin (AGGRENOX) 200-25 MG 12hr  capsule Take 1 capsule by mouth 2 (two) times daily.    . Folic Acid-Vit B6-Vit B12 (FOLGARD RX) 2.2-25-1 MG TABS Take by mouth.    . gabapentin (NEURONTIN) 300 MG capsule Take 1 capsule (300 mg total) by mouth 4 (four) times daily. 120 capsule 5  . HYDROcodone-acetaminophen (NORCO/VICODIN) 5-325 MG tablet     . mometasone (NASONEX) 50 MCG/ACT nasal spray Place 2 sprays into the nose daily. 17 g 5  . nabumetone (RELAFEN) 750 MG tablet     . omega-3 acid ethyl esters (LOVAZA) 1 g capsule       Facility-Administered Medications Prior to Visit  Medication Dose Route Frequency Provider Last Rate Last Dose  . fentaNYL (SUBLIMAZE) injection 25-50 mcg  25-50 mcg Intravenous Q5 min PRN Bedelia Person, MD        PAST MEDICAL HISTORY: Past Medical History:  Diagnosis Date  . Arthritis   . Coronary artery disease   . High cholesterol   . Hypertension   . MI (myocardial infarction)   . Stroke (HCC)   . TIA (transient ischemic attack)     PAST SURGICAL HISTORY: Past Surgical History:  Procedure Laterality Date  . CARDIAC SURGERY    . CORONARY ARTERY BYPASS GRAFT    . RADIOLOGY WITH ANESTHESIA N/A 11/09/2012   Procedure: RADIOLOGY WITH ANESTHESIA- CAROTID STENT;  Surgeon: Oneal Grout, MD;  Location: [facility] OR;  Service: Radiology;  Laterality: N/A;  . TEE WITHOUT CARDIOVERSION N/A 10/18/2012   Procedure: TRANSESOPHAGEAL ECHOCARDIOGRAM (TEE);  Surgeon: Vesta Mixer, MD;  Location: [facility] ENDOSCOPY;  Service: Cardiovascular;  Laterality: N/A;    FAMILY HISTORY: No family history on file.  SOCIAL HISTORY: Social History   Social History  . Marital status: Married    Spouse name: Perlie Gold  . Number of children: 1  . Years of education: Masters   Occupational History  . disability TEFL teacher    Social History Main Topics  . Smoking status: Former Smoker    Packs/day: 2.00    Years: 25.00    Types: Cigarettes    Quit date: 10/19/2012  . Smokeless tobacco: Never Used  . Alcohol use No  . Drug use: No  . Sexual activity: No   Other Topics Concern  . Not on file   Social History Narrative   Pt lives at home with husband Perlie Gold)   Pt is right handed   Education Masters degree   Caffeine 2 cups daily     PHYSICAL EXAM  Vitals:   05/17/16 1243  BP: 116/68  Pulse: 82  Weight: 175 lb 9.6 oz (79.7 kg)  Height: 5\' 6"  (1.676 m)   Body mass index is 28.34 kg/m. General: well developed, well nourished, seated, in no evident distress  Head: head  normocephalic and atraumatic.  Neck: supple with no carotid  bruits  Cardiovascular: regular rate and rhythm, no murmurs  Musculoskeletal: no deformity   Neurologic Exam  Mental Status: Awake and fully alert. Oriented to place and time. Recent and remote memory intact. Attention span, concentration and fund of knowledge appropriate. Mood labile. Occasional word finding difficulties.  Cranial Nerves: Pupils equal, briskly reactive to light. Extraocular movements full without nystagmus. Visual fields full to confrontation. Hearing intact. Mild right face asymmetry. Facial sensation intact. Face, tongue, palate moves normally and symmetrically.  Motor: Normal bulk and tone on left. Mild right hemiparesis 4/5 with spasticity and increase tone on right shoulder. Diminished fine finger movements on right. Mild action tremors  upper extremities right greater  Sensory: intact to touch and pinprick and vibratory in the upper and lower extremities.  Coordination: Rapid alternating movements decreased on right side. Finger-to-nose and heel-to-shin performed accurately bilaterally.  Gait and Station: Arises from chair without difficulty. Stance is wide based  Gait demonstrates spastic hemiplegic gait with stiffness of right leg. Romberg negative. Reflexes: 2+ and asymmetric and brisker on right. Toes downgoing.   DIAGNOSTIC DATA (LABS, IMAGING, TESTING)    ASSESSMENT AND PLAN 65 year-old lady with left frontal MCA branch infarct in April 2014 secondary to thromboembolism from proximal left ICA occlusion treated with iv TPA with good improvement. Right ICA greater than 80 % RICA stenosis s/p PTA/stenting by Dr Corliss Skainseveshwar on 11/09/12. Residual right sided hemiparesis, mild expressive aphasia, and post-stroke depression, emotional lability. Recurrent bicerebral infarcts in May 2016,With risk factors of high cholesterol hypertension previous stroke coronary artery disease. The patient is a current patient  of Dr.Sethi  who is out of the office today . This note is sent to the work in doctor.     PLAN:Continue Aggrenox  for secondary stroke prevention Maintain strict control of hypertension with blood pressure goal below 130/90, today's reading 116/68 lipids with LDL cholesterol goal below 70 mg/dL.  continue Lipitor  I also advised the patient fall and safetty precautions, uses walker or cane at all times Continue home exercise program as tolerated Follow up in 8 months Nilda RiggsNancy Carolyn Martin, [facility]NP, [employer], APRN  [facility] [address], [address] [place], [place] [address] [phone]  I reviewed the above note and documentation by the Nurse Practitioner and agree with the history, physical exam, assessment and plan as outlined above. I was immediately available for face-to-face consultation. Huston FoleySaima Athar, MD, PhD [facility]

## 2016-05-17 NOTE — Patient Instructions (Addendum)
Continue Aggrenox  for secondary stroke prevention Maintain strict control of hypertension with blood pressure goal below 130/90, today's reading 116/68 lipids with LDL cholesterol goal below 70 mg/dL.  continue Lipitor  I also advised the patient fall and safetty precautions, uses walker or cane at all times Continue home exercise program as tolerated Follow up in 8 months

## 2016-09-16 ENCOUNTER — Encounter: Payer: Medicare Other | Attending: Physical Medicine & Rehabilitation

## 2016-09-16 ENCOUNTER — Ambulatory Visit (HOSPITAL_BASED_OUTPATIENT_CLINIC_OR_DEPARTMENT_OTHER): Payer: Medicare Other | Admitting: Physical Medicine & Rehabilitation

## 2016-09-16 VITALS — BP 113/69 | HR 72

## 2016-09-16 DIAGNOSIS — I252 Old myocardial infarction: Secondary | ICD-10-CM | POA: Diagnosis not present

## 2016-09-16 DIAGNOSIS — M25511 Pain in right shoulder: Secondary | ICD-10-CM | POA: Insufficient documentation

## 2016-09-16 DIAGNOSIS — R269 Unspecified abnormalities of gait and mobility: Secondary | ICD-10-CM | POA: Diagnosis not present

## 2016-09-16 DIAGNOSIS — IMO0002 Reserved for concepts with insufficient information to code with codable children: Secondary | ICD-10-CM

## 2016-09-16 DIAGNOSIS — M7501 Adhesive capsulitis of right shoulder: Secondary | ICD-10-CM | POA: Diagnosis not present

## 2016-09-16 DIAGNOSIS — M1990 Unspecified osteoarthritis, unspecified site: Secondary | ICD-10-CM | POA: Diagnosis not present

## 2016-09-16 DIAGNOSIS — E7800 Pure hypercholesterolemia, unspecified: Secondary | ICD-10-CM | POA: Diagnosis not present

## 2016-09-16 DIAGNOSIS — I2510 Atherosclerotic heart disease of native coronary artery without angina pectoris: Secondary | ICD-10-CM | POA: Diagnosis not present

## 2016-09-16 DIAGNOSIS — I10 Essential (primary) hypertension: Secondary | ICD-10-CM | POA: Diagnosis not present

## 2016-09-16 DIAGNOSIS — G8111 Spastic hemiplegia affecting right dominant side: Secondary | ICD-10-CM | POA: Diagnosis not present

## 2016-09-16 DIAGNOSIS — I69351 Hemiplegia and hemiparesis following cerebral infarction affecting right dominant side: Secondary | ICD-10-CM | POA: Insufficient documentation

## 2016-09-16 DIAGNOSIS — M25611 Stiffness of right shoulder, not elsewhere classified: Secondary | ICD-10-CM | POA: Diagnosis not present

## 2016-09-16 DIAGNOSIS — I639 Cerebral infarction, unspecified: Secondary | ICD-10-CM

## 2016-09-16 NOTE — Patient Instructions (Signed)
Adhesive Capsulitis Adhesive capsulitis is inflammation of the tendons and ligaments that surround the shoulder joint (shoulder capsule). This condition causes the shoulder to become stiff and painful to move. Adhesive capsulitis is also called frozen shoulder. What are the causes? This condition may be caused by:  An injury to the shoulder joint.  Straining the shoulder.  Not moving the shoulder for a period of time. This can happen if your arm was injured or in a sling.  Long-standing health problems, such as:  Diabetes.  Thyroid problems.  Heart disease.  Stroke.  Rheumatoid arthritis.  Lung disease. In some cases, the cause may not be known. What increases the risk? This condition is more likely to develop in:  Women.  People who are older than 67 years of age. What are the signs or symptoms? Symptoms of this condition include:  Pain in the shoulder when moving the arm. There may also be pain when parts of the shoulder are touched. The pain is worse at night or when at rest.  Soreness or aching in the shoulder.  Inability to move the shoulder normally.  Muscle spasms. How is this diagnosed? This condition is diagnosed with a physical exam and imaging tests, such as an X-ray or MRI. How is this treated? This condition may be treated with:  Treatment of the underlying cause or condition.  Physical therapy. This involves performing exercises to get the shoulder moving again.  Medicine. Medicine may be given to relieve pain, inflammation, or muscle spasms.  Steroid injections into the shoulder joint.  Shoulder manipulation. This is a procedure to move the shoulder into another position. It is done after you are given a medicine to make you fall asleep (general anesthetic). The joint may also be injected with salt water at high pressure to break down scarring.  Surgery. This may be done in severe cases when other treatments have failed. Although most people  recover completely from adhesive capsulitis, some may not regain the full movement of the shoulder. Follow these instructions at home:  Take over-the-counter and prescription medicines only as told by your health care provider.  If you are being treated with physical therapy, follow instructions from your physical therapist.  Avoid exercises that put a lot of demand on your shoulder, such as throwing. These exercises can make pain worse.  If directed, apply ice to the injured area:  Put ice in a plastic bag.  Place a towel between your skin and the bag.  Leave the ice on for 20 minutes, 2-3 times per day. Contact a health care provider if:  You develop new symptoms.  Your symptoms get worse. This information is not intended to replace advice given to you by your health care provider. Make sure you discuss any questions you have with your health care provider. Document Released: 04/04/2009 Document Revised: 11/13/2015 Document Reviewed: 09/30/2014 Elsevier Interactive Patient Education  2017 Elsevier Inc.  

## 2016-09-16 NOTE — Progress Notes (Signed)
Subjective:    Patient ID: Tiffany [name]. Mair, female    DOB: 30-Oct-1950, 66 y.o.   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 Interval medical history significant for chronic diarrhea which turned out to be related to Fish oil. Also, has had low back pain with right lower extremity pain. Has been evaluated by physical medicine and [facility] and responded well to L4 transforaminal injection on the right. Unfortunately, patient fell about 2 weeks after injection and aggravated her back pain which radiated to the right lower extremity  Seen by Dr Retia Passe R L4 ESI, helped but had a fall ~2wk which aggravated pain  Patient has 2 complaints today. Right shoulder tightness with difficulty using her right upper extremity when trying to reach her head, brush her hair, etc. No falls or other trauma. This was the side affected by her stroke. She has some numbness and hand pain, as well as upper arm pain.  Another complaint is that her right great toe sticks out. This makes it difficult to don shoes and socks.   Pain Inventory Average Pain 7 Pain Right Now 5 My pain is sharp, stabbing, tingling and aching  In the last 24 hours, has pain interfered with the following? General activity 5 Relation with others 1 Enjoyment of life 4 What TIME of day is your pain at its worst? evening Sleep (in general) Fair  Pain is worse with: walking, sitting, inactivity, standing and some activites Pain improves with: medication and injections Relief from Meds: 4  Mobility walk with assistance use a cane use a walker ability to climb steps?  no do you drive?  no use a wheelchair needs help with transfers transfers alone  Function retired I need assistance with the following:  bathing and household duties  Neuro/Psych No problems in this  area  Prior Studies Any changes since last visit?  no EXAM: MRI LUMBAR SPINE WITHOUT CONTRAST  TECHNIQUE: Multiplanar, multisequence MR imaging of the lumbar spine was performed. No intravenous contrast was administered.  COMPARISON:  None.  FINDINGS: Normal conus tip at L1-2. Slight lumbar scoliosis with convexity to the right centered at L2-3. Otherwise normal paraspinal soft tissues. Diverticulosis of the distal colon.  T12-L1:  Normal.  L1-2: Disc desiccation with disc space narrowing. Mid disc bulge with accompanying osteophytes impingement.  L2-3:  Normal.  L3-4:  Normal.  L4-5: Tiny broad-based disc bulge with no neural impingement. Slight bilateral facet arthritis.  L5-S1: 1 mm spondylolisthesis. Disc is normal. Severe right facet arthritis. Minimal left facet arthritis.  IMPRESSION: Severe right facet arthritis at L5-S1. Slight bilateral facet arthritis at L4-5. No other significant abnormality.   Electronically Signed   By: Francene Boyers M.D.   On: 11/03/2015 16:56 Physicians involved in your care Any changes since last visit?  no   No family history on file. Social History   Social History  . Marital status: Married    Spouse name: Perlie Gold  . Number of children: 1  . Years of education: Masters   Occupational History  . disability TEFL teacher    Social History Main Topics  . Smoking status: Former Smoker    Packs/day: 2.00    Years: 25.00    Types: Cigarettes    Quit date: 10/19/2012  . Smokeless tobacco: Never Used  . Alcohol use No  .  Drug use: No  . Sexual activity: No   Other Topics Concern  . Not on file   Social History Narrative   Pt lives at home with husband Perlie Gold)   Pt is right handed   Education Masters degree   Caffeine 2 cups daily   Past Surgical History:  Procedure Laterality Date  . CARDIAC SURGERY    . CORONARY ARTERY BYPASS GRAFT    . RADIOLOGY WITH ANESTHESIA N/A 11/09/2012    Procedure: RADIOLOGY WITH ANESTHESIA- CAROTID STENT;  Surgeon: Oneal Grout, MD;  Location: [facility] OR;  Service: Radiology;  Laterality: N/A;  . TEE WITHOUT CARDIOVERSION N/A 10/18/2012   Procedure: TRANSESOPHAGEAL ECHOCARDIOGRAM (TEE);  Surgeon: Vesta Mixer, MD;  Location: [facility] ENDOSCOPY;  Service: Cardiovascular;  Laterality: N/A;   Past Medical History:  Diagnosis Date  . Arthritis   . Coronary artery disease   . High cholesterol   . Hypertension   . MI (myocardial infarction)   . Stroke (HCC)   . TIA (transient ischemic attack)    There were no vitals taken for this visit.  Opioid Risk Score:   Fall Risk Score:  `1  Depression screen PHQ 2/9  Depression screen [facility] 2/9 01/07/2015 11/05/2014  Decreased Interest 3 1  Down, Depressed, Hopeless 1 1  PHQ - 2 Score 4 2  Altered sleeping 0 3  Tired, decreased energy 3 3  Change in appetite 2 2  Feeling bad or failure about yourself  3 0  Trouble concentrating 3 1  Moving slowly or fidgety/restless 0 2  Suicidal thoughts 0 0  PHQ-9 Score 15 13  Difficult doing work/chores Very difficult -    Review of Systems  Constitutional: Negative.   HENT: Negative.   Eyes: Negative.   Respiratory: Negative.   Cardiovascular: Negative.   Gastrointestinal: Negative.   Endocrine: Negative.   Genitourinary: Negative.   Musculoskeletal: Negative.   Skin: Negative.   Allergic/Immunologic: Negative.   Neurological: Negative.   Hematological: Negative.   Psychiatric/Behavioral: Negative.   All other systems reviewed and are negative.      Objective:   Physical Exam  Constitutional: She is oriented to person, place, and time. She appears well-developed and well-nourished.  HENT:  Head: Normocephalic and atraumatic.  Eyes: Pupils are equal, round, and reactive to light.  Neurological: She is alert and oriented to person, place, and time.  Right foot hyperactive. Babinski She has foot inversion with standing on the right  side. No clonus at the ankle. No proximal right lower extremity spasticity. Right upper extremity 3 minus strength at the deltoid, 4 minus. Biceps, triceps, finger flexors and extensors. 4 minus, right hip flexor, knee extensor, 3 minus. Ankle dorsiflexor, plantar flexor. Ambulates short distances without assistive device. She does have inversion as well as toe extension right foot.   Psychiatric: She has a normal mood and affect.  Nursing note and vitals reviewed.         Assessment & Plan:   1.  Right spastic hemiparesis with Right foot inversion and hyperactive babinski  Botox  EHL 50U Tib post 50U  2. Right frozen shoulder, discussed that at this point. It's difficult to regain range. We can inject glenohumeral joint with corticosteroid, reassess in 3 weeks, consider PT  Shoulder injection Right glenohumeral   Indication: Right Shoulder pain not relieved by medication management and other conservative care.  Informed consent was obtained after describing risks and benefits of the procedure with the patient, this includes bleeding, bruising,  infection and medication side effects. The patient wishes to proceed and has given written consent. Patient was placed in a seated position. The Right shoulder was marked and prepped with betadine in the subacromial area. A 25-gauge 1-1/2 inch needle was inserted into the subacromial area. After negative draw back for blood, a solution containing 1 mL of 6 mg per ML betamethasone and 4 mL of 1% lidocaine was injected. A band aid was applied. The patient tolerated the procedure well. Post procedure instructions were given.

## 2016-10-15 ENCOUNTER — Encounter: Payer: Self-pay | Admitting: Physical Medicine & Rehabilitation

## 2016-10-15 ENCOUNTER — Encounter: Payer: Medicare Other | Attending: Physical Medicine & Rehabilitation

## 2016-10-15 ENCOUNTER — Ambulatory Visit (HOSPITAL_BASED_OUTPATIENT_CLINIC_OR_DEPARTMENT_OTHER): Payer: Medicare Other | Admitting: Physical Medicine & Rehabilitation

## 2016-10-15 VITALS — BP 107/72 | HR 80 | Resp 14

## 2016-10-15 DIAGNOSIS — G8110 Spastic hemiplegia affecting unspecified side: Secondary | ICD-10-CM | POA: Diagnosis not present

## 2016-10-15 DIAGNOSIS — I69351 Hemiplegia and hemiparesis following cerebral infarction affecting right dominant side: Secondary | ICD-10-CM | POA: Insufficient documentation

## 2016-10-15 DIAGNOSIS — I10 Essential (primary) hypertension: Secondary | ICD-10-CM | POA: Insufficient documentation

## 2016-10-15 DIAGNOSIS — M25611 Stiffness of right shoulder, not elsewhere classified: Secondary | ICD-10-CM | POA: Diagnosis not present

## 2016-10-15 DIAGNOSIS — E7800 Pure hypercholesterolemia, unspecified: Secondary | ICD-10-CM | POA: Diagnosis not present

## 2016-10-15 DIAGNOSIS — I2510 Atherosclerotic heart disease of native coronary artery without angina pectoris: Secondary | ICD-10-CM | POA: Diagnosis not present

## 2016-10-15 DIAGNOSIS — M25511 Pain in right shoulder: Secondary | ICD-10-CM | POA: Insufficient documentation

## 2016-10-15 DIAGNOSIS — M1990 Unspecified osteoarthritis, unspecified site: Secondary | ICD-10-CM | POA: Insufficient documentation

## 2016-10-15 DIAGNOSIS — I252 Old myocardial infarction: Secondary | ICD-10-CM | POA: Insufficient documentation

## 2016-10-15 NOTE — Progress Notes (Signed)
Botox Injection for spasticity using needle EMG guidance  Dilution: 50 Units/ml Indication: Severe spasticity which interferes with ADL,mobility and/or  hygiene and is unresponsive to medication management and other conservative care Informed consent was obtained after describing risks and benefits of the procedure with the patient. This includes bleeding, bruising, infection, excessive weakness, or medication side effects. A REMS form is on file and signed. Needle: 25g 2" needle electrode Number of units per muscle EHL 50U Tib post 50U All injections were done after obtaining appropriate EMG activity and after negative drawback for blood. The patient tolerated the procedure well. Post procedure instructions were given. A followup appointment was made.

## 2016-10-15 NOTE — Patient Instructions (Signed)

## 2016-11-02 ENCOUNTER — Telehealth: Payer: Self-pay

## 2016-11-02 NOTE — Telephone Encounter (Signed)
Patient called, states from where she was injected on last visit of Botox there is now a red rash on area and entire leg is stiff, please advise

## 2016-11-02 NOTE — Telephone Encounter (Signed)
I haven't seen that side effect with Botox and it is occurring a couple weeks after the injection. I would be happy to see the patient in the clinic for follow-up this week , or she can go to her PCP.

## 2016-11-03 NOTE — Telephone Encounter (Signed)
Called patient back to give options, states redness has dissipated gotten better but is still somewhat stiff but is not as concerned, told her to watch it on the leg and if symptoms stop improving or is still concerned to call us back and we can see her sooner or follow up with her PCP, patient agreed and will let us know

## 2016-11-26 ENCOUNTER — Ambulatory Visit: Payer: Medicare Other | Admitting: Physical Medicine & Rehabilitation

## 2017-01-13 ENCOUNTER — Ambulatory Visit: Payer: Medicare Other | Admitting: Nurse Practitioner

## 2017-02-05 ENCOUNTER — Encounter (HOSPITAL_COMMUNITY): Payer: Self-pay

## 2017-02-05 ENCOUNTER — Emergency Department (HOSPITAL_COMMUNITY): Payer: Medicare Other

## 2017-02-05 ENCOUNTER — Inpatient Hospital Stay (HOSPITAL_COMMUNITY)
Admission: EM | Admit: 2017-02-05 | Discharge: 2017-02-07 | DRG: 872 | Disposition: A | Discharge status: Home Health | Payer: Medicare Other | Attending: Nephrology | Admitting: Nephrology

## 2017-02-05 ENCOUNTER — Inpatient Hospital Stay (HOSPITAL_COMMUNITY): Payer: Medicare Other

## 2017-02-05 DIAGNOSIS — F418 Other specified anxiety disorders: Secondary | ICD-10-CM | POA: Diagnosis not present

## 2017-02-05 DIAGNOSIS — I6930 Unspecified sequelae of cerebral infarction: Secondary | ICD-10-CM

## 2017-02-05 DIAGNOSIS — I2510 Atherosclerotic heart disease of native coronary artery without angina pectoris: Secondary | ICD-10-CM | POA: Diagnosis present

## 2017-02-05 DIAGNOSIS — K5730 Diverticulosis of large intestine without perforation or abscess without bleeding: Secondary | ICD-10-CM | POA: Diagnosis present

## 2017-02-05 DIAGNOSIS — N390 Urinary tract infection, site not specified: Secondary | ICD-10-CM | POA: Diagnosis not present

## 2017-02-05 DIAGNOSIS — R112 Nausea with vomiting, unspecified: Secondary | ICD-10-CM | POA: Diagnosis not present

## 2017-02-05 DIAGNOSIS — R319 Hematuria, unspecified: Secondary | ICD-10-CM

## 2017-02-05 DIAGNOSIS — E876 Hypokalemia: Secondary | ICD-10-CM | POA: Diagnosis not present

## 2017-02-05 DIAGNOSIS — A419 Sepsis, unspecified organism: Secondary | ICD-10-CM | POA: Diagnosis present

## 2017-02-05 DIAGNOSIS — R1310 Dysphagia, unspecified: Secondary | ICD-10-CM | POA: Diagnosis not present

## 2017-02-05 DIAGNOSIS — E785 Hyperlipidemia, unspecified: Secondary | ICD-10-CM | POA: Diagnosis not present

## 2017-02-05 DIAGNOSIS — G8110 Spastic hemiplegia affecting unspecified side: Secondary | ICD-10-CM | POA: Diagnosis present

## 2017-02-05 DIAGNOSIS — I6523 Occlusion and stenosis of bilateral carotid arteries: Secondary | ICD-10-CM | POA: Diagnosis not present

## 2017-02-05 DIAGNOSIS — Z888 Allergy status to other drugs, medicaments and biological substances status: Secondary | ICD-10-CM

## 2017-02-05 DIAGNOSIS — I69351 Hemiplegia and hemiparesis following cerebral infarction affecting right dominant side: Secondary | ICD-10-CM

## 2017-02-05 DIAGNOSIS — J029 Acute pharyngitis, unspecified: Secondary | ICD-10-CM

## 2017-02-05 DIAGNOSIS — I110 Hypertensive heart disease with heart failure: Secondary | ICD-10-CM | POA: Diagnosis present

## 2017-02-05 DIAGNOSIS — I252 Old myocardial infarction: Secondary | ICD-10-CM

## 2017-02-05 DIAGNOSIS — J069 Acute upper respiratory infection, unspecified: Secondary | ICD-10-CM

## 2017-02-05 DIAGNOSIS — I5032 Chronic diastolic (congestive) heart failure: Secondary | ICD-10-CM | POA: Diagnosis present

## 2017-02-05 DIAGNOSIS — Z87891 Personal history of nicotine dependence: Secondary | ICD-10-CM

## 2017-02-05 DIAGNOSIS — I4581 Long QT syndrome: Secondary | ICD-10-CM | POA: Diagnosis present

## 2017-02-05 DIAGNOSIS — Z951 Presence of aortocoronary bypass graft: Secondary | ICD-10-CM | POA: Diagnosis not present

## 2017-02-05 DIAGNOSIS — Z885 Allergy status to narcotic agent status: Secondary | ICD-10-CM

## 2017-02-05 DIAGNOSIS — F329 Major depressive disorder, single episode, unspecified: Secondary | ICD-10-CM | POA: Diagnosis not present

## 2017-02-05 DIAGNOSIS — R197 Diarrhea, unspecified: Secondary | ICD-10-CM | POA: Diagnosis not present

## 2017-02-05 DIAGNOSIS — F419 Anxiety disorder, unspecified: Secondary | ICD-10-CM | POA: Diagnosis not present

## 2017-02-05 DIAGNOSIS — Z9181 History of falling: Secondary | ICD-10-CM

## 2017-02-05 DIAGNOSIS — N10 Acute tubulo-interstitial nephritis: Secondary | ICD-10-CM | POA: Diagnosis not present

## 2017-02-05 DIAGNOSIS — Z881 Allergy status to other antibiotic agents status: Secondary | ICD-10-CM

## 2017-02-05 LAB — CBC WITH DIFFERENTIAL/PLATELET
Basophils Absolute: 0 10*3/uL (ref 0.0–0.1)
Basophils Relative: 0 %
Eosinophils Absolute: 0.2 10*3/uL (ref 0.0–0.7)
Eosinophils Relative: 1 %
HCT: 42.3 % (ref 36.0–46.0)
Hemoglobin: 13.8 g/dL (ref 12.0–15.0)
Lymphocytes Relative: 7 %
Lymphs Abs: 1 10*3/uL (ref 0.7–4.0)
MCH: 30.9 pg (ref 26.0–34.0)
MCHC: 32.6 g/dL (ref 30.0–36.0)
MCV: 94.6 fL (ref 78.0–100.0)
Monocytes Absolute: 1 10*3/uL (ref 0.1–1.0)
Monocytes Relative: 7 %
Neutro Abs: 11.8 10*3/uL — ABNORMAL HIGH (ref 1.7–7.7)
Neutrophils Relative %: 85 %
Platelets: 180 10*3/uL (ref 150–400)
RBC: 4.47 MIL/uL (ref 3.87–5.11)
RDW: 14 % (ref 11.5–15.5)
WBC: 13.9 10*3/uL — ABNORMAL HIGH (ref 4.0–10.5)

## 2017-02-05 LAB — COMPREHENSIVE METABOLIC PANEL
ALT: 12 U/L — ABNORMAL LOW (ref 14–54)
AST: 22 U/L (ref 15–41)
Albumin: 3.1 g/dL — ABNORMAL LOW (ref 3.5–5.0)
Alkaline Phosphatase: 63 U/L (ref 38–126)
Anion gap: 12 (ref 5–15)
BUN: 13 mg/dL (ref 6–20)
CO2: 23 mmol/L (ref 22–32)
Calcium: 8.1 mg/dL — ABNORMAL LOW (ref 8.9–10.3)
Chloride: 100 mmol/L — ABNORMAL LOW (ref 101–111)
Creatinine, Ser: 1 mg/dL (ref 0.44–1.00)
GFR calc Af Amer: >60 mL/min (ref >60)
GFR calc non Af Amer: 57 mL/min — ABNORMAL LOW (ref >60)
Glucose, Bld: 103 mg/dL — ABNORMAL HIGH (ref 65–99)
Potassium: 3.6 mmol/L (ref 3.5–5.1)
Sodium: 135 mmol/L (ref 135–145)
Total Bilirubin: 0.9 mg/dL (ref 0.3–1.2)
Total Protein: 5.8 g/dL — ABNORMAL LOW (ref 6.5–8.1)

## 2017-02-05 LAB — I-STAT CG4 LACTIC ACID, ED
Lactic Acid, Venous: 2.6 mmol/L (ref 0.5–1.9)
Lactic Acid, Venous: 1.44 mmol/L (ref 0.5–1.9)

## 2017-02-05 LAB — URINALYSIS, ROUTINE W REFLEX MICROSCOPIC
Bilirubin Urine: NEGATIVE
Glucose, UA: NEGATIVE mg/dL
Hgb urine dipstick: NEGATIVE
Ketones, ur: NEGATIVE mg/dL
Leukocytes, UA: NEGATIVE
Nitrite: POSITIVE — AB
Protein, ur: NEGATIVE mg/dL
Specific Gravity, Urine: 1.021 (ref 1.005–1.030)
pH: 5 (ref 5.0–8.0)

## 2017-02-05 LAB — LACTIC ACID, PLASMA: Lactic Acid, Venous: 1.6 mmol/L (ref 0.5–1.9)

## 2017-02-05 LAB — RAPID STREP SCREEN (MED CTR MEBANE ONLY): Streptococcus, Group A Screen (Direct): NEGATIVE

## 2017-02-05 LAB — PROCALCITONIN: Procalcitonin: 0.61 ng/mL

## 2017-02-05 MED ORDER — IOPAMIDOL (ISOVUE-300) INJECTION 61%
INTRAVENOUS | Status: AC
  Administered 2017-02-05: 100 mL
  Filled 2017-02-05: qty 100

## 2017-02-05 MED ORDER — ONDANSETRON HCL 4 MG/2ML IJ SOLN: 4.0000 mg | Freq: Four times a day (QID) | INTRAMUSCULAR | Status: DC | PRN

## 2017-02-05 MED ORDER — CITALOPRAM HYDROBROMIDE 40 MG PO TABS
40.0000 mg | ORAL_TABLET | Freq: Every morning | ORAL | Status: DC
  Administered 2017-02-06 – 2017-02-07 (×2): 40 mg via ORAL
  Filled 2017-02-05 (×2): qty 1

## 2017-02-05 MED ORDER — SODIUM CHLORIDE 0.9 % IV SOLN
INTRAVENOUS | Status: DC
  Administered 2017-02-05 – 2017-02-06 (×2): via INTRAVENOUS

## 2017-02-05 MED ORDER — ASPIRIN-DIPYRIDAMOLE ER 25-200 MG PO CP12
1.0000 | ORAL_CAPSULE | Freq: Two times a day (BID) | ORAL | Status: DC
  Administered 2017-02-06 – 2017-02-07 (×4): 1 via ORAL
  Filled 2017-02-05 (×5): qty 1

## 2017-02-05 MED ORDER — DEXTROSE 5 % IV SOLN
1.0000 g | INTRAVENOUS | Status: DC
  Filled 2017-02-05: qty 10

## 2017-02-05 MED ORDER — DEXTROSE 5 % IV SOLN
1.0000 g | INTRAVENOUS | Status: DC
  Administered 2017-02-06 – 2017-02-07 (×2): 1 g via INTRAVENOUS
  Filled 2017-02-05 (×2): qty 10

## 2017-02-05 MED ORDER — VANCOMYCIN HCL 10 G IV SOLR: 1250.0000 mg | INTRAVENOUS | Status: DC

## 2017-02-05 MED ORDER — ENOXAPARIN SODIUM 40 MG/0.4ML ~~LOC~~ SOLN
40.0000 mg | SUBCUTANEOUS | Status: DC
  Administered 2017-02-06 – 2017-02-07 (×2): 40 mg via SUBCUTANEOUS
  Filled 2017-02-05 (×3): qty 0.4

## 2017-02-05 MED ORDER — BUPROPION HCL ER (XL) 150 MG PO TB24
300.0000 mg | ORAL_TABLET | Freq: Every day | ORAL | Status: DC
  Administered 2017-02-06 (×2): 300 mg via ORAL
  Filled 2017-02-05 (×2): qty 2

## 2017-02-05 MED ORDER — GABAPENTIN 300 MG PO CAPS
300.0000 mg | ORAL_CAPSULE | Freq: Three times a day (TID) | ORAL | Status: DC
  Administered 2017-02-06 – 2017-02-07 (×6): 300 mg via ORAL
  Filled 2017-02-05 (×6): qty 1

## 2017-02-05 MED ORDER — ATORVASTATIN CALCIUM 80 MG PO TABS
80.0000 mg | ORAL_TABLET | Freq: Every day | ORAL | Status: DC
  Administered 2017-02-06: 80 mg via ORAL
  Filled 2017-02-05: qty 1

## 2017-02-05 MED ORDER — FLUTICASONE PROPIONATE 50 MCG/ACT NA SUSP
2.0000 | Freq: Every day | NASAL | Status: DC | PRN
  Filled 2017-02-05: qty 16

## 2017-02-05 MED ORDER — NABUMETONE 500 MG PO TABS
750.0000 mg | ORAL_TABLET | Freq: Every day | ORAL | Status: DC
  Administered 2017-02-06 – 2017-02-07 (×3): 750 mg via ORAL
  Filled 2017-02-05 (×3): qty 2

## 2017-02-05 MED ORDER — ACETAMINOPHEN 325 MG PO TABS
650.0000 mg | ORAL_TABLET | Freq: Once | ORAL | Status: AC
  Administered 2017-02-05: 650 mg via ORAL
  Filled 2017-02-05: qty 2

## 2017-02-05 MED ORDER — PIPERACILLIN-TAZOBACTAM 3.375 G IVPB 30 MIN
3.3750 g | Freq: Once | INTRAVENOUS | Status: AC
  Administered 2017-02-05: 3.375 g via INTRAVENOUS
  Filled 2017-02-05: qty 50

## 2017-02-05 MED ORDER — SODIUM CHLORIDE 0.9 % IV BOLUS (SEPSIS)
500.0000 mL | Freq: Once | INTRAVENOUS | Status: AC
  Administered 2017-02-05: 500 mL via INTRAVENOUS

## 2017-02-05 MED ORDER — MENTHOL 3 MG MT LOZG
1.0000 | LOZENGE | OROMUCOSAL | Status: DC | PRN
  Filled 2017-02-05: qty 9

## 2017-02-05 MED ORDER — VANCOMYCIN HCL 10 G IV SOLR
1500.0000 mg | Freq: Once | INTRAVENOUS | Status: AC
  Administered 2017-02-05: 1500 mg via INTRAVENOUS
  Filled 2017-02-05: qty 1500

## 2017-02-05 MED ORDER — PIPERACILLIN-TAZOBACTAM 3.375 G IVPB: 3.3750 g | Freq: Three times a day (TID) | INTRAVENOUS | Status: DC

## 2017-02-05 MED ORDER — ACETAMINOPHEN 650 MG RE SUPP: 650.0000 mg | Freq: Four times a day (QID) | RECTAL | Status: DC | PRN

## 2017-02-05 MED ORDER — LORATADINE 10 MG PO TABS: 10.0000 mg | ORAL_TABLET | Freq: Every day | ORAL | Status: DC | PRN

## 2017-02-05 MED ORDER — SODIUM CHLORIDE 0.9 % IV BOLUS (SEPSIS)
1000.0000 mL | Freq: Once | INTRAVENOUS | Status: AC
  Administered 2017-02-05: 1000 mL via INTRAVENOUS

## 2017-02-05 MED ORDER — ONDANSETRON HCL 4 MG PO TABS: 4.0000 mg | ORAL_TABLET | Freq: Four times a day (QID) | ORAL | Status: DC | PRN

## 2017-02-05 MED ORDER — CLONAZEPAM 0.5 MG PO TABS: 0.5000 mg | ORAL_TABLET | Freq: Two times a day (BID) | ORAL | Status: DC | PRN

## 2017-02-05 MED ORDER — ACETAMINOPHEN 325 MG PO TABS
650.0000 mg | ORAL_TABLET | Freq: Four times a day (QID) | ORAL | Status: DC | PRN
  Administered 2017-02-07: 650 mg via ORAL
  Filled 2017-02-05: qty 2

## 2017-02-05 MED ORDER — BACLOFEN 10 MG PO TABS
10.0000 mg | ORAL_TABLET | Freq: Two times a day (BID) | ORAL | Status: DC
  Administered 2017-02-06 – 2017-02-07 (×4): 10 mg via ORAL
  Filled 2017-02-05 (×4): qty 1

## 2017-02-05 MED ORDER — DEXTROSE 5 % IV SOLN: 2.0000 g | INTRAVENOUS | Status: DC

## 2017-02-05 NOTE — ED Notes (Signed)
Delay in lab draw,  Pt receiving peri care at this time. 

## 2017-02-05 NOTE — ED Provider Notes (Signed)
[facility]-EMERGENCY DEPT Provider Note   CSN: [id number] Arrival date & time: 02/05/17  1413     History   Chief Complaint Chief Complaint  Patient presents with  . Sore Throat  . Fever    HPI Tiffany [name] is a 66 y.o. female.  HPI   66 year old female presents today with complaints of nausea, vomiting, diarrhea, dysuria.  Patient notes over the last several days she has had nausea vomiting and diarrhea.  She reports 4 episodes of diarrhea yesterday, nonbloody.  Patient denies any vomiting or diarrhea today.  Patient notes today she developed sore throat and fever.  Family notes some confusion today reporting she thought it was 1921, patient notes she was slightly confused yesterday as well.  Patient notes increased urination, but no dysuria.  She denies any neck stiffness, abdominal pain, rashes, recent antibiotic exposure, or exposure to abnormal food or drinks.   Past Medical History:  Diagnosis Date  . Arthritis   . Coronary artery disease   . High cholesterol   . Hypertension   . MI (myocardial infarction) (HCC)   . Stroke (HCC)   . TIA (transient ischemic attack)     Patient Active Problem List   Diagnosis Date Noted  . History of CVA (cerebrovascular accident) 05/17/2016  . Abnormality of gait 05/17/2016  . 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 Spastic hemiplegia affecting dominant side (HCC) 12/01/2012  . Dysphasia, late effect of cerebrovascular disease 12/01/2012  . Carotid stenosis, symptomatic, with infarction (HCC) 11/10/2012  . Muscle spasms of lower extremity 11/09/2012  . Cerebral infarction (HCC) 10/23/2012  . Depression 10/23/2012  . Dyslipidemia 10/23/2012    Past Surgical History:  Procedure Laterality Date  . CARDIAC SURGERY    . CORONARY ARTERY BYPASS GRAFT    . RADIOLOGY WITH ANESTHESIA N/A 11/09/2012   Procedure: RADIOLOGY WITH ANESTHESIA- CAROTID STENT;  Surgeon: Oneal Grout, MD;  Location: [facility] OR;  Service:  Radiology;  Laterality: N/A;  . TEE WITHOUT CARDIOVERSION N/A 10/18/2012   Procedure: TRANSESOPHAGEAL ECHOCARDIOGRAM (TEE);  Surgeon: Vesta Mixer, MD;  Location: [facility] ENDOSCOPY;  Service: Cardiovascular;  Laterality: N/A;    OB History    No data available       Home Medications    Prior to Admission medications   Medication Sig Start Date End Date Taking? Authorizing Provider  atorvastatin (LIPITOR) 80 MG tablet  11/06/15   [provider]  baclofen (LIORESAL) 10 MG tablet Take 1 tablet (10 mg total) by mouth 2 (two) times daily. 06/27/15   Kirsteins, Victorino Sparrow, MD  buPROPion (WELLBUTRIN XL) 300 MG 24 hr tablet  11/03/15   [provider]  citalopram (CELEXA) 40 MG tablet Take 40 mg by mouth daily.    [provider]  clonazePAM (KLONOPIN) 0.5 MG tablet Take 0.5 mg by mouth 2 (two) times daily as needed for anxiety.     [provider]  dipyridamole-aspirin (AGGRENOX) 200-25 MG 12hr capsule Take 1 capsule by mouth 2 (two) times daily.    [provider]  gabapentin (NEURONTIN) 300 MG capsule Take 1 capsule (300 mg total) by mouth 4 (four) times daily. 10/16/15   Kirsteins, Victorino Sparrow, MD  mometasone (NASONEX) 50 MCG/ACT nasal spray Place 2 sprays into the nose daily. 05/09/14   Ronal Fear, NP  nabumetone (RELAFEN) 750 MG tablet  10/20/15   [provider]    Family History No family history on file.  Social History Social History  Substance Use Topics  . Smoking status: Former Smoker    Packs/day: 2.00    Years: 25.00    Types: Cigarettes    Quit date: 10/19/2012  . Smokeless tobacco: Never Used  . Alcohol use No     Allergies   Macrobid [nitrofurantoin monohyd macro]; Benadryl [diphenhydramine hcl (sleep)]; and Codeine   Review of Systems Review of Systems  All other systems reviewed and are negative.  Physical Exam Updated Vital Signs BP 99/64   Pulse (!) 105   Temp (!) 101.9 F (38.8 C) (Rectal)   Resp (!) 23    Wt 79.4 kg (175 lb)   SpO2 92%   BMI 28.25 kg/m   Physical Exam  Constitutional: She is oriented to person, place, and time. She appears well-developed and well-nourished.  HENT:  Head: Normocephalic and atraumatic.  Mouth/Throat: Uvula is midline and oropharynx is clear and moist. No oropharyngeal exudate, posterior oropharyngeal edema, posterior oropharyngeal erythema or tonsillar abscesses.  No pooling of secretions  Eyes: Pupils are equal, round, and reactive to light. Conjunctivae are normal. Right eye exhibits no discharge. Left eye exhibits no discharge. No scleral icterus.  Neck: Normal range of motion. No JVD present. No tracheal deviation present.  Cardiovascular: Regular rhythm.   Pulmonary/Chest: Effort normal and breath sounds normal. No stridor. No respiratory distress. She has no wheezes. She has no rales. She exhibits no tenderness.  Abdominal: Soft. She exhibits no distension and no mass. There is no tenderness. There is no rebound and no guarding. No hernia.  No CVAT  Neurological: She is alert and oriented to person, place, and time. Coordination normal.  Psychiatric: She has a normal mood and affect. Her behavior is normal. Judgment and thought content normal.  Nursing note and vitals reviewed.   ED Treatments / Results  Labs (all labs ordered are listed, but only abnormal results are displayed) Labs Reviewed  COMPREHENSIVE METABOLIC PANEL - Abnormal; Notable for the following:       Result Value   Chloride 100 (*)    Glucose, Bld 103 (*)    Calcium 8.1 (*)    Total Protein 5.8 (*)    Albumin 3.1 (*)    ALT 12 (*)    GFR calc non Af Amer 57 (*)    All other components within normal limits  CBC WITH DIFFERENTIAL/PLATELET - Abnormal; Notable for the following:    WBC 13.9 (*)    Neutro Abs 11.8 (*)    All other components within normal limits  URINALYSIS, ROUTINE W REFLEX MICROSCOPIC - Abnormal; Notable for the following:    APPearance HAZY (*)    Nitrite  POSITIVE (*)    Bacteria, UA MANY (*)    Squamous Epithelial / LPF 0-5 (*)    All other components within normal limits  I-STAT CG4 LACTIC ACID, ED - Abnormal; Notable for the following:    Lactic Acid, Venous 2.60 (*)    All other components within normal limits  RAPID STREP SCREEN (NOT AT [facility])  CULTURE, BLOOD (ROUTINE X 2)  CULTURE, BLOOD (ROUTINE X 2)  CULTURE, GROUP A STREP ([facility])  GASTROINTESTINAL PANEL BY PCR, STOOL (REPLACES STOOL CULTURE)  I-STAT CG4 LACTIC ACID, ED    EKG  EKG Interpretation None      Radiology Dg Chest 2 View  Result Date: 02/05/2017 CLINICAL DATA:  Fever and sore throat for 2 days, initial encounter EXAM: CHEST  2 VIEW COMPARISON:  09/09/2016 FINDINGS: Cardiac shadow is within normal limits.  The lungs are well aerated bilaterally. No focal infiltrate or sizable effusion is seen. No acute bony abnormality is noted. IMPRESSION: No active cardiopulmonary disease. Electronically Signed   By: Alcide Clever M.D.   On: 02/05/2017 18:52   Ct Abdomen Pelvis W Contrast  Result Date: 02/05/2017 CLINICAL DATA:  Nausea vomiting diarrhea and fever for 2 days. EXAM: CT ABDOMEN AND PELVIS WITH CONTRAST TECHNIQUE: Multidetector CT imaging of the abdomen and pelvis was performed using the standard protocol following bolus administration of intravenous contrast. CONTRAST:  ISOVUE-300 IOPAMIDOL (ISOVUE-300) INJECTION 61% COMPARISON:  None. FINDINGS: Lower chest: No acute abnormality. Hepatobiliary: No focal liver abnormality is seen. No gallstones, gallbladder wall thickening, or biliary dilatation. Pancreas: Unremarkable. No pancreatic ductal dilatation or surrounding inflammatory changes. Spleen: Normal in size without focal abnormality. Adrenals/Urinary Tract: Adrenal glands are unremarkable. Kidneys are without renal calculi, focal lesion, or hydronephrosis. Bilateral perirenal fat stranding, nonspecific. Bladder is unremarkable. Stomach/Bowel: Stomach is within normal  limits. Tiny hiatal hernia. Appendix appears normal. No evidence of bowel wall thickening, distention, or inflammatory changes. 3.4 cm low-attenuation elongated structure abuts or arises from the second portion of the duodenum, axial image 33/91, coronal image 57/151. Scattered left colonic diverticulosis without evidence of diverticulitis. Vascular/Lymphatic: Aortic atherosclerosis. No enlarged abdominal or pelvic lymph nodes. Reproductive: Uterus and bilateral adnexa are unremarkable. Other: No abdominal wall hernia or abnormality. No abdominopelvic ascites. Musculoskeletal: No acute or significant osseous findings. IMPRESSION: Nonspecific perirenal fat stranding. 3.4 cm low-attenuation elongated structure abutting or arising from the second portion of the duodenum. This may represent a duodenal diverticulum, however it is suboptimally evaluated due to lack of oral contrast. Further evaluation with upper GI endoscopy may be considered if found clinically necessary. Otherwise, CT of the abdomen with IV and oral contrast in 3 months may be considered. Scattered colonic diverticulosis without evidence of diverticulitis. Electronically Signed   By: Ted Mcalpine M.D.   On: 02/05/2017 19:34   Dg Hips Bilat W Or Wo Pelvis 2 Views  Result Date: 02/05/2017 CLINICAL DATA:  Right hip pain, possible recent fall, initial encounter EXAM: DG HIP (WITH OR WITHOUT PELVIS) 4V BILAT COMPARISON:  None. FINDINGS: The pelvic ring is intact. No acute fracture or dislocation is seen. No gross soft tissue abnormality is noted. IMPRESSION: No acute fracture is noted. Electronically Signed   By: Alcide Clever M.D.   On: 02/05/2017 18:57    Procedures Procedures (including critical care time)  CRITICAL CARE Performed by: Thermon Leyland   Total critical care time: 35 minutes  Critical care time was exclusive of separately billable procedures and treating other patients.  Critical care was necessary to treat or  prevent imminent or life-threatening deterioration.  Critical care was time spent personally by me on the following activities: development of treatment plan with patient and/or surrogate as well as nursing, discussions with consultants, evaluation of patient's response to treatment, examination of patient, obtaining history from patient or surrogate, ordering and performing treatments and interventions, ordering and review of laboratory studies, ordering and review of radiographic studies, pulse oximetry and re-evaluation of patient's condition.  Medications Ordered in ED Medications  piperacillin-tazobactam (ZOSYN) IVPB 3.375 g (not administered)  vancomycin (VANCOCIN) 1,250 mg in sodium chloride 0.9 % 250 mL IVPB (not administered)  sodium chloride 0.9 % bolus 1,000 mL (0 mLs Intravenous Stopped 02/05/17 1655)  piperacillin-tazobactam (ZOSYN) IVPB 3.375 g (0 g Intravenous Stopped 02/05/17 1645)  vancomycin (VANCOCIN) 1,500 mg in sodium chloride 0.9 % 500 mL IVPB (  0 mg Intravenous Stopped 02/05/17 1815)  acetaminophen (TYLENOL) tablet 650 mg (650 mg Oral Given 02/05/17 1642)  iopamidol (ISOVUE-300) 61 % injection (100 mLs  Contrast Given 02/05/17 1900)     Initial Impression / Assessment and Plan / ED Course  I have reviewed the triage vital signs and the nursing notes.  Pertinent labs & imaging results that were available during my care of the patient were reviewed by me and considered in my medical decision making (see chart for details).     Final Clinical Impressions(s) / ED Diagnoses   Final diagnoses:  Sepsis, due to unspecified organism [facility])  Urinary tract infection with hematuria, site unspecified    Assessment/Plan: 66 year old female presents today with likely sepsis.  She does not have signs of severe sepsis upon initial evaluation, broad-spectrum antibiotics will be started, normal saline will be started, no weight-based indication at this time.  Patient's workup shows  likely urinary tract infection with questionable pyelonephritis with perinephric fat stranding.  Patient no longer having vomiting or diarrhea, lower suspicion for GI source.  Hospital service will be consulted for admission.   New Prescriptions New Prescriptions   No medications on file     Rosalio Loud 02/05/17 Lajuana Carry, MD 02/06/17 1146

## 2017-02-05 NOTE — ED Notes (Addendum)
IV line found to have come loose from catheter hub. Indeterminate amount of IVF leaked onto pt's bed.

## 2017-02-05 NOTE — Progress Notes (Signed)
Pharmacy Antibiotic Note Shahira Lingg. Fails is a 66 y.o. female admitted on 02/05/2017 with concern for UTI.  Pharmacy has been consulted for ceftriaxone dosing.  Plan: 1. Ceftriaxone 1 gram IV every 24 hours  2. Will follow peripherally   Weight: 175 lb (79.4 kg)  Temp (24hrs), Avg:100.5 F (38.1 C), Min:99 F (37.2 C), Max:101.9 F (38.8 C)   Recent Labs Lab 02/05/17 1556 02/05/17 1611 02/05/17 1800  WBC 13.9*  --   --   CREATININE 1.00  --   --   LATICACIDVEN  --  2.60* 1.44    CrCl cannot be calculated (Unknown ideal weight.).    Allergies  Allergen Reactions  . Macrobid [Nitrofurantoin Monohyd Macro] Swelling    Angioedema!!  . Benadryl [Diphenhydramine Hcl (Sleep)] Other (See Comments)    Bugs crawling sensation  . Codeine Nausea Only    Thank you for allowing pharmacy to be a part of this patient's care.  Pollyann Samples, PharmD, BCPS 02/05/2017, 8:45 PM

## 2017-02-05 NOTE — Progress Notes (Addendum)
Pharmacy Antibiotic Note  Tiffany [name] is a 66 y.o. female admitted on 02/05/2017 with sepsis.  Pharmacy has been consulted for Zosyn and vancomycin dosing.  No hx of CKD. Scr needs to be drawn. Tmax of 101.9.  Plan: Give Zosyn 3.375 gm IV x 1, follow up SCr Give vancomycin 1.5g IV x 1, then follow up SCr Monitor clinical picture, renal function, VT prn F/U C&S, abx deescalation / LOT  Weight: 175 lb (79.4 kg)  Temp (24hrs), Avg:100.5 F (38.1 C), Min:99 F (37.2 C), Max:101.9 F (38.8 C)  No results for input(s): WBC, CREATININE, LATICACIDVEN, VANCOTROUGH, VANCOPEAK, VANCORANDOM, GENTTROUGH, GENTPEAK, GENTRANDOM, TOBRATROUGH, TOBRAPEAK, TOBRARND, AMIKACINPEAK, AMIKACINTROU, AMIKACIN in the last 168 hours.  CrCl cannot be calculated (Patient's most recent lab result is older than the maximum 21 days allowed.).    Allergies  Allergen Reactions  . Macrobid [Nitrofurantoin Monohyd Macro] Swelling  . Benadryl [Diphenhydramine Hcl (Sleep)] Other (See Comments)    Bugs crawling sensation  . Codeine Nausea Only     Thank you for allowing pharmacy to be a part of this patient's care.  Enzo Bi, PharmD, BCPS Clinical Pharmacist Pager [phone] 02/05/2017 2:45 PM

## 2017-02-05 NOTE — Progress Notes (Signed)
Pharmacy Antibiotic Note Tiffany [name]. Tiffany [name] is a 66 y.o. female admitted on 02/05/2017 with sepsis.  Pharmacy has been consulted for Zosyn and vancomycin dosing.  Plan: 1. Zosyn 3.375 grams IV every 8 hours  2. Vancomycin 1250 mg IV every 24 hours starting on 8/19 at 16:00  Weight: 175 lb (79.4 kg)  Temp (24hrs), Avg:100.5 F (38.1 C), Min:99 F (37.2 C), Max:101.9 F (38.8 C)   Recent Labs Lab 02/05/17 1556 02/05/17 1611  WBC 13.9*  --   CREATININE 1.00  --   LATICACIDVEN  --  2.60*    CrCl cannot be calculated (Unknown ideal weight.).    Allergies  Allergen Reactions  . Macrobid [Nitrofurantoin Monohyd Macro] Swelling  . Benadryl [Diphenhydramine Hcl (Sleep)] Other (See Comments)    Bugs crawling sensation  . Codeine Nausea Only     Thank you for allowing pharmacy to be a part of this patient's care.  Pollyann Samples, PharmD, BCPS 02/05/2017, 5:56 PM

## 2017-02-05 NOTE — ED Notes (Signed)
Informed Katrinka Blazing, MD regarding pt's hypotension. Ordering IVF bolus and infusion.

## 2017-02-05 NOTE — ED Notes (Signed)
Oncoming nurse notified of patient being hard stick and need for blood cultures.

## 2017-02-05 NOTE — ED Triage Notes (Signed)
Pt presents to the ed with complaints of sore throat, nausea, vomiting and a fever x 2 days. The patient received phenergan at home and per family has been a little confused ever since.  She is alert to place, self and situation, disoriented to time.  She has baseline right sided deficit from a previous stroke.

## 2017-02-05 NOTE — H&P (Signed)
History and Physical    Tiffany [name]. [name] WUJ:[id number] DOB: August 17, 1950 DOA: 02/05/2017  Referring MD/NP/PA: Cresenciano Lick PA-C PCP: Erick Colace, MD  Patient coming from: Home via EMS  Chief Complaint: sore thorat  HPI: Tiffany [name]. Tiffany [name] is a 66 y.o. female with medical history significant of HTN, HLD, CAD, CVA with residual right-sided deficit, and anxiety; who presents with multiple complaints. Patient reports symptoms first started about 1 week ago with sore throat. Due to the sore throat and irritation she reports having a nonproductive cough. About 3 days ago she developed nausea, vomiting, and diarrhea symptoms. Reports having multiple episodes of nonbloody emesis. She tried keeping herself hydrated with Gatorade and took an old prescription of Phenergan with some mild relief. She reports having 5-6 liquid stools per day. Associated symptoms included generalized weakness, two falls (with trauma to her head, but no reported loss of consciousness), lower abdominal discomfort, mild confusion,  and urinary frequency( reporting using the bathroom anywhere from 7-8 times per day). Denies having any fever, dysuria, loss consciousness, chest pain, or shortness of breath. Lastly, patient makes no that her grandchildren and people that she is recently been around has been diagnosed with strep throat.    ED Course: Upon admission in the hospital patient was found to be febrile up to 101.79F, pulse 102-108, respirations 19- 28, and all other vital signs relatively within normal limits. Labs revealed WBC 13.9 initial lactic acid 2.6. Sepsis protocol was initially initiated with broad-spectrum antibiotics of vancomycin and Zosyn for unclear source of infection. Rapid strep was negative. Urinalysis was positive for many bacteria with positive nitrites. Chest x-ray showed no acute abnormalities. CT scan of the abdomen showed a suspected duodenum diverticulum without signs of diverticulitis as well as  perinephric stranding.    Review of Systems: Review of Systems  Constitutional: Positive for malaise/fatigue. Negative for chills.  HENT: Positive for sore throat. Negative for ear discharge.   Eyes: Negative for photophobia and pain.  Respiratory: Positive for cough. Negative for sputum production, shortness of breath and wheezing.   Cardiovascular: Negative for chest pain and orthopnea.  Gastrointestinal: Positive for abdominal pain, diarrhea, nausea and vomiting. Negative for blood in stool.  Genitourinary: Positive for frequency. Negative for dysuria.  Musculoskeletal: Positive for falls.  Skin: Negative for itching and rash.  Neurological: Positive for weakness. Negative for focal weakness, seizures and loss of consciousness.  Psychiatric/Behavioral: Negative for hallucinations. The patient is nervous/anxious.     Past Medical History:  Diagnosis Date  . Arthritis   . Coronary artery disease   . High cholesterol   . Hypertension   . MI (myocardial infarction) (HCC)   . Stroke (HCC)   . TIA (transient ischemic attack)     Past Surgical History:  Procedure Laterality Date  . CARDIAC SURGERY    . CORONARY ARTERY BYPASS GRAFT    . RADIOLOGY WITH ANESTHESIA N/A 11/09/2012   Procedure: RADIOLOGY WITH ANESTHESIA- CAROTID STENT;  Surgeon: Oneal Grout, MD;  Location: [facility] OR;  Service: Radiology;  Laterality: N/A;  . TEE WITHOUT CARDIOVERSION N/A 10/18/2012   Procedure: TRANSESOPHAGEAL ECHOCARDIOGRAM (TEE);  Surgeon: Vesta Mixer, MD;  Location: [facility] ENDOSCOPY;  Service: Cardiovascular;  Laterality: N/A;     reports that she quit smoking about 4 years ago. Her smoking use included Cigarettes. She has a 50.00 pack-year smoking history. She has never used smokeless tobacco. She reports that she does not drink alcohol or use drugs.  Allergies  Allergen Reactions  .  Macrobid [Nitrofurantoin Monohyd Macro] Swelling  . Benadryl [Diphenhydramine Hcl (Sleep)] Other (See Comments)      Bugs crawling sensation  . Codeine Nausea Only    Positive family history for heart disease  Prior to Admission medications   Medication Sig Start Date End Date Taking? Authorizing Provider  atorvastatin (LIPITOR) 80 MG tablet  11/06/15   [provider]  baclofen (LIORESAL) 10 MG tablet Take 1 tablet (10 mg total) by mouth 2 (two) times daily. 06/27/15   Kirsteins, Victorino Sparrow, MD  buPROPion (WELLBUTRIN XL) 300 MG 24 hr tablet  11/03/15   [provider]  citalopram (CELEXA) 40 MG tablet Take 40 mg by mouth daily.    [provider]  clonazePAM (KLONOPIN) 0.5 MG tablet Take 0.5 mg by mouth 2 (two) times daily as needed for anxiety.     [provider]  dipyridamole-aspirin (AGGRENOX) 200-25 MG 12hr capsule Take 1 capsule by mouth 2 (two) times daily.    [provider]  gabapentin (NEURONTIN) 300 MG capsule Take 1 capsule (300 mg total) by mouth 4 (four) times daily. 10/16/15   Kirsteins, Victorino Sparrow, MD  mometasone (NASONEX) 50 MCG/ACT nasal spray Place 2 sprays into the nose daily. 05/09/14   Ronal Fear, NP  nabumetone (RELAFEN) 750 MG tablet  10/20/15   [provider]    Physical Exam:  Constitutional: Elderly female who appears acutely sick Vitals:   02/05/17 1630 02/05/17 1645 02/05/17 1715 02/05/17 1745  BP: 106/69 117/69 (!) 107/50 99/64  Pulse: (!) 106 (!) 107 (!) 107 (!) 105  Resp: 20 (!) 27 (!) 21 (!) 23  Temp:      TempSrc:      SpO2: 94% 94% 94% 92%  Weight:       Eyes: PERRL, lids and conjunctivae normal ENMT: Mucous membranes are dry. Patient has upper dentures that have been removed. Posterior oropharynx erythematous. Cannot visualize tonsils or signs of exudate. Neck: Submandibular lymphadenopathy present . Neck otherwise supple, , no thyromegaly Respiratory: clear to auscultation bilaterally, no wheezing, no crackles. Normal respiratory effort. No accessory muscle use.  Cardiovascular: Regular rate and rhythm, no  murmurs / rubs / gallops. No extremity edema. 2+ pedal pulses. No carotid bruits.  Abdomen: no tenderness, no masses palpated. No hepatosplenomegaly. Bowel sounds positive.  Musculoskeletal: no clubbing / cyanosis. No joint deformity upper and lower extremities. Increased muscle tone noted on the right upper and lower extremities.  Skin: Contusion to the upper left crown of the head. Neurologic: CN 2-12 grossly intact. Sensation intact, DTR normal. Patient with baseline tremor and residual right-sided deficits unchanged. Psychiatric: Normal judgment and insight. Alert and oriented x with some wall confusion.    Labs on Admission: I have personally reviewed following labs and imaging studies  CBC:  Recent Labs Lab 02/05/17 1556  WBC 13.9*  NEUTROABS 11.8*  HGB 13.8  HCT 42.3  MCV 94.6  PLT 180   Basic Metabolic Panel:  Recent Labs Lab 02/05/17 1556  NA 135  K 3.6  CL 100*  CO2 23  GLUCOSE 103*  BUN 13  CREATININE 1.00  CALCIUM 8.1*   GFR: CrCl cannot be calculated (Unknown ideal weight.). Liver Function Tests:  Recent Labs Lab 02/05/17 1556  AST 22  ALT 12*  ALKPHOS 63  BILITOT 0.9  PROT 5.8*  ALBUMIN 3.1*   No results for input(s): LIPASE, AMYLASE in the last 168 hours. No results for input(s): AMMONIA in the last 168 hours. Coagulation  Profile: No results for input(s): INR, PROTIME in the last 168 hours. Cardiac Enzymes: No results for input(s): CKTOTAL, CKMB, CKMBINDEX, TROPONINI in the last 168 hours. BNP (last 3 results) No results for input(s): PROBNP in the last 8760 hours. HbA1C: No results for input(s): HGBA1C in the last 72 hours. CBG: No results for input(s): GLUCAP in the last 168 hours. Lipid Profile: No results for input(s): CHOL, HDL, LDLCALC, TRIG, CHOLHDL, LDLDIRECT in the last 72 hours. Thyroid Function Tests: No results for input(s): TSH, T4TOTAL, FREET4, T3FREE, THYROIDAB in the last 72 hours. Anemia Panel: No results for  input(s): VITAMINB12, FOLATE, FERRITIN, TIBC, IRON, RETICCTPCT in the last 72 hours. Urine analysis:    Component Value Date/Time   COLORURINE YELLOW 02/05/2017 1440   APPEARANCEUR HAZY (A) 02/05/2017 1440   LABSPEC 1.021 02/05/2017 1440   PHURINE 5.0 02/05/2017 1440   GLUCOSEU NEGATIVE 02/05/2017 1440   HGBUR NEGATIVE 02/05/2017 1440   BILIRUBINUR NEGATIVE 02/05/2017 1440   KETONESUR NEGATIVE 02/05/2017 1440   PROTEINUR NEGATIVE 02/05/2017 1440   UROBILINOGEN 0.2 11/03/2012 1600   NITRITE POSITIVE (A) 02/05/2017 1440   LEUKOCYTESUR NEGATIVE 02/05/2017 1440   Sepsis Labs: Recent Results (from the past 240 hour(s))  Rapid strep screen     Status: None   Collection Time: 02/05/17  2:38 PM  Result Value Ref Range Status   Streptococcus, Group A Screen (Direct) NEGATIVE NEGATIVE Final    Comment: (NOTE) A Rapid Antigen test may result negative if the antigen level in the sample is below the detection level of this test. The FDA has not cleared this test as a stand-alone test therefore the rapid antigen negative result has reflexed to a Group A Strep culture.      Radiological Exams on Admission: Dg Chest 2 View  Result Date: 02/05/2017 CLINICAL DATA:  Fever and sore throat for 2 days, initial encounter EXAM: CHEST  2 VIEW COMPARISON:  09/09/2016 FINDINGS: Cardiac shadow is within normal limits. The lungs are well aerated bilaterally. No focal infiltrate or sizable effusion is seen. No acute bony abnormality is noted. IMPRESSION: No active cardiopulmonary disease. Electronically Signed   By: Alcide Clever M.D.   On: 02/05/2017 18:52   Ct Abdomen Pelvis W Contrast  Result Date: 02/05/2017 CLINICAL DATA:  Nausea vomiting diarrhea and fever for 2 days. EXAM: CT ABDOMEN AND PELVIS WITH CONTRAST TECHNIQUE: Multidetector CT imaging of the abdomen and pelvis was performed using the standard protocol following bolus administration of intravenous contrast. CONTRAST:  ISOVUE-300 IOPAMIDOL  (ISOVUE-300) INJECTION 61% COMPARISON:  None. FINDINGS: Lower chest: No acute abnormality. Hepatobiliary: No focal liver abnormality is seen. No gallstones, gallbladder wall thickening, or biliary dilatation. Pancreas: Unremarkable. No pancreatic ductal dilatation or surrounding inflammatory changes. Spleen: Normal in size without focal abnormality. Adrenals/Urinary Tract: Adrenal glands are unremarkable. Kidneys are without renal calculi, focal lesion, or hydronephrosis. Bilateral perirenal fat stranding, nonspecific. Bladder is unremarkable. Stomach/Bowel: Stomach is within normal limits. Tiny hiatal hernia. Appendix appears normal. No evidence of bowel wall thickening, distention, or inflammatory changes. 3.4 cm low-attenuation elongated structure abuts or arises from the second portion of the duodenum, axial image 33/91, coronal image 57/151. Scattered left colonic diverticulosis without evidence of diverticulitis. Vascular/Lymphatic: Aortic atherosclerosis. No enlarged abdominal or pelvic lymph nodes. Reproductive: Uterus and bilateral adnexa are unremarkable. Other: No abdominal wall hernia or abnormality. No abdominopelvic ascites. Musculoskeletal: No acute or significant osseous findings. IMPRESSION: Nonspecific perirenal fat stranding. 3.4 cm low-attenuation elongated structure abutting or arising from the  second portion of the duodenum. This may represent a duodenal diverticulum, however it is suboptimally evaluated due to lack of oral contrast. Further evaluation with upper GI endoscopy may be considered if found clinically necessary. Otherwise, CT of the abdomen with IV and oral contrast in 3 months may be considered. Scattered colonic diverticulosis without evidence of diverticulitis. Electronically Signed   By: Ted Mcalpine M.D.   On: 02/05/2017 19:34   Dg Hips Bilat W Or Wo Pelvis 2 Views  Result Date: 02/05/2017 CLINICAL DATA:  Right hip pain, possible recent fall, initial encounter EXAM:  DG HIP (WITH OR WITHOUT PELVIS) 4V BILAT COMPARISON:  None. FINDINGS: The pelvic ring is intact. No acute fracture or dislocation is seen. No gross soft tissue abnormality is noted. IMPRESSION: No acute fracture is noted. Electronically Signed   By: Alcide Clever M.D.   On: 02/05/2017 18:57    EKG: Independently reviewed. Sinus tachycardia with prolonged QTC of 504  Assessment/Plan Sepsis secondary to urinary tract infection/pyelonephritis: Acute. Patient presents with fever up to 101.25F with tachycardia and tachypnea. Labs reveal WBC 13.9 with elevated lactic acid of 2.6 on admission with positive UA. Abd CT with presence of some perinephric stranding Patient was initially given empiric antibiotics of vancomycin and Zosyn. - Admit to a telemetry bed - Follow-up blood and urine cultures. - Check resp virus panel, strep and Legionella urine culture - De-escalated antibiotics to Rocephin, but may need to broaden antibiotics if other cause of symptoms found.  - Tylenol prn fever  Falls at home with head contusion, mild confusion, generalized weakness: Patient reports having 2 falls homes with trauma to the head, but no reported loss consciousness. Patient somewhat confused on physical exam and on Aggrenox at home. - Neuro checks - Check CT brain without contrast - Physical therapy to eval and treat  Sore throat: Initial rapid strep negative. Posterior oropharynx is poorly seen on physical exam, but erythema is present  Nausea, vomiting, and diarrhea: Acute. Patient reports symptoms improving and denies any recent antibiotic use. Could be secondary to a viral illness. - Symptomatic treatment  - IV fluids normal saline at 100 ml/hr from and antiemetics as needed  Sore throat/ upper respiratory infection: Acute. Patient reports being around grandchildren with strep throat.  Although she had a negative rapid strep she still may have strep throat vs. some other viral illness. - Follow-up studies  above - Throat lozenges as needed - Patient should be covered with antibiotics provided above   Prolonged QTC: Initially seen on admission of 504. - Recheck EKG in a.m.  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 Patient was noted to have an acute infarct of the left MCA territory which was noted to be caused by right internal carotid artery stenosis for which she underwent stenting by interventional radiology. 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 - Recommend outpatient EGD for further investigation with gastroenterology  Diastolic dysfunction: Patient's last EF was noted to be 60-65% with grade 1 diastolic dysfunction last echo being in 2014. - Monitor ins and outs   Anxiety/depression - Continue Celexa, Wellbutrin, and Klonopin prn anxiety  CAD - Continue Aggrenox  Hyperlipidemia - Continue atorvastatin  DVT prophylaxis: lovenox, if CT of brain negative for acute bleed.   Code Status: Full Family Communication: No family present at bedside  Disposition Plan:likely discharge home in 2-3 days Consults called: None  Admission status: Inpatient   Clydie Braun MD [facility] Pager [phone]   If 7PM-7AM, please contact night-coverage [email or web address] Password [facility]  02/05/2017, 7:57 PM

## 2017-02-05 NOTE — ED Notes (Signed)
Patient transported to CT 

## 2017-02-05 NOTE — ED Notes (Signed)
Unable to get labs, phlebotomy notified and coming to attempt

## 2017-02-05 NOTE — ED Notes (Signed)
Nurse currently drawing labs 

## 2017-02-06 DIAGNOSIS — N390 Urinary tract infection, site not specified: Secondary | ICD-10-CM

## 2017-02-06 DIAGNOSIS — I6930 Unspecified sequelae of cerebral infarction: Secondary | ICD-10-CM | POA: Diagnosis not present

## 2017-02-06 DIAGNOSIS — R112 Nausea with vomiting, unspecified: Secondary | ICD-10-CM | POA: Diagnosis present

## 2017-02-06 DIAGNOSIS — F329 Major depressive disorder, single episode, unspecified: Secondary | ICD-10-CM | POA: Diagnosis not present

## 2017-02-06 DIAGNOSIS — G8110 Spastic hemiplegia affecting unspecified side: Secondary | ICD-10-CM | POA: Diagnosis not present

## 2017-02-06 DIAGNOSIS — F419 Anxiety disorder, unspecified: Secondary | ICD-10-CM | POA: Diagnosis not present

## 2017-02-06 DIAGNOSIS — I69351 Hemiplegia and hemiparesis following cerebral infarction affecting right dominant side: Secondary | ICD-10-CM | POA: Diagnosis not present

## 2017-02-06 DIAGNOSIS — A419 Sepsis, unspecified organism: Secondary | ICD-10-CM | POA: Diagnosis not present

## 2017-02-06 DIAGNOSIS — R319 Hematuria, unspecified: Secondary | ICD-10-CM

## 2017-02-06 DIAGNOSIS — I5032 Chronic diastolic (congestive) heart failure: Secondary | ICD-10-CM | POA: Diagnosis not present

## 2017-02-06 DIAGNOSIS — R197 Diarrhea, unspecified: Secondary | ICD-10-CM

## 2017-02-06 DIAGNOSIS — F32A Depression, unspecified: Secondary | ICD-10-CM | POA: Diagnosis present

## 2017-02-06 LAB — BASIC METABOLIC PANEL
Anion gap: 9 (ref 5–15)
BUN: 12 mg/dL (ref 6–20)
CHLORIDE: 106 mmol/L (ref 101–111)
CO2: 23 mmol/L (ref 22–32)
Calcium: 7.5 mg/dL — ABNORMAL LOW (ref 8.9–10.3)
Creatinine, Ser: 0.85 mg/dL (ref 0.44–1.00)
GFR calc non Af Amer: >60 mL/min (ref >60)
Glucose, Bld: 89 mg/dL (ref 65–99)
Potassium: 2.9 mmol/L — ABNORMAL LOW (ref 3.5–5.1)
SODIUM: 138 mmol/L (ref 135–145)

## 2017-02-06 LAB — RESPIRATORY PANEL BY PCR
ADENOVIRUS-RVPPCR: NOT DETECTED
BORDETELLA PERTUSSIS-RVPCR: NOT DETECTED
CHLAMYDOPHILA PNEUMONIAE-RVPPCR: NOT DETECTED
CORONAVIRUS 229E-RVPPCR: NOT DETECTED
Coronavirus HKU1: NOT DETECTED
Coronavirus NL63: NOT DETECTED
Coronavirus OC43: NOT DETECTED
INFLUENZA A H1-RVPPCR: NOT DETECTED
INFLUENZA A-RVPPCR: NOT DETECTED
Influenza A H1 2009: NOT DETECTED
Influenza A H3: NOT DETECTED
Influenza B: NOT DETECTED
Metapneumovirus: NOT DETECTED
Mycoplasma pneumoniae: NOT DETECTED
PARAINFLUENZA VIRUS 4-RVPPCR: NOT DETECTED
Parainfluenza Virus 1: NOT DETECTED
Parainfluenza Virus 2: NOT DETECTED
Parainfluenza Virus 3: NOT DETECTED
RESPIRATORY SYNCYTIAL VIRUS-RVPPCR: NOT DETECTED
Rhinovirus / Enterovirus: NOT DETECTED

## 2017-02-06 LAB — GASTROINTESTINAL PANEL BY PCR, STOOL (REPLACES STOOL CULTURE)

## 2017-02-06 LAB — CBC
HEMATOCRIT: 36.1 % (ref 36.0–46.0)
HEMOGLOBIN: 11.8 g/dL — AB (ref 12.0–15.0)
MCH: 30.3 pg (ref 26.0–34.0)
MCHC: 32.7 g/dL (ref 30.0–36.0)
MCV: 92.8 fL (ref 78.0–100.0)
Platelets: 170 10*3/uL (ref 150–400)
RBC: 3.89 MIL/uL (ref 3.87–5.11)
RDW: 13.9 % (ref 11.5–15.5)
WBC: 13.9 10*3/uL — ABNORMAL HIGH (ref 4.0–10.5)

## 2017-02-06 LAB — LACTIC ACID, PLASMA: Lactic Acid, Venous: 1.2 mmol/L (ref 0.5–1.9)

## 2017-02-06 MED ORDER — POTASSIUM CHLORIDE 10 MEQ/100ML IV SOLN
10.0000 meq | INTRAVENOUS | Status: AC
  Administered 2017-02-06 (×3): 10 meq via INTRAVENOUS
  Filled 2017-02-06 (×3): qty 100

## 2017-02-06 MED ORDER — MAGIC MOUTHWASH W/LIDOCAINE
5.0000 mL | Freq: Three times a day (TID) | ORAL | Status: DC | PRN
Start: 2017-02-06 — End: 2017-02-07

## 2017-02-06 MED ORDER — POTASSIUM CHLORIDE CRYS ER 20 MEQ PO TBCR
40.0000 meq | EXTENDED_RELEASE_TABLET | Freq: Two times a day (BID) | ORAL | Status: DC
  Administered 2017-02-06 – 2017-02-07 (×2): 40 meq via ORAL
  Filled 2017-02-06 (×2): qty 2

## 2017-02-06 MED ORDER — MENTHOL 3 MG MT LOZG
1.0000 | LOZENGE | Freq: Three times a day (TID) | OROMUCOSAL | Status: DC
  Administered 2017-02-06 – 2017-02-07 (×5): 3 mg via ORAL
  Filled 2017-02-06 (×4): qty 9

## 2017-02-06 NOTE — Progress Notes (Signed)
Patient received from ED. Patient is alert oriented . Vital signs are temp 99.6 blood pressure 101/49 and pulse 113.patient getting normal saline 100 /hour.patient is on telemetry.patient given instruction about call bell and phone. Bed in low position and side rail up x2.

## 2017-02-06 NOTE — Evaluation (Signed)
Physical Therapy Evaluation Patient Details Name: Tiffany [name]. Patalano MRN: [id number] DOB: 1950/07/02 Today's Date: 02/06/2017   History of Present Illness    66 y.o. female with medical history significant of HTN, HLD, CAD, CVA with residual right-sided deficit, and anxiety; presented 8/18 with general malaise/constitutional sign/symptoms; admit for sepsis secondary to UTI. Fell x2 at home pta with one strike to head (no LOC).   Clinical Impression  Pt presents with moderate functional limitations beyond premorbid residual weakness requiring up to moderate assist for basic mobility tasks and muscular fatigue/SOB after very short duration activity.  Was fairly independent prior to admit, just moved to new home, lives with spouse and has strong support system.  Recommend HHPT for f/u at dc with eventual transition to OPPT if necessary for continued post-stroke rehab; PT will see acutely to improve function.  See notes below for details of exam.    Follow Up Recommendations Home health PT;Supervision/Assistance - 24 hour    Equipment Recommendations  None recommended by PT    Recommendations for Other Services       Precautions / Restrictions Precautions Precautions: Fall Precaution Comments: alarm, up with assit, use RW, residual R hemiplegia      Mobility  Bed Mobility Overal bed mobility: Needs Assistance Bed Mobility: Rolling;Sidelying to Sit Rolling: Modified independent (Device/Increase time) Sidelying to sit: Min assist       General bed mobility comments: exited Right side of bed: needs help lifting trunk into upright and scooting right hip to EOB. Try exit left side next time  Transfers Overall transfer level: Needs assistance Equipment used: Rolling walker (2 wheeled) Transfers: Sit to/from Stand Sit to Stand: Mod assist         General transfer comment: first attempt unsteady and needs incr assist to stand, second with more control but needs cues for best hand  placement and a second to steady on standing.  Cues for alignment and armrests to sit, uncontrolled due to quad weakness (has lift chair at home)  Ambulation/Gait Ambulation/Gait assistance: Min assist Ambulation Distance (Feet): 15 Feet Assistive device: Rolling walker (2 wheeled) Gait Pattern/deviations: Step-through pattern (mild hemiparetic gait)        Stairs            Wheelchair Mobility    Modified Rankin (Stroke Patients Only)       Balance Overall balance assessment: Needs assistance;History of Falls Sitting-balance support: No upper extremity supported;Feet supported Sitting balance-Leahy Scale: Fair     Standing balance support: Bilateral upper extremity supported;During functional activity Standing balance-Leahy Scale: Poor Standing balance comment: dependent on RW for support in standing, unable to let go and remain stable                             Pertinent Vitals/Pain Pain Assessment: No/denies pain    Home Living Family/patient expects to be discharged to:: Private residence Living Arrangements: Spouse/significant other Available Help at Discharge: Family;Available 24 hours/day Type of Home: House Home Access: Level entry     Home Layout: One level Home Equipment: Walker - 2 wheels;Shower seat - built in Additional Comments: just moved to new home    Prior Function Level of Independence: Needs assistance   Gait / Transfers Assistance Needed: independent with device  ADL's / Homemaking Assistance Needed: doesn't cook or clean or drive        Hand Dominance   Dominant Hand: Right    Extremity/Trunk Assessment  Upper Extremity Assessment Upper Extremity Assessment: RUE deficits/detail RUE Deficits / Details: residual hemiplegia    Lower Extremity Assessment Lower Extremity Assessment: Generalized weakness;RLE deficits/detail RLE Deficits / Details: residual hemiplegia    Cervical / Trunk Assessment Cervical /  Trunk Assessment: Normal  Communication   Communication: No difficulties  Cognition Arousal/Alertness: Awake/alert Behavior During Therapy: WFL for tasks assessed/performed Overall Cognitive Status: Within Functional Limits for tasks assessed                                 General Comments: able to describe medical history, problem solve with PT      General Comments General comments (skin integrity, edema, etc.): soiled on arrival, spent several minutes with RN performing extensive pericare and removing/replacing purwick device before/after session     Exercises     Assessment/Plan    PT Assessment Patient needs continued PT services  PT Problem List Decreased skin integrity;Cardiopulmonary status limiting activity;Decreased strength;Decreased range of motion;Decreased activity tolerance;Decreased balance;Decreased mobility       PT Treatment Interventions Patient/family education;DME instruction;Gait training;Functional mobility training;Therapeutic activities;Therapeutic exercise;Balance training    PT Goals (Current goals can be found in the Care Plan section)  Acute Rehab PT Goals Patient Stated Goal: go home, stop falling, feel stronger PT Goal Formulation: With patient Time For Goal Achievement: 02/20/17 Potential to Achieve Goals: Good    Frequency Min 3X/week   Barriers to discharge        Co-evaluation               AM-PAC PT "6 Clicks" Daily Activity  Outcome Measure Difficulty turning over in bed (including adjusting bedclothes, sheets and blankets)?: A Little Difficulty moving from lying on back to sitting on the side of the bed? : A Lot Difficulty sitting down on and standing up from a chair with arms (e.g., wheelchair, bedside commode, etc,.)?: A Lot Help needed moving to and from a bed to chair (including a wheelchair)?: A Little Help needed walking in hospital room?: A Little Help needed climbing 3-5 steps with a railing? : A  Lot 6 Click Score: 15    End of Session Equipment Utilized During Treatment: Gait belt Activity Tolerance: Patient tolerated treatment well;Patient limited by fatigue Patient left: in chair;with call bell/phone within reach;with family/visitor present Nurse Communication: Mobility status PT Visit Diagnosis: Hemiplegia and hemiparesis;Other abnormalities of gait and mobility (R26.89);Repeated falls (R29.6) Hemiplegia - Right/Left: Right Hemiplegia - dominant/non-dominant: Dominant Hemiplegia - caused by: Cerebral infarction (premorbid (2-3 yrs ago))    Time: 1410-1500 PT Time Calculation (min) (ACUTE ONLY): 50 min   Charges:   PT Evaluation $PT Eval High Complexity: 1 High PT Treatments $Gait Training: 8-22 mins $Therapeutic Activity: 23-37 mins   PT G Codes:        Narda Amber, PT, MS, DPT Board Certified Geriatric Clinical Specialist  Dennis Bast 02/06/2017, 3:57 PM

## 2017-02-06 NOTE — Progress Notes (Signed)
PROGRESS NOTE    Tiffany [name]. [name]  KZL:[id number] DOB: June 20, 1951 DOA: 02/05/2017 PCP: Erick Colace, MD   Brief Narrative: 66 y.o. female with medical history significant of HTN, HLD, CAD, CVA with residual right-sided deficit, and anxiety presented with shortness throat, nonproductive cough, nausea vomiting diarrhea and generalized weakness for about a week. Patient also with increased urinary frequency. In the ER patient was found to be febrile with temperature up to 101.9 tachycardic. Repeat a strep negative. UA with UTI. Admitted for further evaluation.  Assessment & Plan:  # Sepsis secondary to urinary tract infection/acute pyelonephritis:  -Patient with fever, nausea vomiting tachycardia, leukocytosis, increased urinary frequency. UA positive. CT scan with perinephric stranding. -Continue ceftriaxone, follow up culture results, ordered urine culture -Follow-up respiratory viral panel, is strep, urine Legionella  #Fall at home with mild confusion and generalized weakness, known history of left MCA stroke: Denied loss of consciousness. CT scan of brain without contrast with left MCA territory infarction likely chronic. I discussed above finding with Dr.Aroor from neurology. He recommended carotid Doppler ultrasound. -Continue Lipitor, Aggrenox. -PT OT evaluation.  #Sore throat: Initial rapid strep negative. Ordered Magic mouthwash, several cold. Patient also reported nausea and difficulty swallowing. Check esophageal x-ray. Continue to monitor. Follow-up respiratory viral yesterday.  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 Continue current management. Follow-up Doppler ultrasound.  #Hypokalemia: Replete both IV and oral potassium chloride. Check magnesium level. Monitor labs.  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 Recommended outpatient follow-up with GI.  #Chronic diastolic  congestive heart failure: Clinically stable.  #Anxiety depression: Continue home medication.  #Hyperlipidemia continue Lipitor.  DVT prophylaxis: Lovenox subcutaneous Code Status: Full code Family Communication: No family at bedside Disposition Plan: Likely discharge home in 1-2 days. PT OT evaluation and case manager referral    Consultants:   None  Procedures: None Antimicrobials: Ceftriaxone since August 19. Received vancomycin and Zosyn on August 18.  Subjective: Seen and examined at bedside. Reported mild shortness throat and nausea associated with difficulty swallowing. Denied headache, dizziness, chest pain or shortness of breath.  Objective: Vitals:   02/06/17 0227 02/06/17 0334 02/06/17 0603 02/06/17 1339  BP: (!) 91/44 (!) 103/55 (!) 106/54 102/64  Pulse: (!) 108 (!) 102 (!) 101 93  Resp:   18 16  Temp:  99.1 F (37.3 C) 98.9 F (37.2 C)   TempSrc:  Oral Oral   SpO2:   94% 96%  Weight:        Intake/Output Summary (Last 24 hours) at 02/06/17 1622 Last data filed at 02/06/17 1144  Gross per 24 hour  Intake             3550 ml  Output              700 ml  Net             2850 ml   Filed Weights   02/05/17 1417  Weight: 79.4 kg (175 lb)    Examination:  General exam: Appears calm and comfortable  Respiratory system: Clear to auscultation. Respiratory effort normal. No wheezing or crackle Cardiovascular system: S1 & S2 heard, RRR.  No pedal edema. Gastrointestinal system: Abdomen is nondistended, soft and nontender. Normal bowel sounds heard. Central nervous system: Alert Awake and following commands Extremities: Left-sided weakness Skin: No rashes, lesions or ulcers Psychiatry: Judgement and insight appear normal. Mood & affect appropriate.     Data Reviewed: I have personally reviewed following  labs and imaging studies  CBC:  Recent Labs Lab 02/05/17 1556 02/06/17 0532  WBC 13.9* 13.9*  NEUTROABS 11.8*  --   HGB 13.8 11.8*  HCT 42.3  36.1  MCV 94.6 92.8  PLT 180 170   Basic Metabolic Panel:  Recent Labs Lab 02/05/17 1556 02/06/17 0532  NA 135 138  K 3.6 2.9*  CL 100* 106  CO2 23 23  GLUCOSE 103* 89  BUN 13 12  CREATININE 1.00 0.85  CALCIUM 8.1* 7.5*   GFR: CrCl cannot be calculated (Unknown ideal weight.). Liver Function Tests:  Recent Labs Lab 02/05/17 1556  AST 22  ALT 12*  ALKPHOS 63  BILITOT 0.9  PROT 5.8*  ALBUMIN 3.1*   No results for input(s): LIPASE, AMYLASE in the last 168 hours. No results for input(s): AMMONIA in the last 168 hours. Coagulation Profile: No results for input(s): INR, PROTIME in the last 168 hours. Cardiac Enzymes: No results for input(s): CKTOTAL, CKMB, CKMBINDEX, TROPONINI in the last 168 hours. BNP (last 3 results) No results for input(s): PROBNP in the last 8760 hours. HbA1C: No results for input(s): HGBA1C in the last 72 hours. CBG: No results for input(s): GLUCAP in the last 168 hours. Lipid Profile: No results for input(s): CHOL, HDL, LDLCALC, TRIG, CHOLHDL, LDLDIRECT in the last 72 hours. Thyroid Function Tests: No results for input(s): TSH, T4TOTAL, FREET4, T3FREE, THYROIDAB in the last 72 hours. Anemia Panel: No results for input(s): VITAMINB12, FOLATE, FERRITIN, TIBC, IRON, RETICCTPCT in the last 72 hours. Sepsis Labs:  Recent Labs Lab 02/05/17 1611 02/05/17 1800 02/05/17 2243 02/06/17 0034  PROCALCITON  --   --  0.61  --   LATICACIDVEN 2.60* 1.44 1.6 1.2    Recent Results (from the past 240 hour(s))  Rapid strep screen     Status: None   Collection Time: 02/05/17  2:38 PM  Result Value Ref Range Status   Streptococcus, Group A Screen (Direct) NEGATIVE NEGATIVE Final    Comment: (NOTE) A Rapid Antigen test may result negative if the antigen level in the sample is below the detection level of this test. The FDA has not cleared this test as a stand-alone test therefore the rapid antigen negative result has reflexed to a Group A Strep  culture.   Culture, group A strep     Status: None (Preliminary result)   Collection Time: 02/05/17  2:38 PM  Result Value Ref Range Status   Specimen Description THROAT  Final   Special Requests NONE Reflexed from [id number]  Final   Culture CULTURE REINCUBATED FOR BETTER GROWTH  Final   Report Status PENDING  Incomplete  Blood Culture (routine x 2)     Status: None (Preliminary result)   Collection Time: 02/05/17  3:50 PM  Result Value Ref Range Status   Specimen Description BLOOD RIGHT ANTECUBITAL  Final   Special Requests IN PEDIATRIC BOTTLE Blood Culture adequate volume  Final   Culture NO GROWTH < 24 HOURS  Final   Report Status PENDING  Incomplete  Blood Culture (routine x 2)     Status: None (Preliminary result)   Collection Time: 02/05/17  4:00 PM  Result Value Ref Range Status   Specimen Description BLOOD RIGHT HAND  Final   Special Requests IN PEDIATRIC BOTTLE Blood Culture adequate volume  Final   Culture NO GROWTH < 24 HOURS  Final   Report Status PENDING  Incomplete         Radiology Studies: Dg Chest 2 View  Result Date: 02/05/2017 CLINICAL DATA:  Fever and sore throat for 2 days, initial encounter EXAM: CHEST  2 VIEW COMPARISON:  09/09/2016 FINDINGS: Cardiac shadow is within normal limits. The lungs are well aerated bilaterally. No focal infiltrate or sizable effusion is seen. No acute bony abnormality is noted. IMPRESSION: No active cardiopulmonary disease. Electronically Signed   By: Alcide Clever M.D.   On: 02/05/2017 18:52   Ct Head Wo Contrast  Addendum Date: 02/05/2017   ADDENDUM REPORT: 02/05/2017 22:44 ADDENDUM: These results were called by telephone at the time of interpretation on 02/05/2017 at 10:44 pm to Dr. Madelyn Flavors , who verbally acknowledged these results. Electronically Signed   By: Ted Mcalpine M.D.   On: 02/05/2017 22:44   Result Date: 02/05/2017 CLINICAL DATA:  Unexplained altered level of consciousness. EXAM: CT HEAD WITHOUT CONTRAST  TECHNIQUE: Contiguous axial images were obtained from the base of the skull through the vertex without intravenous contrast. COMPARISON:  Head CT 10/17/2012, brain MRI 10/17/2012 FINDINGS: Brain: No evidence of acute hemorrhage, hydrocephalus, extra-axial collection or mass lesion/mass effect. Hypoattenuated defect in the left MCA territory appears to involve larger area of the parietal cortex when compared to prior MRI. Periventricular microangiopathy and moderate brain parenchymal volume loss. Vascular: Calcific atherosclerotic disease at the skullbase. Skull: Normal. Negative for fracture or focal lesion. Sinuses/Orbits: No acute finding. Other: None. IMPRESSION: Hypoattenuated defect in the left MCA territory appears to involve larger area of the parietal cortex when compared to the prior MRI from 2014. Therefore, reinfarction in the same vascular category cannot be excluded. Periventricular microangiopathy and moderate brain parenchymal volume loss. Electronically Signed: By: Ted Mcalpine M.D. On: 02/05/2017 22:38   Ct Abdomen Pelvis W Contrast  Result Date: 02/05/2017 CLINICAL DATA:  Nausea vomiting diarrhea and fever for 2 days. EXAM: CT ABDOMEN AND PELVIS WITH CONTRAST TECHNIQUE: Multidetector CT imaging of the abdomen and pelvis was performed using the standard protocol following bolus administration of intravenous contrast. CONTRAST:  ISOVUE-300 IOPAMIDOL (ISOVUE-300) INJECTION 61% COMPARISON:  None. FINDINGS: Lower chest: No acute abnormality. Hepatobiliary: No focal liver abnormality is seen. No gallstones, gallbladder wall thickening, or biliary dilatation. Pancreas: Unremarkable. No pancreatic ductal dilatation or surrounding inflammatory changes. Spleen: Normal in size without focal abnormality. Adrenals/Urinary Tract: Adrenal glands are unremarkable. Kidneys are without renal calculi, focal lesion, or hydronephrosis. Bilateral perirenal fat stranding, nonspecific. Bladder is  unremarkable. Stomach/Bowel: Stomach is within normal limits. Tiny hiatal hernia. Appendix appears normal. No evidence of bowel wall thickening, distention, or inflammatory changes. 3.4 cm low-attenuation elongated structure abuts or arises from the second portion of the duodenum, axial image 33/91, coronal image 57/151. Scattered left colonic diverticulosis without evidence of diverticulitis. Vascular/Lymphatic: Aortic atherosclerosis. No enlarged abdominal or pelvic lymph nodes. Reproductive: Uterus and bilateral adnexa are unremarkable. Other: No abdominal wall hernia or abnormality. No abdominopelvic ascites. Musculoskeletal: No acute or significant osseous findings. IMPRESSION: Nonspecific perirenal fat stranding. 3.4 cm low-attenuation elongated structure abutting or arising from the second portion of the duodenum. This may represent a duodenal diverticulum, however it is suboptimally evaluated due to lack of oral contrast. Further evaluation with upper GI endoscopy may be considered if found clinically necessary. Otherwise, CT of the abdomen with IV and oral contrast in 3 months may be considered. Scattered colonic diverticulosis without evidence of diverticulitis. Electronically Signed   By: Ted Mcalpine M.D.   On: 02/05/2017 19:34   Dg Hips Bilat W Or Wo Pelvis 2 Views  Result Date: 02/05/2017 CLINICAL DATA:  Right hip pain, possible recent fall, initial encounter EXAM: DG HIP (WITH OR WITHOUT PELVIS) 4V BILAT COMPARISON:  None. FINDINGS: The pelvic ring is intact. No acute fracture or dislocation is seen. No gross soft tissue abnormality is noted. IMPRESSION: No acute fracture is noted. Electronically Signed   By: Alcide Clever M.D.   On: 02/05/2017 18:57        Scheduled Meds: . atorvastatin  80 mg Oral QHS  . baclofen  10 mg Oral BID  . buPROPion  300 mg Oral QHS  . citalopram  40 mg Oral q morning - 10a  . dipyridamole-aspirin  1 capsule Oral BID  . enoxaparin (LOVENOX) injection   40 mg Subcutaneous Q24H  . gabapentin  300 mg Oral TID  . menthol-cetylpyridinium  1 lozenge Oral TID  . nabumetone  750 mg Oral Daily  . potassium chloride  40 mEq Oral BID   Continuous Infusions: . cefTRIAXone (ROCEPHIN)  IV Stopped (02/06/17 0549)  . potassium chloride       LOS: 1 day    Deysy Schabel Jaynie Collins, MD [facility] Pager [phone]  If 7PM-7AM, please contact night-coverage [email or web address] Password [facility] 02/06/2017, 4:22 PM

## 2017-02-07 ENCOUNTER — Inpatient Hospital Stay (HOSPITAL_COMMUNITY): Payer: Medicare Other

## 2017-02-07 DIAGNOSIS — N10 Acute tubulo-interstitial nephritis: Secondary | ICD-10-CM

## 2017-02-07 DIAGNOSIS — F419 Anxiety disorder, unspecified: Secondary | ICD-10-CM

## 2017-02-07 DIAGNOSIS — G8110 Spastic hemiplegia affecting unspecified side: Secondary | ICD-10-CM

## 2017-02-07 DIAGNOSIS — A419 Sepsis, unspecified organism: Secondary | ICD-10-CM | POA: Diagnosis not present

## 2017-02-07 LAB — CBC
HCT: 35.2 % — ABNORMAL LOW (ref 36.0–46.0)
Hemoglobin: 11.7 g/dL — ABNORMAL LOW (ref 12.0–15.0)
MCH: 31 pg (ref 26.0–34.0)
MCHC: 33.2 g/dL (ref 30.0–36.0)
MCV: 93.1 fL (ref 78.0–100.0)
PLATELETS: 192 10*3/uL (ref 150–400)
RBC: 3.78 MIL/uL — AB (ref 3.87–5.11)
RDW: 14.2 % (ref 11.5–15.5)
WBC: 10.9 10*3/uL — ABNORMAL HIGH (ref 4.0–10.5)

## 2017-02-07 LAB — BASIC METABOLIC PANEL
Anion gap: 6 (ref 5–15)
BUN: 9 mg/dL (ref 6–20)
CO2: 26 mmol/L (ref 22–32)
CREATININE: 0.76 mg/dL (ref 0.44–1.00)
Calcium: 7.7 mg/dL — ABNORMAL LOW (ref 8.9–10.3)
Chloride: 110 mmol/L (ref 101–111)
GFR calc Af Amer: >60 mL/min (ref >60)
GLUCOSE: 83 mg/dL (ref 65–99)
POTASSIUM: 3.4 mmol/L — AB (ref 3.5–5.1)
Sodium: 142 mmol/L (ref 135–145)

## 2017-02-07 LAB — VAS US CAROTID
LCCADDIAS: 10 cm/s
LCCADSYS: 53 cm/s
LCCAPDIAS: 13 cm/s
LCCAPSYS: 74 cm/s
LEFT ECA DIAS: -25 cm/s
LEFT VERTEBRAL DIAS: 18 cm/s
Left ICA prox sys: 110 cm/s
RCCADSYS: -237 cm/s
RIGHT ECA DIAS: -19 cm/s
RIGHT VERTEBRAL DIAS: 15 cm/s
Right CCA prox dias: 14 cm/s
Right CCA prox sys: 62 cm/s

## 2017-02-07 LAB — LEGIONELLA PNEUMOPHILA SEROGP 1 UR AG: L. PNEUMOPHILA SEROGP 1 UR AG: NEGATIVE

## 2017-02-07 LAB — CULTURE, GROUP A STREP (THRC)

## 2017-02-07 MED ORDER — POTASSIUM CHLORIDE CRYS ER 20 MEQ PO TBCR
20.0000 meq | EXTENDED_RELEASE_TABLET | Freq: Two times a day (BID) | ORAL | 0 refills | Status: DC
Start: 2017-02-07 — End: 2017-12-01

## 2017-02-07 MED ORDER — CEPHALEXIN 500 MG PO CAPS: 500.0000 mg | ORAL_CAPSULE | Freq: Two times a day (BID) | ORAL | 0 refills | Status: AC

## 2017-02-07 NOTE — Progress Notes (Signed)
*  PRELIMINARY RESULTS* Vascular Ultrasound Carotid Duplex (Doppler) has been completed.  Preliminary findings: Right ICA stent 50-75% stenosis. Left ICA known occlusion. Antegrade vertebral flow.    Farrel Demark, RDMS, RVT  02/07/2017, 10:41 AM

## 2017-02-07 NOTE — Progress Notes (Signed)
Patient discharged to home via sister's car around 1614, after being set up with home health. Patient stable, IV removed, and skin intact with no signs of breakdown. Discharge teaching provided to patient who verbalized understanding.  Sherlon Handing, RN

## 2017-02-07 NOTE — Discharge Summary (Signed)
Physician Discharge Summary  Tiffany [name]. [name] UJW:[id number] DOB: September 18, 1950 DOA: 02/05/2017  PCP: Erick Colace, MD  Admit date: 02/05/2017 Discharge date: 02/07/2017  Admitted From:home Disposition:home with home care  Recommendations for Outpatient Follow-up:  1. Follow up with PCP in 1-2 weeks 2. Please obtain BMP/CBC in one week   Home Health:yes Equipment/Devices:no Discharge Condition:stable CODE STATUS:full code Diet recommendation:heart healthy  Brief/Interim Summary: 66 y.o.femalewith medical history significant ofHTN, HLD, CAD, CVA with residual right-sided deficit, and anxiety presented with shortness throat, nonproductive cough, nausea vomiting diarrhea and generalized weakness for about a week. Patient also with increased urinary frequency. In the ER patient was found to be febrile with temperature up to 101.9 tachycardic. Repeat a strep negative. UA with UTI. Admitted for further evaluation.  # Sepsis secondary to urinary tract infection/possible acute pyelonephritis:  -Patient with fever, nausea vomiting tachycardia, leukocytosis, increased urinary frequency on admission. UA positive. CT scan with perinephric stranding. -Treated with IV ceftriaxone with clinical improvement. Mental status improved. Plan to discharge with oral Keflex for 10 days. Cultures negative.  #Fall at home with mild confusion and generalized weakness, known history of left MCA stroke: Denied loss of consciousness. CT scan of brain without contrast with left MCA territory infarction likely chronic. Carotid Doppler with known left ICA occlusion and right ICAs preliminary 50-75%. I reviewed both CT scan and carotid Doppler finding with Dr. Pearlean Brownie from the stroke team. This finding are chronic in nature and no inpatient intervention required. Patient is already on Lipitor, Aggrenox. Mental status improved. Recommended to follow up with neurologist outpatient. -PT OT evaluation done and patient is  being discharged home with home care services.Marland Kitchen  #Sore throat: Initial rapid strep negative. Clinically improved. Viral studies negative.Esophageal x-ray with possible esophageal motility disorder with no stricture or mass. Patient reported that she can swallow without difficulty. Recommended to follow up with PCP and possibly may need GI referral.   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 Continue current management.   #Hypokalemia: Replete both IV and oral potassium chloride. Discharge with oral potassium chloride. 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 Recommended outpatient follow-up with GI.  #Chronic diastolic congestive heart failure: Clinically stable.  #Anxiety depression: Continue home medication.  #Hyperlipidemia continue Lipitor.   Discharge Diagnoses:  Principal Problem:   Sepsis (HCC) Active Problems:   Dyslipidemia   Spastic hemiplegia affecting dominant side (HCC)   History of CVA with residual deficit   Nausea vomiting and diarrhea   Anxiety and depression   Urinary tract infection with hematuria    Discharge Instructions  Discharge Instructions    Call MD for:  difficulty breathing, headache or visual disturbances    Complete by:  As directed    Call MD for:  extreme fatigue    Complete by:  As directed    Call MD for:  hives    Complete by:  As directed    Call MD for:  persistant dizziness or light-headedness    Complete by:  As directed    Call MD for:  persistant nausea and vomiting    Complete by:  As directed    Call MD for:  severe uncontrolled pain    Complete by:  As directed    Call MD for:  temperature >100.4    Complete by:  As directed    Diet - low sodium heart healthy  Complete by:  As directed    Discharge instructions    Complete by:  As directed    . Check CBC and BMP within a  week with your PCP.   Face-to-face encounter (required for Medicare/Medicaid patients)    Complete by:  As directed    I Maurio Baize Jaynie Collins certify that this patient is under my care and that I, or a nurse practitioner or physician's assistant working with me, had a face-to-face encounter that meets the physician face-to-face encounter requirements with this patient on 02/07/2017. The encounter with the patient was in whole, or in part for the following medical condition(s) which is the primary reason for home health care (List medical condition): stroke, weakness, UTI   The encounter with the patient was in whole, or in part, for the following medical condition, which is the primary reason for home health care:  stroke, weakness, UTI   I certify that, based on my findings, the following services are medically necessary home health services:   Nursing Physical therapy     Reason for Medically Necessary Home Health Services:  Skilled Nursing- Skilled Assessment/Observation   My clinical findings support the need for the above services:  Unable to leave home safely without assistance and/or assistive device   Further, I certify that my clinical findings support that this patient is homebound due to:  Unable to leave home safely without assistance   Home Health    Complete by:  As directed    To provide the following care/treatments:   PT OT Home Health Aide     Increase activity slowly    Complete by:  As directed      Allergies as of 02/07/2017      Reactions   Macrobid [nitrofurantoin Monohyd Macro] Swelling   Angioedema!!   Benadryl [diphenhydramine Hcl (sleep)] Other (See Comments)   Bugs crawling sensation   Codeine Nausea Only      Medication List    TAKE these medications   acetaminophen 325 MG tablet Commonly known as:  TYLENOL Take 325-650 mg by mouth every 6 (six) hours as needed (for headaches).   atorvastatin 80 MG tablet Commonly known as:  LIPITOR Take 80 mg by  mouth at bedtime.   baclofen 10 MG tablet Commonly known as:  LIORESAL Take 1 tablet (10 mg total) by mouth 2 (two) times daily.   buPROPion 300 MG 24 hr tablet Commonly known as:  WELLBUTRIN XL Take 300 mg by mouth at bedtime.   cephALEXin 500 MG capsule Commonly known as:  KEFLEX Take 1 capsule (500 mg total) by mouth 2 (two) times daily.   cetirizine 10 MG tablet Commonly known as:  ZYRTEC Take 10 mg by mouth daily as needed for allergies or rhinitis.   citalopram 40 MG tablet Commonly known as:  CELEXA Take 40 mg by mouth every morning.   clonazePAM 0.5 MG tablet Commonly known as:  KLONOPIN Take 0.5 mg by mouth 2 (two) times daily as needed for anxiety.   dipyridamole-aspirin 200-25 MG 12hr capsule Commonly known as:  AGGRENOX Take 1 capsule by mouth 2 (two) times daily.   gabapentin 300 MG capsule Commonly known as:  NEURONTIN Take 1 capsule (300 mg total) by mouth 4 (four) times daily. What changed:  when to take this   mometasone 50 MCG/ACT nasal spray Commonly known as:  NASONEX Place 2 sprays into the nose daily.   nabumetone 750 MG tablet Commonly known as:  RELAFEN Take 750 mg  by mouth daily.   potassium chloride SA 20 MEQ tablet Commonly known as:  K-DUR,KLOR-CON Take 1 tablet (20 mEq total) by mouth 2 (two) times daily.      Follow-up Information    Kirsteins, Victorino Sparrow, MD. Schedule an appointment as soon as possible for a visit in 1 week(s).   Specialty:  Physical Medicine and Rehabilitation Contact information: [address] [place] [address] [place] [address] [phone]        Micki Riley, MD. Schedule an appointment as soon as possible for a visit in 1 month(s).   Specialties:  Neurology, Radiology Contact information: [address] [place] [address] [phone]          Allergies  Allergen Reactions  . Macrobid [Nitrofurantoin Monohyd Macro] Swelling    Angioedema!!  . Benadryl [Diphenhydramine Hcl  (Sleep)] Other (See Comments)    Bugs crawling sensation  . Codeine Nausea Only    Consultations: Phone discussion with neurology  Procedures/Studies: Carotid ultrasound  Subjective: Seen and examined at bedside. Patient was alert awake and oriented. Denied headache, dizziness, nausea vomiting chest pain shortness of breath. Eager to go home today. Reported that she can swallow without any difficulties. No sore throat.  Discharge Exam: Vitals:   02/07/17 0014 02/07/17 0522  BP: (!) 91/53 (!) 115/56  Pulse: 99 91  Resp: 20 18  Temp: 99.3 F (37.4 C) 98.6 F (37 C)  SpO2: 95% 93%   Vitals:   02/06/17 0603 02/06/17 1339 02/07/17 0014 02/07/17 0522  BP: (!) 106/54 102/64 (!) 91/53 (!) 115/56  Pulse: (!) 101 93 99 91  Resp: 18 16 20 18   Temp: 98.9 F (37.2 C)  99.3 F (37.4 C) 98.6 F (37 C)  TempSrc: Oral  Oral Oral  SpO2: 94% 96% 95% 93%  Weight:        General: Pt is alert, awake, not in acute distress Cardiovascular: RRR, S1/S2 +, no rubs, no gallops Respiratory: CTA bilaterally, no wheezing, no rhonchi Abdominal: Soft, NT, ND, bowel sounds + Extremities: no edema, no cyanosis, Right sided weakness.    The results of significant diagnostics from this hospitalization (including imaging, microbiology, ancillary and laboratory) are listed below for reference.     Microbiology: Recent Results (from the past 240 hour(s))  Rapid strep screen     Status: None   Collection Time: 02/05/17  2:38 PM  Result Value Ref Range Status   Streptococcus, Group A Screen (Direct) NEGATIVE NEGATIVE Final    Comment: (NOTE) A Rapid Antigen test may result negative if the antigen level in the sample is below the detection level of this test. The FDA has not cleared this test as a stand-alone test therefore the rapid antigen negative result has reflexed to a Group A Strep culture.   Culture, group A strep     Status: None (Preliminary result)   Collection Time: 02/05/17  2:38 PM   Result Value Ref Range Status   Specimen Description THROAT  Final   Special Requests NONE Reflexed from [id number]  Final   Culture CULTURE REINCUBATED FOR BETTER GROWTH  Final   Report Status PENDING  Incomplete  Blood Culture (routine x 2)     Status: None (Preliminary result)   Collection Time: 02/05/17  3:50 PM  Result Value Ref Range Status   Specimen Description BLOOD RIGHT ANTECUBITAL  Final   Special Requests IN PEDIATRIC BOTTLE Blood Culture adequate volume  Final   Culture NO GROWTH 2 DAYS  Final   Report Status PENDING  Incomplete  Blood Culture (routine x 2)     Status: None (Preliminary result)   Collection Time: 02/05/17  4:00 PM  Result Value Ref Range Status   Specimen Description BLOOD RIGHT HAND  Final   Special Requests IN PEDIATRIC BOTTLE Blood Culture adequate volume  Final   Culture NO GROWTH 2 DAYS  Final   Report Status PENDING  Incomplete  Gastrointestinal Panel by PCR , Stool     Status: None   Collection Time: 02/05/17 10:43 PM  Result Value Ref Range Status   Campylobacter species NOT DETECTED NOT DETECTED Final   Plesimonas shigelloides NOT DETECTED NOT DETECTED Final   Salmonella species NOT DETECTED NOT DETECTED Final   Yersinia enterocolitica NOT DETECTED NOT DETECTED Final   Vibrio species NOT DETECTED NOT DETECTED Final   Vibrio cholerae NOT DETECTED NOT DETECTED Final   Enteroaggregative E coli (EAEC) NOT DETECTED NOT DETECTED Final   Enteropathogenic E coli (EPEC) NOT DETECTED NOT DETECTED Final   Enterotoxigenic E coli (ETEC) NOT DETECTED NOT DETECTED Final   Shiga like toxin producing E coli (STEC) NOT DETECTED NOT DETECTED Final   Shigella/Enteroinvasive E coli (EIEC) NOT DETECTED NOT DETECTED Final   Cryptosporidium NOT DETECTED NOT DETECTED Final   Cyclospora cayetanensis NOT DETECTED NOT DETECTED Final   Entamoeba histolytica NOT DETECTED NOT DETECTED Final   Giardia lamblia NOT DETECTED NOT DETECTED Final   Adenovirus F40/41 NOT DETECTED  NOT DETECTED Final   Astrovirus NOT DETECTED NOT DETECTED Final   Norovirus GI/GII NOT DETECTED NOT DETECTED Final   Rotavirus A NOT DETECTED NOT DETECTED Final   Sapovirus (I, II, IV, and V) NOT DETECTED NOT DETECTED Final  Respiratory Panel by PCR     Status: None   Collection Time: 02/06/17  7:43 AM  Result Value Ref Range Status   Adenovirus NOT DETECTED NOT DETECTED Final   Coronavirus 229E NOT DETECTED NOT DETECTED Final   Coronavirus HKU1 NOT DETECTED NOT DETECTED Final   Coronavirus NL63 NOT DETECTED NOT DETECTED Final   Coronavirus OC43 NOT DETECTED NOT DETECTED Final   Metapneumovirus NOT DETECTED NOT DETECTED Final   Rhinovirus / Enterovirus NOT DETECTED NOT DETECTED Final   Influenza A NOT DETECTED NOT DETECTED Final   Influenza A H1 NOT DETECTED NOT DETECTED Final   Influenza A H1 2009 NOT DETECTED NOT DETECTED Final   Influenza A H3 NOT DETECTED NOT DETECTED Final   Influenza B NOT DETECTED NOT DETECTED Final   Parainfluenza Virus 1 NOT DETECTED NOT DETECTED Final   Parainfluenza Virus 2 NOT DETECTED NOT DETECTED Final   Parainfluenza Virus 3 NOT DETECTED NOT DETECTED Final   Parainfluenza Virus 4 NOT DETECTED NOT DETECTED Final   Respiratory Syncytial Virus NOT DETECTED NOT DETECTED Final   Bordetella pertussis NOT DETECTED NOT DETECTED Final   Chlamydophila pneumoniae NOT DETECTED NOT DETECTED Final   Mycoplasma pneumoniae NOT DETECTED NOT DETECTED Final     Labs: BNP (last 3 results) No results for input(s): BNP in the last 8760 hours. Basic Metabolic Panel:  Recent Labs Lab 02/05/17 1556 02/06/17 0532 02/07/17 0303  NA 135 138 142  K 3.6 2.9* 3.4*  CL 100* 106 110  CO2 23 23 26   GLUCOSE 103* 89 83  BUN 13 12 9   CREATININE 1.00 0.85 0.76  CALCIUM 8.1* 7.5* 7.7*   Liver Function Tests:  Recent Labs Lab 02/05/17 1556  AST 22  ALT 12*  ALKPHOS  63  BILITOT 0.9  PROT 5.8*  ALBUMIN 3.1*   No results for input(s): LIPASE, AMYLASE in the last 168  hours. No results for input(s): AMMONIA in the last 168 hours. CBC:  Recent Labs Lab 02/05/17 1556 02/06/17 0532 02/07/17 0303  WBC 13.9* 13.9* 10.9*  NEUTROABS 11.8*  --   --   HGB 13.8 11.8* 11.7*  HCT 42.3 36.1 35.2*  MCV 94.6 92.8 93.1  PLT 180 170 192   Cardiac Enzymes: No results for input(s): CKTOTAL, CKMB, CKMBINDEX, TROPONINI in the last 168 hours. BNP: Invalid input(s): POCBNP CBG: No results for input(s): GLUCAP in the last 168 hours. D-Dimer No results for input(s): DDIMER in the last 72 hours. Hgb A1c No results for input(s): HGBA1C in the last 72 hours. Lipid Profile No results for input(s): CHOL, HDL, LDLCALC, TRIG, CHOLHDL, LDLDIRECT in the last 72 hours. Thyroid function studies No results for input(s): TSH, T4TOTAL, T3FREE, THYROIDAB in the last 72 hours.  Invalid input(s): FREET3 Anemia work up No results for input(s): VITAMINB12, FOLATE, FERRITIN, TIBC, IRON, RETICCTPCT in the last 72 hours. Urinalysis    Component Value Date/Time   COLORURINE YELLOW 02/05/2017 1440   APPEARANCEUR HAZY (A) 02/05/2017 1440   LABSPEC 1.021 02/05/2017 1440   PHURINE 5.0 02/05/2017 1440   GLUCOSEU NEGATIVE 02/05/2017 1440   HGBUR NEGATIVE 02/05/2017 1440   BILIRUBINUR NEGATIVE 02/05/2017 1440   KETONESUR NEGATIVE 02/05/2017 1440   PROTEINUR NEGATIVE 02/05/2017 1440   UROBILINOGEN 0.2 11/03/2012 1600   NITRITE POSITIVE (A) 02/05/2017 1440   LEUKOCYTESUR NEGATIVE 02/05/2017 1440   Sepsis Labs Invalid input(s): PROCALCITONIN,  WBC,  LACTICIDVEN Microbiology Recent Results (from the past 240 hour(s))  Rapid strep screen     Status: None   Collection Time: 02/05/17  2:38 PM  Result Value Ref Range Status   Streptococcus, Group A Screen (Direct) NEGATIVE NEGATIVE Final    Comment: (NOTE) A Rapid Antigen test may result negative if the antigen level in the sample is below the detection level of this test. The FDA has not cleared this test as a stand-alone test  therefore the rapid antigen negative result has reflexed to a Group A Strep culture.   Culture, group A strep     Status: None (Preliminary result)   Collection Time: 02/05/17  2:38 PM  Result Value Ref Range Status   Specimen Description THROAT  Final   Special Requests NONE Reflexed from [id number]  Final   Culture CULTURE REINCUBATED FOR BETTER GROWTH  Final   Report Status PENDING  Incomplete  Blood Culture (routine x 2)     Status: None (Preliminary result)   Collection Time: 02/05/17  3:50 PM  Result Value Ref Range Status   Specimen Description BLOOD RIGHT ANTECUBITAL  Final   Special Requests IN PEDIATRIC BOTTLE Blood Culture adequate volume  Final   Culture NO GROWTH 2 DAYS  Final   Report Status PENDING  Incomplete  Blood Culture (routine x 2)     Status: None (Preliminary result)   Collection Time: 02/05/17  4:00 PM  Result Value Ref Range Status   Specimen Description BLOOD RIGHT HAND  Final   Special Requests IN PEDIATRIC BOTTLE Blood Culture adequate volume  Final   Culture NO GROWTH 2 DAYS  Final   Report Status PENDING  Incomplete  Gastrointestinal Panel by PCR , Stool     Status: None   Collection Time: 02/05/17 10:43 PM  Result Value Ref Range Status   Campylobacter species  NOT DETECTED NOT DETECTED Final   Plesimonas shigelloides NOT DETECTED NOT DETECTED Final   Salmonella species NOT DETECTED NOT DETECTED Final   Yersinia enterocolitica NOT DETECTED NOT DETECTED Final   Vibrio species NOT DETECTED NOT DETECTED Final   Vibrio cholerae NOT DETECTED NOT DETECTED Final   Enteroaggregative E coli (EAEC) NOT DETECTED NOT DETECTED Final   Enteropathogenic E coli (EPEC) NOT DETECTED NOT DETECTED Final   Enterotoxigenic E coli (ETEC) NOT DETECTED NOT DETECTED Final   Shiga like toxin producing E coli (STEC) NOT DETECTED NOT DETECTED Final   Shigella/Enteroinvasive E coli (EIEC) NOT DETECTED NOT DETECTED Final   Cryptosporidium NOT DETECTED NOT DETECTED Final    Cyclospora cayetanensis NOT DETECTED NOT DETECTED Final   Entamoeba histolytica NOT DETECTED NOT DETECTED Final   Giardia lamblia NOT DETECTED NOT DETECTED Final   Adenovirus F40/41 NOT DETECTED NOT DETECTED Final   Astrovirus NOT DETECTED NOT DETECTED Final   Norovirus GI/GII NOT DETECTED NOT DETECTED Final   Rotavirus A NOT DETECTED NOT DETECTED Final   Sapovirus (I, II, IV, and V) NOT DETECTED NOT DETECTED Final  Respiratory Panel by PCR     Status: None   Collection Time: 02/06/17  7:43 AM  Result Value Ref Range Status   Adenovirus NOT DETECTED NOT DETECTED Final   Coronavirus 229E NOT DETECTED NOT DETECTED Final   Coronavirus HKU1 NOT DETECTED NOT DETECTED Final   Coronavirus NL63 NOT DETECTED NOT DETECTED Final   Coronavirus OC43 NOT DETECTED NOT DETECTED Final   Metapneumovirus NOT DETECTED NOT DETECTED Final   Rhinovirus / Enterovirus NOT DETECTED NOT DETECTED Final   Influenza A NOT DETECTED NOT DETECTED Final   Influenza A H1 NOT DETECTED NOT DETECTED Final   Influenza A H1 2009 NOT DETECTED NOT DETECTED Final   Influenza A H3 NOT DETECTED NOT DETECTED Final   Influenza B NOT DETECTED NOT DETECTED Final   Parainfluenza Virus 1 NOT DETECTED NOT DETECTED Final   Parainfluenza Virus 2 NOT DETECTED NOT DETECTED Final   Parainfluenza Virus 3 NOT DETECTED NOT DETECTED Final   Parainfluenza Virus 4 NOT DETECTED NOT DETECTED Final   Respiratory Syncytial Virus NOT DETECTED NOT DETECTED Final   Bordetella pertussis NOT DETECTED NOT DETECTED Final   Chlamydophila pneumoniae NOT DETECTED NOT DETECTED Final   Mycoplasma pneumoniae NOT DETECTED NOT DETECTED Final     Time coordinating discharge: 33 minutes  SIGNED:   Maxie Barb, MD  [facility] 02/07/2017, 1:11 PM  If 7PM-7AM, please contact night-coverage [email or web address] Password [email or web address]

## 2017-02-07 NOTE — Evaluation (Signed)
Occupational Therapy Evaluation Patient Details Name: Tiffany [name]. [name] MRN: [id number] DOB: 11/19/1950 Today's Date: 02/07/2017    History of Present Illness 66 y.o. female with medical history significant of HTN, HLD, CAD, CVA with residual right-sided deficit, and anxiety; presented 8/18 with general malaise/constitutional sign/symptoms; admit for sepsis secondary to UTI. Fell x2 at home pta with one strike to head (no LOC).   Clinical Impression   This 66 y/o F presents with the above. Pt lives at home with spouse, and reports at baseline she is mod independent for ADLs and functional mobility, reporting she receives assist from spouse for transfers to/from bathtub. Pt requires ModA for sit<>stand at RW this session and MinA for side stepping along bedside with additional OOB mobility deferred as transport arriving to take Pt for test. Pt currently requires ModA for completing LB ADLs. Pt will benefit from continued acute OT services and feel Pt will benefit from additional OT services in home health setting after return home to maximize Pt's safety and independence with ADLs and functional mobility.     Follow Up Recommendations  Home health OT;Supervision/Assistance - 24 hour    Equipment Recommendations  None recommended by OT           Precautions / Restrictions Precautions Precautions: Fall Precaution Comments: alarm, up with assist, use RW, residual R hemiplegia Restrictions Weight Bearing Restrictions: No      Mobility Bed Mobility Overal bed mobility: Needs Assistance Bed Mobility: Supine to Sit     Supine to sit: Min assist     General bed mobility comments: supine to sit at R side of bed; Pt able to bring LEs off EOB, MinA for trunk support and to scoot to EOB  Transfers Overall transfer level: Needs assistance Equipment used: Rolling walker (2 wheeled) Transfers: Sit to/from Stand Sit to Stand: Mod assist         General transfer comment: ModA to rise;  Pt able to maintain static standing at RW and side steps along bedside with MinA. verbal cues for hand placement prior to sitting/standing     Balance Overall balance assessment: Needs assistance;History of Falls Sitting-balance support: No upper extremity supported;Feet supported Sitting balance-Leahy Scale: Fair Sitting balance - Comments: sitting EOB while demonstrating figure 4 position to adjust socks    Standing balance support: Bilateral upper extremity supported;During functional activity Standing balance-Leahy Scale: Poor Standing balance comment: dependent on RW for support in standing                           ADL either performed or assessed with clinical judgement   ADL Overall ADL's : Needs assistance/impaired Eating/Feeding: Set up;Sitting   Grooming: Set up;Sitting   Upper Body Bathing: Sitting;Minimal assistance   Lower Body Bathing: Moderate assistance;Sit to/from stand   Upper Body Dressing : Minimal assistance;Sitting Upper Body Dressing Details (indicate cue type and reason): doffing/donning new gown  Lower Body Dressing: Sit to/from stand;Moderate assistance Lower Body Dressing Details (indicate cue type and reason): Pt demonstrates figure 4 technique while sitting EOB  Toilet Transfer: Stand-pivot;BSC;RW;Moderate assistance   Toileting- Clothing Manipulation and Hygiene: Sit to/from stand;Moderate assistance       Functional mobility during ADLs: Minimal assistance;Rolling walker General ADL Comments: limited eval due to transport arriving to take Pt for test; Pt sat EOB, stood at RW with ModA, was able to maintain static standing with MinGuard assist while therapist changed chuck pad, took side steps along bedside with  MinA prior to returning to sitting                          Pertinent Vitals/Pain Pain Assessment: Faces Faces Pain Scale: Hurts little more Pain Location: R hip  Pain Descriptors / Indicators: Sore;Aching Pain  Intervention(s): Limited activity within patient's tolerance;Monitored during session;Repositioned     Hand Dominance Right   Extremity/Trunk Assessment Upper Extremity Assessment Upper Extremity Assessment: RUE deficits/detail RUE Deficits / Details: residual hemiplegia in RUE; tremors noted during movement, unable to fully assess ROM as transport arriving to take Pt for test Pt demonstrates ability to reach towards top of head   Lower Extremity Assessment Lower Extremity Assessment: Defer to PT evaluation   Cervical / Trunk Assessment Cervical / Trunk Assessment: Normal   Communication Communication Communication: No difficulties   Cognition Arousal/Alertness: Awake/alert Behavior During Therapy: WFL for tasks assessed/performed Overall Cognitive Status: Within Functional Limits for tasks assessed                                                      Home Living Family/patient expects to be discharged to:: Private residence Living Arrangements: Spouse/significant other Available Help at Discharge: Family;Available 24 hours/day Type of Home: House Home Access: Level entry     Home Layout: One level     Bathroom Shower/Tub: Chief Strategy Officer: Standard     Home Equipment: Environmental consultant - 2 wheels;Shower seat;Toilet riser   Additional Comments: just moved to new home      Prior Functioning/Environment Level of Independence: Needs assistance  Gait / Transfers Assistance Needed: independent with device ADL's / Homemaking Assistance Needed: doesn't cook, clean or drive; reports spouse assists with stepping into/out of tub, reports she was able to complete bathing and dressing ADLs without assist             OT Problem List: Decreased strength;Decreased activity tolerance;Impaired balance (sitting and/or standing)            OT Goals(Current goals can be found in the care plan section) Acute Rehab OT Goals Patient Stated Goal: go  home, stop falling, feel stronger OT Goal Formulation: With patient Time For Goal Achievement: 02/21/17 Potential to Achieve Goals: Good  OT Frequency: Min 2X/week                             AM-PAC PT "6 Clicks" Daily Activity     Outcome Measure Help from another person eating meals?: None Help from another person taking care of personal grooming?: A Little Help from another person toileting, which includes using toliet, bedpan, or urinal?: A Lot Help from another person bathing (including washing, rinsing, drying)?: A Lot Help from another person to put on and taking off regular upper body clothing?: A Little Help from another person to put on and taking off regular lower body clothing?: A Lot 6 Click Score: 16   End of Session Equipment Utilized During Treatment: Gait belt;Rolling walker Nurse Communication: Mobility status  Activity Tolerance: Patient tolerated treatment well Patient left: in bed;with call bell/phone within reach;Other (comment) (transport present to take Pt for test )  OT Visit Diagnosis: Unsteadiness on feet (R26.81);Muscle weakness (generalized) (M62.81)  Time: 1610-9604 OT Time Calculation (min): 22 min Charges:  OT General Charges $OT Visit: 1 Procedure OT Evaluation $OT Eval Low Complexity: 1 Procedure G-Codes:     Marcy Siren, OT Pager [phone] 02/07/2017   Orlando Penner 02/07/2017, 12:43 PM

## 2017-02-07 NOTE — Care Management Note (Signed)
Case Management Note  Patient Details  Name: Tiffany [name]. Hommel MRN: [id number] Date of Birth: 1951-01-15  Subjective/Objective:    Sepsis s/t UTI, fall at home,                 Action/Plan: Discharge Planning: AVS reviewed: Spoke to pt and offered choice for [facility]. States she had [facility] in the past. Contacted [facility] with new referral. They accepted referral. Pt has RW and bedside commode at home. Lives at home with husband. Sister lives next door to assist with care as needed.   PCP Erick Colace  Expected Discharge Date:  02/07/17               Expected Discharge Plan:  Home w Home Health Services  In-House Referral:  NA  Discharge planning Services  CM Consult  Post Acute Care Choice:  Home Health Choice offered to:  Patient  DME Arranged:  N/A DME Agency:  NA  HH Arranged:  PT, OT, Nurse's Aide HH Agency:  [facility]  Status of Service:  Completed, signed off  If discussed at Long Length of Stay Meetings, dates discussed:    Additional Comments:  Elliot Cousin, RN 02/07/2017, 3:39 PM

## 2017-02-10 LAB — CULTURE, BLOOD (ROUTINE X 2)
Culture: NO GROWTH
Culture: NO GROWTH
Special Requests: ADEQUATE
Special Requests: ADEQUATE

## 2017-03-11 NOTE — Progress Notes (Signed)
PT evaluation addendum Late entry for February 25, 2017 G-codes    02/25/2017 1500  PT Time Calculation  PT Start Time (ACUTE ONLY) 1410  PT Stop Time (ACUTE ONLY) 1500  PT Time Calculation (min) (ACUTE ONLY) 50 min  PT G-Codes **NOT FOR INPATIENT CLASS**  Functional Assessment Tool Used AM-PAC 6 Clicks Basic Mobility  Functional Limitation Mobility: Walking and moving around  Mobility: Walking and Moving Around Current Status (N8295) CK  Mobility: Walking and Moving Around Goal Status (A2130) CI  PT General Charges  $$ ACUTE PT VISIT 1 Visit  PT Evaluation  $PT Eval High Complexity 1 High  PT Treatments  $Gait Training 8-22 mins  $Therapeutic Activity 23-37 mins  03/11/2017 [place], [place] [phone]

## 2017-03-11 NOTE — Progress Notes (Addendum)
OT G-Codes:     02/07/17 1242  OT G-codes **NOT FOR INPATIENT CLASS**  Functional Assessment Tool Used AM-PAC 6 Clicks Daily Activity;Clinical judgement  Functional Limitation Self care  Self Care Current Status (B1478) CK  Self Care Goal Status (G9562) CI   Marcy Siren, OT Pager [phone] 03/11/2017

## 2017-11-08 NOTE — Progress Notes (Unsigned)
Error

## 2017-11-30 NOTE — Progress Notes (Signed)
[facility]  PATIENT: Tiffany [name]. Penman DOB: 1951/04/12   REASON FOR VISIT: Follow-up for history of stroke HISTORY FROM: Patient and daughter    HISTORY OF PRESENT ILLNESS:63 year Caucasian lady seen for first office f/u visit after [facility] admission on 10/16/12 With acute onset aphasia and right hemiparesis and right facial droop.NIHSS was 13 on admission and she was treated with iv TPA . CT head subsequently showed subtle hypodensity in left periinsular region without hemorrhage . MRI showed left insular and opercular cortex infarct with mild cytotoxic edema.MRA brain showed occluded left m 1 MCA.Transthoraxic echo showed normal ejection fraction.TEE showed no clot or PFO.Carotid dopplers showed left ICA occlusion and greater than 80% RICA stenosis.She has h/o transient right sided weakness in March 2014 and saw Dr Tiffany [name] a neurologist in [place] who did outpatient MRi showing small left brain infarct. MRA neck was ordered as outpatient and results were pending at the time of her stroke on 10/16/12. Vascular risk factors identified included HTN, Hyperlipidimia,CAD,Obesity, carotid occlusion and TIA.Patient was seen by therapies and transferred to inpatient rehab and has done well with good recovery of strength and speech except when she is tired when she gets words hesitancy and dragging of right leg transiently. 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 She has been compliant with her medicines and and has finished her therapy sessions. Her blood pressure is good and is 124/68 today.She walks with a cane with good balance and no recent falls. She underwent elective right ICA stenting by Dr Tiffany [name] on 11/09/12 and is tolerating aspirin and plavix but has increased bruising but no bleeding.  UPDATE 06/25/13 (LL): Patient returns to office for stroke follow up. Carotid duplex completed on 03/19/13 showing  Right: Stent patent. No evidence of restenosis. Left: Occluded internal carotid artery. She has improved on the use of her right hand and right leg; is continuing to do exercises learned in therapies. She is walking short distnaces without cane. Plans to start water aerobics. She lacks fine motor control in right hand but it is getting better. Her speech is improved as well, still with expressive deficits. She has considerable depression post stroke, mourning the loss of freedom and traveling full-time job. She used to take Effexor XR 150 mg but was reduced to 75 mg daily after first TIA's occurred in April and was never switched back to 150 mg daily dose after hospitalization. She has not returned to driving, adding to her depression. She has had 2 injections to frozen shoulder and is due for a third with Dr. Larna [name]. Bp is well controlled; is 133/86 in office today. She is tolerating Plavix and aspirin with mild bruising, no bleeding.   UPDATE 01/08/14 (LL): Since last visit, she has had continued emotional lability, even with increased dose of Venlafaxine. She cries very easily, sometimes for no apparent reason. Denies inappropriate laughter. Walking without cane now, dragging right foot less. Denies any falls. Has not been able to return to driving due to weakness of right leg, also sensory loss. She is tolerating Plavix and aspirin with mild bruising, no bleeding. Blood pressure is well controlled, is 128/74 in the office today. Continues to follow up with Dr. Wynn [name], shoulder injections have helped.  UPDATE 05/09/14 (LL): Since last visit, she has had more difficulty with walking, and has fallen twice recently.  She is now using a rollator walker for assistance. She says her depression  is worse, her daughter agrees, and she increased her sertraline to 25 mg in the morning and night. She has some days of diarrhea, but it is not consistently every day. Her daughter states she doesn't eat like she  should and that her elimination pattern has always been variable, questionably IBS. She has been following up with Dr. Wynn BankerKirsteins for her right foot and right shoulder. She is tolerating Plavix well with no signs of significant bleeding or bruising. Blood pressure is well controlled, it is 124/71 in the office today.   Update 5/24/2017PS : She returns for follow-up after last visit in November 2015. She is accompanied by her daughter. The patient was [name]. I have reviewed imaging studies personallywhich showed bilateral frontotemporal small infarcts from small vessel disease.She was on Plavix and was switched to Aggrenox She does have residual right-sided weakness and gait and balance difficulties. She walks with a walker mostly only short distances and requires one-person assist. She's had 2 falls in the last 1 year particular eventual scarring and time to walk quickly. She is tolerating Aggrenox well. She states her blood pressure is pretty good today it is 110/70 in fact Tiffany [name] had to be discontinued. She is tolerating Lipitor well and 8 months ago the dose was increased and subsequent follow-up lipid profiles have been fine. She is still living at home with her husband. She has not had any follow-up carotid Dopplers done. UPDATE 11/27/2017[name], 67 year old female returns for follow-up with her daughter. She had an [name] with her second stroke her previous stroke was in 2014. She is currently on Aggrenox for secondary stroke prevention without recurrent stroke or TIA symptoms. She has minimal bruising and no bleeding She has frequent UTIs and sees a urologist. Trans  cranial Doppler 11/26/15  showed expected compensatory  changes in the brain vessels with known  previous carotid occlusion on the left no new worrisome findings. She denies any recent falls and uses a walker or cane in her home. She  continues to have residual right-sided weakness and gait and balance difficulties. She is tolerating Lipitor without muscle myalgias and she reports recent lipid panel was okay I do not have access to those results. She returns for reevaluation UPDATE 6/13/2019[name], 67 year old female returns for follow-up with history of stroke event in May 2016 which was her second stroke her previous stroke was in 2014 remains on Aggrenox for secondary stroke prevention without recurrent stroke or TIA symptoms since that time.  She has minimal bruising and no bleeding she remains on Lipitor without myalgias.  She has  back pain and uses a walker to ambulate.  She denies any recent falls.  She is due  to get epidural injections.  She was made aware she must come off Aggrenox  and there is a small chance that she could have  a stroke while being off of the medication.  Blood pressure well controlled in the office today 123/74.  She lives with her husband independent in activities of daily living she does not drive. Last carotid doppler 02/07/17 Technically limited study due to thick neck and depth of vessels. Appears to be 50-75% Right ICA stent stenosis. Known occlusion of the Left ICA. Bilateral antegrade vertebral flow. She returns for reevaluation  REVIEW OF SYSTEMS: Full 14 system review of systems performed and notable only for those listed, all others are neg:  Constitutional: Fatigue  Cardiovascular: neg Ear/Nose/Throat: neg  Skin: neg Eyes: neg Respiratory: neg Gastroitestinal: neg  Hematology/Lymphatic: neg  Endocrine: neg Musculoskeletal: Walking difficulty, joint pain Allergy/Immunology: neg  Neurological: neg Psychiatric: neg Sleep : neg   ALLERGIES: Allergies  Allergen Reactions  . Macrobid [Nitrofurantoin Monohyd Macro] Swelling    Angioedema!!  . Benadryl [Diphenhydramine Hcl (Sleep)] Other (See Comments)    Bugs crawling sensation  . Codeine Nausea Only    HOME  MEDICATIONS: Outpatient Medications Prior to Visit  Medication Sig Dispense Refill  . acetaminophen (TYLENOL) 325 MG tablet Take 325-650 mg by mouth every 6 (six) hours as needed (for headaches).    Marland Kitchen atorvastatin (LIPITOR) 80 MG tablet Take 80 mg by mouth at bedtime.     . baclofen (LIORESAL) 10 MG tablet Take 1 tablet (10 mg total) by mouth 2 (two) times daily. 60 each 2  . buPROPion (WELLBUTRIN XL) 300 MG 24 hr tablet Take 300 mg by mouth at bedtime.     . citalopram (CELEXA) 40 MG tablet Take 40 mg by mouth every morning.     . clonazePAM (KLONOPIN) 0.5 MG tablet Take 0.5 mg by mouth 2 (two) times daily as needed for anxiety.     . dipyridamole-aspirin (AGGRENOX) 200-25 MG 12hr capsule Take 1 capsule by mouth 2 (two) times daily.    Marland Kitchen gabapentin (NEURONTIN) 300 MG capsule Take 1 capsule (300 mg total) by mouth 4 (four) times daily. (Patient taking differently: Take 300 mg by mouth 3 (three) times daily. ) 120 capsule 5  . mometasone (NASONEX) 50 MCG/ACT nasal spray Place 2 sprays into the nose daily. 17 g 5  . nabumetone (RELAFEN) 750 MG tablet Take 750 mg by mouth daily.     . cetirizine (ZYRTEC) 10 MG tablet Take 10 mg by mouth daily as needed for allergies or rhinitis.     . potassium chloride SA (K-DUR,KLOR-CON) 20 MEQ tablet Take 1 tablet (20 mEq total) by mouth 2 (two) times daily. 30 tablet 0   Facility-Administered Medications Prior to Visit  Medication Dose Route Frequency Provider Last Rate Last Dose  . fentaNYL (SUBLIMAZE) injection 25-50 mcg  25-50 mcg Intravenous Q5 min PRN Bedelia Person, MD        PAST MEDICAL HISTORY: Past Medical History:  Diagnosis Date  . Arthritis   . Coronary artery disease   . High cholesterol   . Hypertension   . MI (myocardial infarction) (HCC)   . Stroke (HCC)   . TIA (transient ischemic attack)     PAST SURGICAL HISTORY: Past Surgical History:  Procedure Laterality Date  . CARDIAC SURGERY    . CORONARY ARTERY BYPASS GRAFT    . RADIOLOGY  WITH ANESTHESIA N/A 11/09/2012   Procedure: RADIOLOGY WITH ANESTHESIA- CAROTID STENT;  Surgeon: Oneal Grout, MD;  Location: [facility] OR;  Service: Radiology;  Laterality: N/A;  . TEE WITHOUT CARDIOVERSION N/A 10/18/2012   Procedure: TRANSESOPHAGEAL ECHOCARDIOGRAM (TEE);  Surgeon: Vesta Mixer, MD;  Location: [name];  Service: Cardiovascular;  Laterality: N/A;    FAMILY HISTORY: History reviewed. No pertinent family history.  SOCIAL HISTORY: Social History   Socioeconomic History  . Marital status: Married    Spouse name: Perlie Gold  . Number of children: 1  . Years of education: Masters  . Highest education level: Not on file  Occupational History  . Occupation: disability    Employer: [employer]   Social Needs  . Financial resource strain: Not on file  . Food insecurity:  Worry: Not on file    Inability: Not on file  . Transportation needs:    Medical: Not on file    Non-medical: Not on file  Tobacco Use  . Smoking status: Former Smoker    Packs/day: 2.00    Years: 25.00    Pack years: 50.00    Types: Cigarettes    Last attempt to quit: 10/19/2012    Years since quitting: 5.1  . Smokeless tobacco: Never Used  Substance and Sexual Activity  . Alcohol use: No  . Drug use: No  . Sexual activity: Never  Lifestyle  . Physical activity:    Days per week: Not on file    Minutes per session: Not on file  . Stress: Not on file  Relationships  . Social connections:    Talks on phone: Not on file    Gets together: Not on file    Attends religious service: Not on file    Active member of club or organization: Not on file    Attends meetings of clubs or organizations: Not on file    Relationship status: Not on file  . Intimate partner violence:    Fear of current or ex partner: Not on file    Emotionally abused: Not on file    Physically abused: Not on file    Forced sexual activity: Not on file  Other Topics Concern  . Not on file  Social  History Narrative   Pt lives at home with husband Perlie Gold)   Pt is right handed   Education Masters degree   Caffeine 2 cups daily     PHYSICAL EXAM  Vitals:   12/01/17 1514  BP: 123/74  Pulse: 78  Weight: 165 lb (74.8 kg)  Height: 5\' 6"  (1.676 m)   Body mass index is 26.63 kg/m. General: well developed, well nourished, seated, in no evident distress  Head: head normocephalic and atraumatic.  Neck: supple with no carotid  bruits  Cardiovascular: regular rate and rhythm, no murmurs  Musculoskeletal: no deformity   Neurologic Exam  Mental Status: Awake and fully alert. Oriented to place and time. Recent and remote memory intact. Attention span, concentration and fund of knowledge appropriate. Mood labile. Occasional word finding difficulties.  Cranial Nerves: Pupils equal, briskly reactive to light. Extraocular movements full without nystagmus. Visual fields full to confrontation. Hearing intact. Mild right face asymmetry. Facial sensation intact. Face, tongue, palate moves normally and symmetrically.  Motor: Normal bulk and tone on left. Mild right hemiparesis 4/5 with spasticity and increase tone on right shoulder. Diminished fine finger movements on right. Mild action tremors upper extremities right greater  Sensory: intact to touch and pinprick and vibratory in the upper and lower extremities.  Coordination: Rapid alternating movements decreased on right side. Finger-to-nose and heel-to-shin performed accurately bilaterally.  Gait and Station: Arises from chair without difficulty. Stance is wide based  Gait demonstrates spastic hemiplegic gait with stiffness of right leg. Romberg negative. Reflexes: 2+ and asymmetric and brisker on right. Toes downgoing.   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 Right ICA greater than 80 % RICA stenosis s/p PTA/stenting by Dr Tiffany [name] on 11/09/12. Residual right sided hemiparesis, mild expressive aphasia, and post-stroke depression, emotional lability. 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 The patient is a current patient of Dr.Sethi  who is out of the office today . This note is sent to the work in doctor.     PLAN:Stressed the importance of management of risk factors to prevent further stroke Continue Aggrenox for secondary stroke prevention Maintain strict control of hypertension with blood pressure goal below 130/90, today's reading 123/74 Control of diabetes with hemoglobin A1c below 6.5 followed by primary care  Cholesterol with LDL cholesterol less than 70, followed by primary care,  continue statin drug Lipitor Exercise by walking, use walker at all times at risk for falls eat healthy diet with whole grains,  fresh fruits and vegetables Follow-up with primary care for stroke risk factor modification, maintain blood pressure goal less than 130 systolic, diabetes with A1c below 7, lipids with LDL below 70 No stroke symptoms since last visit 05/17/16  Last carotid doppler 02/07/17 Technically limited study due to thick neck and depth of vessels. Appears to be 50-75% Right ICA stent stenosis. Known occlusion of the Left ICA. Bilateral antegrade vertebral flow.Will need to follow with vascular surgery referral.  Discharge from [facility] For invasive procedures must be of Aggrenox during that time there is increased risk of stroke Nilda Riggs, [facility], [facility], APRN  [facility] [address], [address] [place], [place] [address] [phone]

## 2017-12-01 ENCOUNTER — Encounter: Payer: Self-pay | Admitting: Nurse Practitioner

## 2017-12-01 ENCOUNTER — Telehealth: Payer: Self-pay | Admitting: *Deleted

## 2017-12-01 ENCOUNTER — Ambulatory Visit: Payer: Medicare Other | Admitting: Nurse Practitioner

## 2017-12-01 VITALS — BP 123/74 | HR 78 | Ht 66.0 in | Wt 165.0 lb

## 2017-12-01 DIAGNOSIS — I6529 Occlusion and stenosis of unspecified carotid artery: Secondary | ICD-10-CM | POA: Diagnosis not present

## 2017-12-01 DIAGNOSIS — E785 Hyperlipidemia, unspecified: Secondary | ICD-10-CM | POA: Diagnosis not present

## 2017-12-01 DIAGNOSIS — Z8673 Personal history of transient ischemic attack (TIA), and cerebral infarction without residual deficits: Secondary | ICD-10-CM

## 2017-12-01 DIAGNOSIS — I6521 Occlusion and stenosis of right carotid artery: Secondary | ICD-10-CM

## 2017-12-01 NOTE — Patient Instructions (Addendum)
Stressed the importance of management of risk factors to prevent further stroke Continue Aggrenox for secondary stroke prevention Maintain strict control of hypertension with blood pressure goal below 130/90, today's reading 123/74 Control of diabetes with hemoglobin A1c below 6.5 followed by primary care  Cholesterol with LDL cholesterol less than 70, followed by primary care,  continue statin drug Lipitor Exercise by walking, use walker at all times at risk for falls eat healthy diet with whole grains,  fresh fruits and vegetables Follow-up with primary care for stroke risk factor modification, maintain blood pressure goal less than 130 systolic, diabetes with A1c below 7, lipids with LDL below 70 No stroke symptoms since last visit 05/17/16  Discharge from [facility] For invasive procedures must be of Aggrenox during that time there is increased risk of stroke   Stroke Prevention Some health problems and behaviors may make it more likely for you to have a stroke. Below are ways to lessen your risk of having a stroke.  Be active for at least 30 minutes on most or all days.  Do not smoke. Try not to be around others who smoke.  Do not drink too much alcohol. ? Do not have more than 2 drinks a day if you are a man. ? Do not have more than 1 drink a day if you are a woman and are not pregnant.  Eat healthy foods, such as fruits and vegetables. If you were put on a specific diet, follow the diet as told.  Keep your cholesterol levels under control through diet and medicines. Look for foods that are low in saturated fat, trans fat, cholesterol, and are high in fiber.  If you have diabetes, follow all diet plans and take your medicine as told.  Ask your doctor if you need treatment to lower your blood pressure. If you have high blood pressure (hypertension), follow all diet plans and take your medicine as told by your doctor.  If you are 68-31 years old, have your blood pressure checked  every 3-5 years. If you are age 96 or older, have your blood pressure checked every year.  Keep a healthy weight. Eat foods that are low in calories, salt, saturated fat, trans fat, and cholesterol.  Do not take drugs.  Avoid birth control pills, if this applies. Talk to your doctor about the risks of taking birth control pills.  Talk to your doctor if you have sleep problems (sleep apnea).  Take all medicine as told by your doctor. ? You may be told to take aspirin or blood thinner medicine. Take this medicine as told by your doctor. ? Understand your medicine instructions.  Make sure any other conditions you have are being taken care of.  Get help right away if:  You suddenly lose feeling (you feel numb) or have weakness in your face, arm, or leg.  Your face or eyelid hangs down to one side.  You suddenly feel confused.  You have trouble talking (aphasia) or understanding what people are saying.  You suddenly have trouble seeing in one or both eyes.  You suddenly have trouble walking.  You are dizzy.  You lose your balance or your movements are clumsy (uncoordinated).  You suddenly have a very bad headache and you do not know the cause.  You have new chest pain.  Your heart feels like it is fluttering or skipping a beat (irregular heartbeat). Do not wait to see if the symptoms above go away. Get help right away. Call  your local emergency services (911 in [place] Do not drive yourself to the hospital. This information is not intended to replace advice given to you by your health care provider. Make sure you discuss any questions you have with your health care provider. Document Released: 12/07/2011 Document Revised: 11/13/2015 Document Reviewed: 12/08/2012 Elsevier Interactive Patient Education  [employer]2018 Elsevier Inc.

## 2017-12-01 NOTE — Progress Notes (Signed)
Personally  participated in, made any corrections needed, and agree with history, physical, neuro exam,assessment and plan as stated above.    Antonia Ahern, MD [facility] 

## 2017-12-01 NOTE — Telephone Encounter (Signed)
I LMVM for pt with family member at home to call back tomorrow. Regarding referral for carotid doppler (to vascular surgeon local in [place] ? or here in [place]?).  Per CM/NP note, need to be followed by vascular surgeon.  Last doppler 01/2017.

## 2017-12-02 NOTE — Telephone Encounter (Signed)
Pt returning call stating she would [facility] [place] [facility] [place] [facility]

## 2017-12-05 NOTE — Telephone Encounter (Signed)
No vascular surgeons in [place] . Patient would like a nurse to call her back patient does not understand why she needs to see a vascular surgeon.

## 2017-12-06 NOTE — Telephone Encounter (Signed)
Spoke to pt.  She will be having epidural steroid injection in her back 12-12-17, by Dr. Retia PasseSaullo.  (faxed notes to [phone] per her request w/ fax confirmation received).  She was asking about vascular surgeon referral.  I relayed that per CM/NP note that she needed to be followed by vascular surgeon relating to her doppler results 01-2017 (last study).  She has been discharged from stroke clinic.  Pt [place] as no one in [place].  [facility] is option.

## 2017-12-06 NOTE — Addendum Note (Signed)
Addended by: Guy BeginYOUNG, Apoorva Bugay S on: 12/06/2017 07:39 AM   Modules accepted: Orders

## 2017-12-07 ENCOUNTER — Other Ambulatory Visit: Payer: Self-pay

## 2017-12-07 DIAGNOSIS — I6523 Occlusion and stenosis of bilateral carotid arteries: Secondary | ICD-10-CM

## 2017-12-07 NOTE — Telephone Encounter (Signed)
Noted Referral has sent.

## 2018-01-31 ENCOUNTER — Encounter: Payer: Self-pay | Admitting: Vascular Surgery

## 2018-01-31 ENCOUNTER — Ambulatory Visit: Payer: Medicare Other | Admitting: Vascular Surgery

## 2018-01-31 ENCOUNTER — Ambulatory Visit (HOSPITAL_COMMUNITY)
Admission: RE | Admit: 2018-01-31 | Discharge: 2018-01-31 | Disposition: A | Discharge status: Home / Self Care | Payer: Medicare Other | Source: Ambulatory Visit | Attending: Vascular Surgery | Admitting: Vascular Surgery

## 2018-01-31 ENCOUNTER — Other Ambulatory Visit: Payer: Self-pay

## 2018-01-31 VITALS — BP 98/66 | HR 71 | Resp 18 | Ht 66.0 in | Wt 165.0 lb

## 2018-01-31 DIAGNOSIS — Z9889 Other specified postprocedural states: Secondary | ICD-10-CM

## 2018-01-31 DIAGNOSIS — I6523 Occlusion and stenosis of bilateral carotid arteries: Secondary | ICD-10-CM | POA: Diagnosis present

## 2018-01-31 DIAGNOSIS — Z95828 Presence of other vascular implants and grafts: Secondary | ICD-10-CM

## 2018-01-31 NOTE — Progress Notes (Signed)
Vascular and Vein Specialist of [facility]  Patient name: Tiffany [name]. Tiffany [name] MRN: [id number] DOB: 23-Mar-1951 Sex: female  REASON FOR CONSULT: Follow-up right carotid stent  HPI: Tiffany [name] is a 67 y.o. female, who is seen today for initiation of follow-up of stenting right carotid artery.  She is here today with her daughter.  She is a very pleasant 76 year old who suffered a major left brain stroke in 2014.  She had paralysis of her right side and also a aphasia.  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  She has had no new neurologic deficits since that time.  She continues to have major disability related to her old stroke.  She is in a wheelchair today but reports she can walk around her home with a walker.  Her last carotid duplex a year ago showed moderate in-stent restenosis on the right.  She has never had right hemispheric symptoms  Past Medical History:  Diagnosis Date  . Arthritis   . Carotid artery occlusion   . Coronary artery disease   . High cholesterol   . Hypertension   . MI (myocardial infarction) (HCC)   . Peripheral vascular disease (HCC)   . Stroke (HCC)   . TIA (transient ischemic attack)     Family History  Problem Relation Age of Onset  . Heart disease Father     SOCIAL HISTORY: Social History   Socioeconomic History  . Marital status: Married    Spouse name: Perlie Gold  . Number of children: 1  . Years of education: Masters  . Highest education level: Not on file  Occupational History  . Occupation: disability    Employer: [employer]   Social Needs  . Financial resource strain: Not on file  . Food insecurity:    Worry: Not on file    Inability: Not on file  . Transportation needs:    Medical: Not on file    Non-medical:  Not on file  Tobacco Use  . Smoking status: Former Smoker    Packs/day: 2.00    Years: 25.00    Pack years: 50.00    Types: Cigarettes    Last attempt to quit: 10/19/2012    Years since quitting: 5.2  . Smokeless tobacco: Never Used  Substance and Sexual Activity  . Alcohol use: No  . Drug use: No  . Sexual activity: Never  Lifestyle  . Physical activity:    Days per week: Not on file    Minutes per session: Not on file  . Stress: Not on file  Relationships  . Social connections:    Talks on phone: Not on file    Gets together: Not on file    Attends religious service: Not on file    Active member of club or organization: Not on file    Attends meetings of clubs or organizations: Not on file    Relationship status: Not on file  . Intimate partner violence:    Fear of current or ex partner: Not on file    Emotionally abused: Not on file    Physically abused: Not on file    Forced sexual activity: Not on file  Other Topics Concern  . Not on file  Social History Narrative   Pt lives at home with husband Perlie Gold)   Pt is right  handed   Education Masters degree   Caffeine 2 cups daily    Allergies  Allergen Reactions  . Macrobid [Nitrofurantoin Monohyd Macro] Swelling    Angioedema!!  . Other Swelling    Angioedema!! Angioedema!!  . Benadryl [Diphenhydramine Hcl (Sleep)] Other (See Comments)    Bugs crawling sensation  . Codeine Nausea Only  . Diphenhydramine     Bugs crawling sensation  . Nitrofurantoin     unknown    Current Outpatient Medications  Medication Sig Dispense Refill  . atorvastatin (LIPITOR) 80 MG tablet Take 80 mg by mouth at bedtime.     . baclofen (LIORESAL) 10 MG tablet Take 1 tablet (10 mg total) by mouth 2 (two) times daily. 60 each 2  . buPROPion (WELLBUTRIN XL) 300 MG 24 hr tablet Take 300 mg by mouth at bedtime.     . citalopram (CELEXA) 40 MG tablet Take 40 mg by mouth every morning.     . clonazePAM (KLONOPIN) 0.5 MG tablet Take 0.5  mg by mouth 2 (two) times daily as needed for anxiety.     . dipyridamole-aspirin (AGGRENOX) 200-25 MG 12hr capsule Take 1 capsule by mouth 2 (two) times daily.    Marland Kitchen. gabapentin (NEURONTIN) 300 MG capsule Take 1 capsule (300 mg total) by mouth 4 (four) times daily. (Patient taking differently: Take 300 mg by mouth 3 (three) times daily. ) 120 capsule 5  . nabumetone (RELAFEN) 750 MG tablet Take 750 mg by mouth daily.     Marland Kitchen. acetaminophen (TYLENOL) 325 MG tablet Take 325-650 mg by mouth every 6 (six) hours as needed (for headaches).    . mometasone (NASONEX) 50 MCG/ACT nasal spray Place 2 sprays into the nose daily. (Patient not taking: Reported on 01/31/2018) 17 g 5   No current facility-administered medications for this visit.    Facility-Administered Medications Ordered in Other Visits  Medication Dose Route Frequency Provider Last Rate Last Dose  . fentaNYL (SUBLIMAZE) injection 25-50 mcg  25-50 mcg Intravenous Q5 min PRN Bedelia PersonKasik, Lee, MD        REVIEW OF SYSTEMS:  [X]  denotes positive finding, [ ]  denotes negative finding Cardiac  Comments:  Chest pain or chest pressure:    Shortness of breath upon exertion:    Short of breath when lying flat:    Irregular heart rhythm:        Vascular    Pain in calf, thigh, or hip brought on by ambulation:    Pain in feet at night that wakes you up from your sleep:     Blood clot in your veins:    Leg swelling:         Pulmonary    Oxygen at home:    Productive cough:     Wheezing:         Neurologic    Sudden weakness in arms or legs:  x   Sudden numbness in arms or legs:  x   Sudden onset of difficulty speaking or slurred speech: x   Temporary loss of vision in one eye:     Problems with dizziness:         Gastrointestinal    Blood in stool:     Vomited blood:         Genitourinary    Burning when urinating:     Blood in urine:        Psychiatric    Major depression:         Hematologic  Bleeding problems:    Problems with  blood clotting too easily:        Skin    Rashes or ulcers:        Constitutional    Fever or chills:      PHYSICAL EXAM: Vitals:   01/31/18 1428 01/31/18 1430  BP: (!) 98/57 98/66  Pulse: 71   Resp: 18   SpO2: 90%   Weight: 165 lb (74.8 kg)   Height: 5\' 6"  (1.676 m)     GENERAL: The patient is a well-nourished female, in no acute distress. The vital signs are documented above. CARDIOVASCULAR: Carotid arteries without bruits bilaterally.  2+ radial pulses bilaterally PULMONARY: There is good air exchange  ABDOMEN: Soft and non-tender  MUSCULOSKELETAL: There are no major deformities or cyanosis. NEUROLOGIC: The reduced motor function in her right arm and leg.  Ease is able to grip with her right hand SKIN: There are no ulcers or rashes noted. PSYCHIATRIC: The patient has a normal affect.  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  I discussed symptoms of carotid disease with patient and her daughter and will notify us immediately should this occur.  Otherwise have recommended yearly duplex follow-up to monitor her carotid stent.   Larina Earthlyodd F. Namish Krise, MD FACS [facility] Tel [phone] Pager [phone]

## 2019-05-03 IMAGING — CR DG HIP (WITH OR WITHOUT PELVIS) 2V BILAT
5 series · 5 of 5 positions shown · non-contrast
Comparison: None.

CLINICAL DATA: Right hip pain, possible recent fall, initial
encounter

EXAM:
DG HIP (WITH OR WITHOUT PELVIS) 4V BILAT

[pelvis ap]
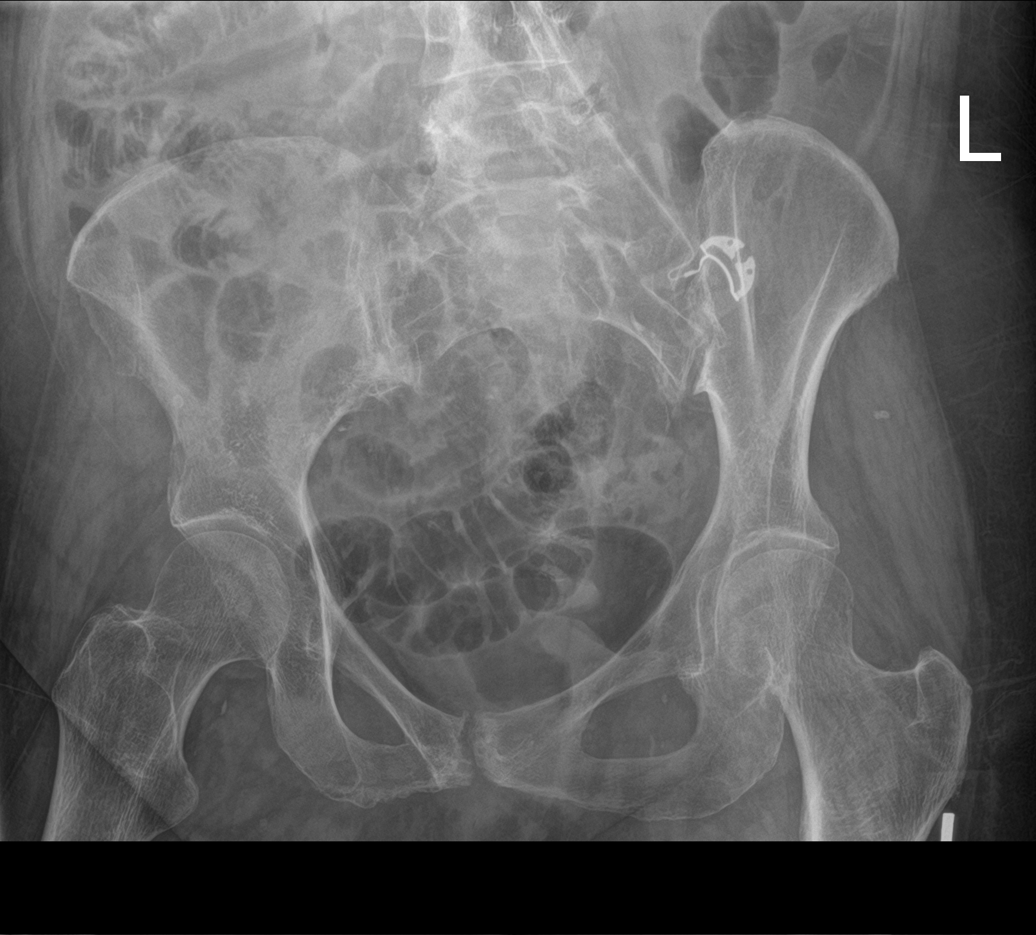

[hip ap (1 of 2)]
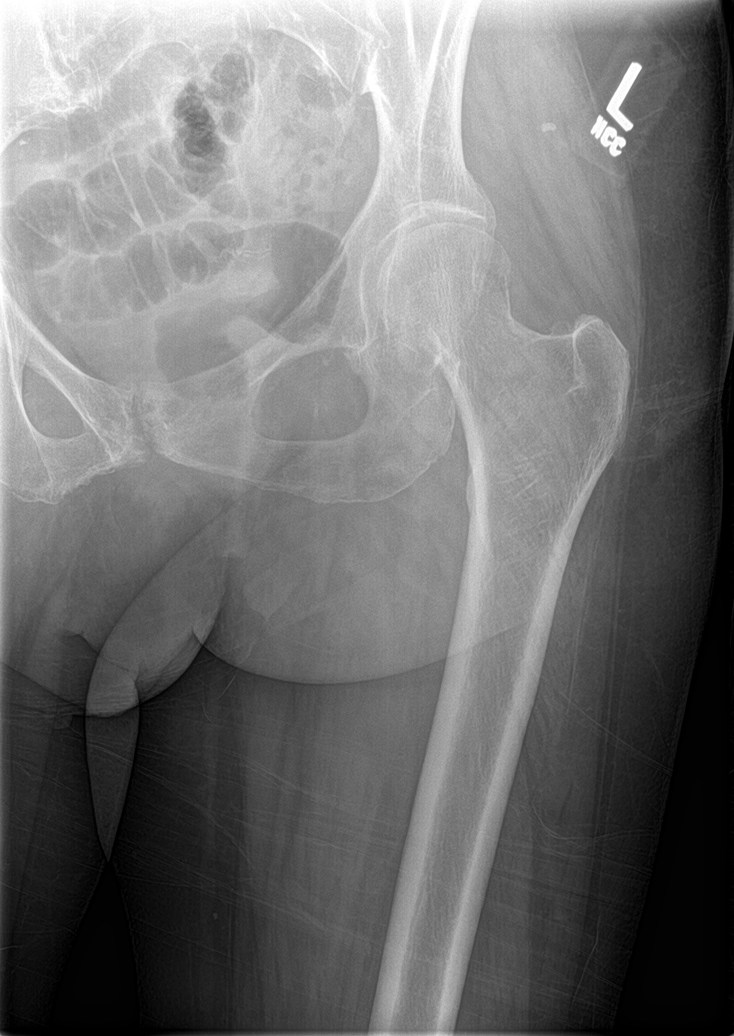

[hip lat (1 of 2)]
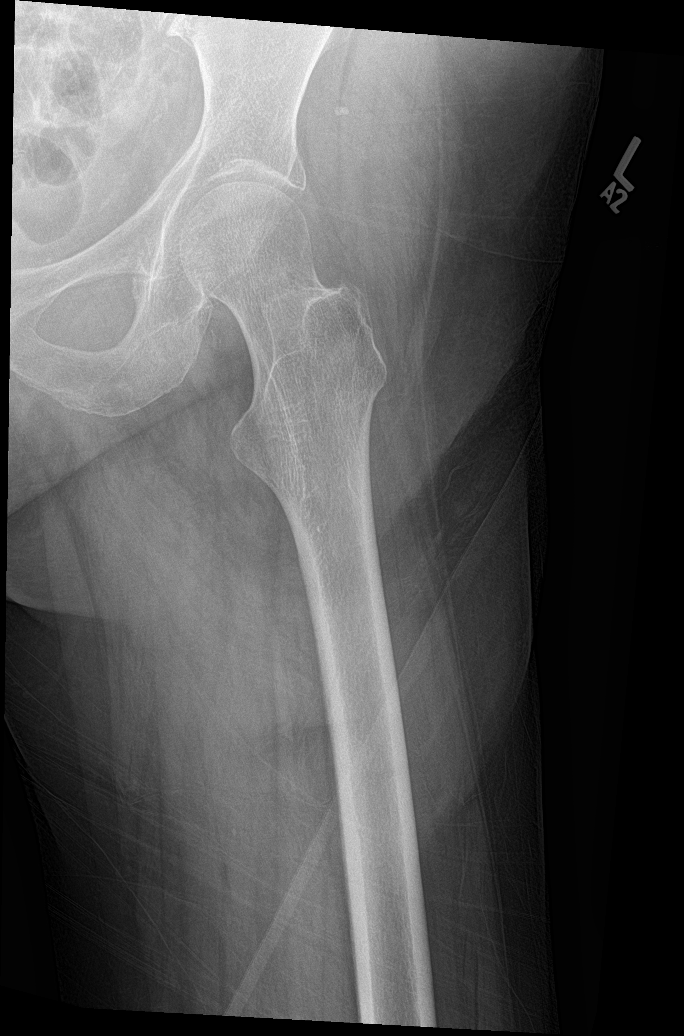

[hip ap (2 of 2)]
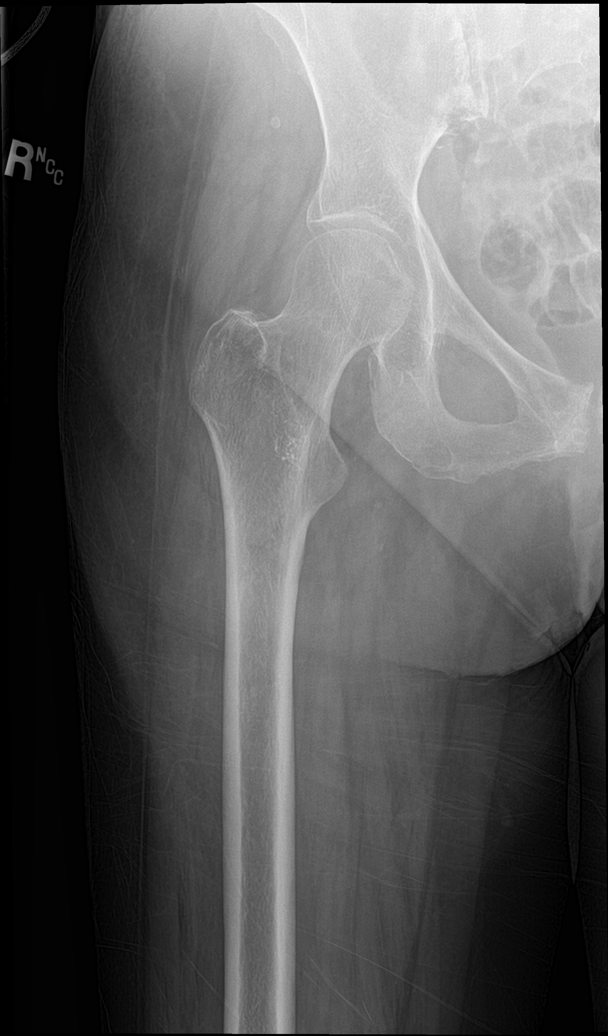

[hip lat (2 of 2)]
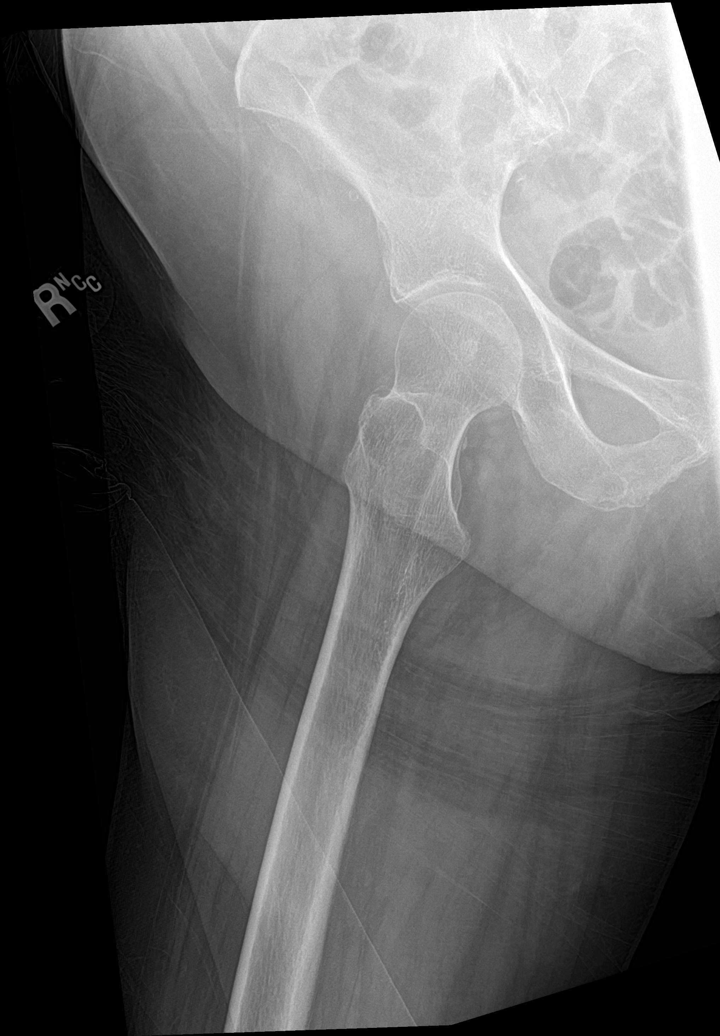

[5 of 5 positions shown; findings below may reference images not displayed]

FINDINGS: The pelvic ring is intact. No acute fracture or dislocation is seen.
No gross soft tissue abnormality is noted.
IMPRESSION: No acute fracture is noted.

## 2019-05-03 IMAGING — CT CT HEAD W/O CM
4 series · 15 of 47 positions shown, 17 images · non-contrast
Comparison: Head CT 10/17/2012, brain MRI 10/17/2012

ADDENDUM:
These results were called by telephone at the time of interpretation
on 02/05/2017 at [DATE] to Dr. HEBERT FABIAN ZITTO , who verbally
acknowledged these results.
CLINICAL DATA: Unexplained altered level of consciousness.

EXAM:
CT HEAD WITHOUT CONTRAST
TECHNIQUE: Contiguous axial images were obtained from the base of the skull
through the vertex without intravenous contrast.

[Series 3: head wo · axial · 0.48mm/px · z∈[-155,-35]mm · 7 of 33 slices shown, 9 images]
[im 5/33  brain]
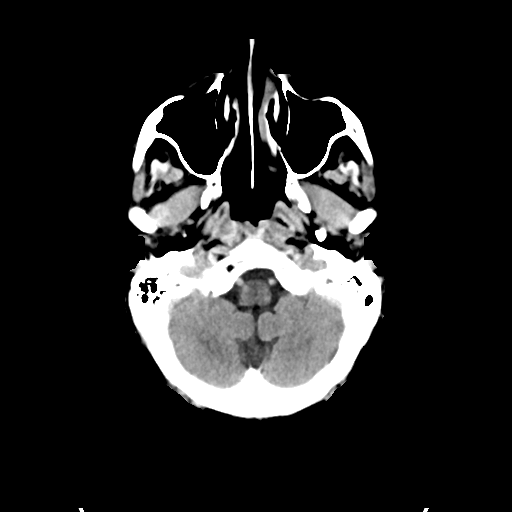
[im 5/33  bone]
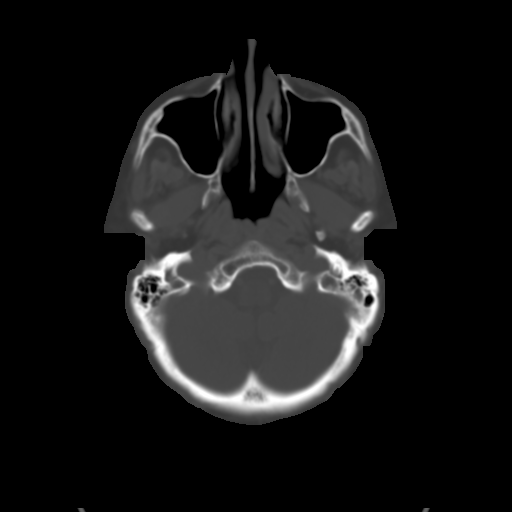
[im 9/33  brain]
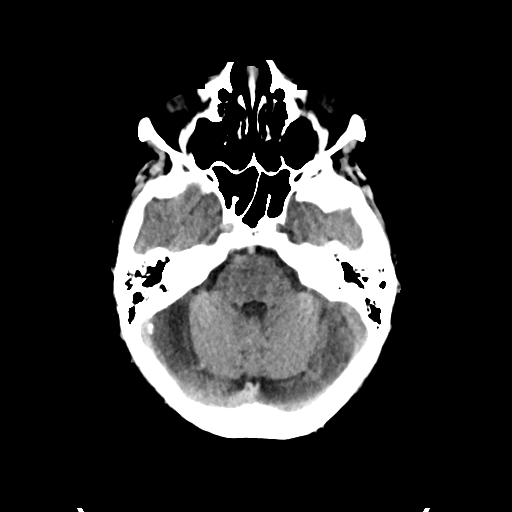
[im 13/33  brain]
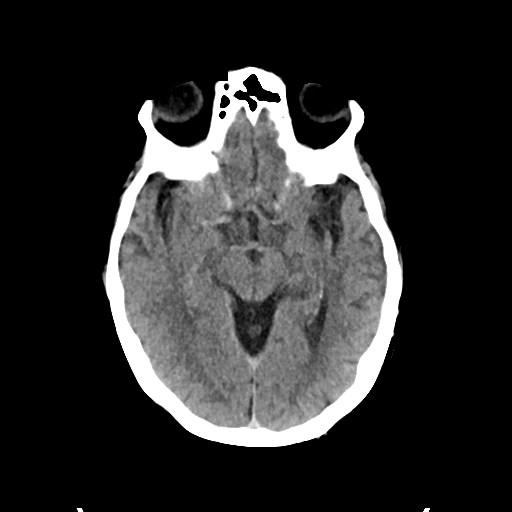
[im 17/33  brain]
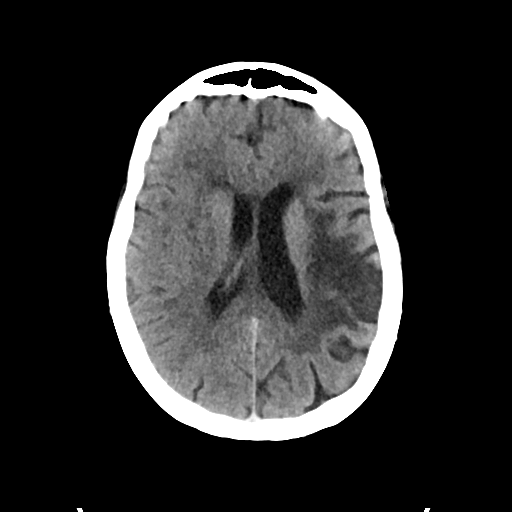
[im 21/33  brain]
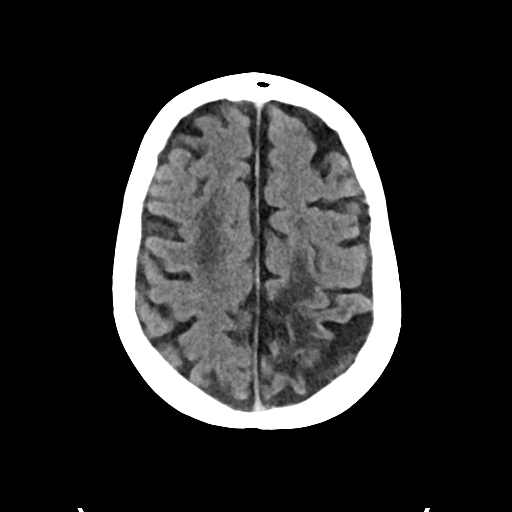
[im 21/33  bone]
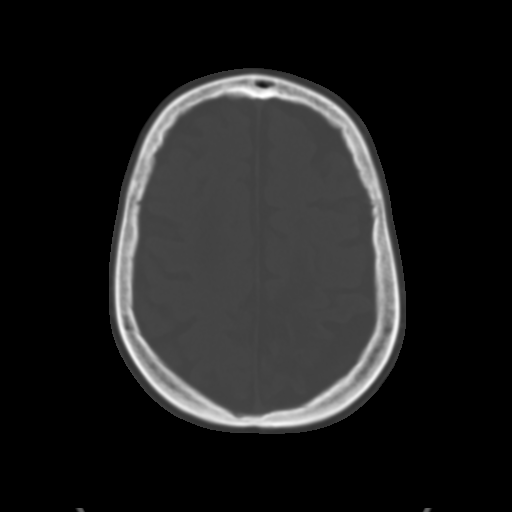
[im 25/33  brain]
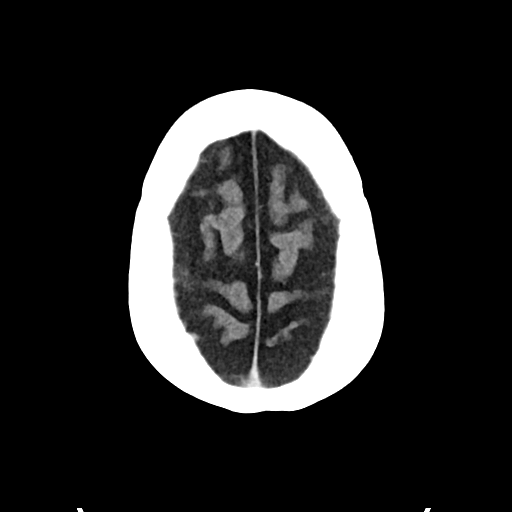
[im 29/33  brain]
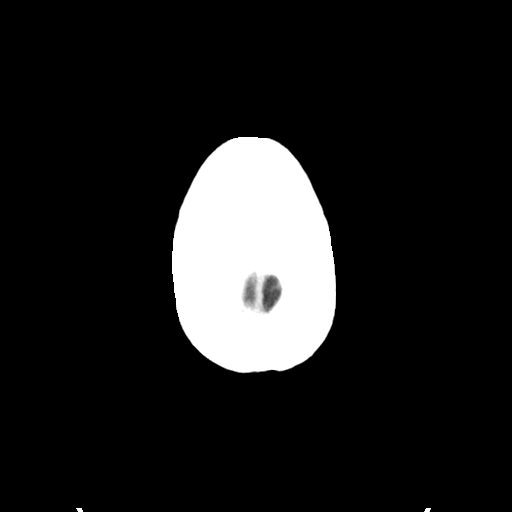

[Series 4: head bone · axial · 0.48mm/px · z∈[-159,-143]mm · 2 of 81 slices shown]
[im 9/81  bone]
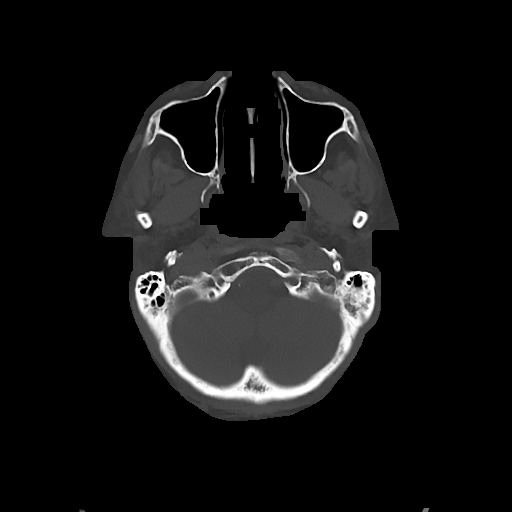
[im 17/81  bone]
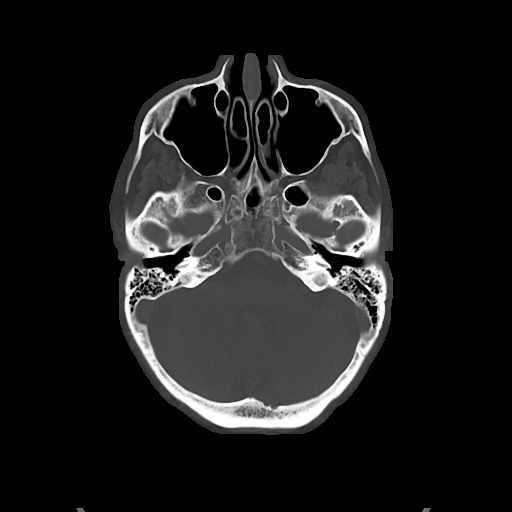

[Series 5: cor soft · coronal · 0.31mm/px · 3 of 74 slices shown]
[im 25/74  brain]
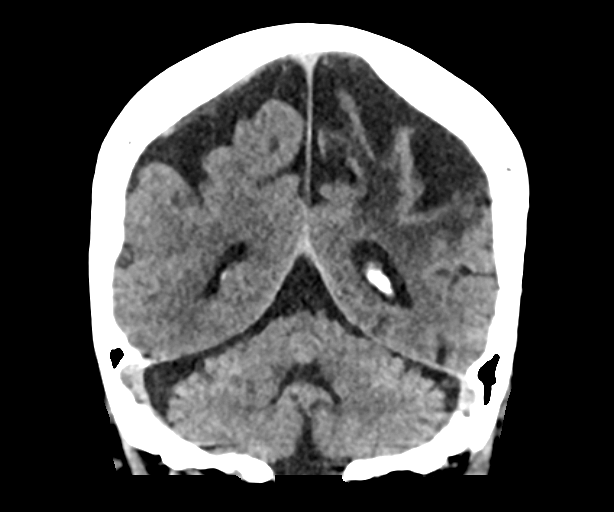
[im 33/74  brain]
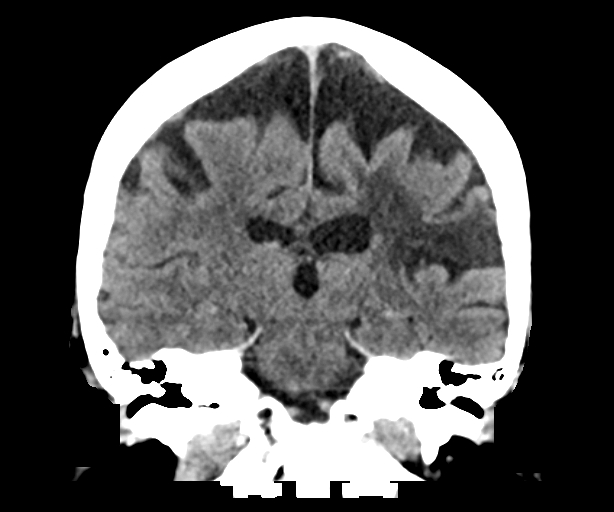
[im 41/74  brain]
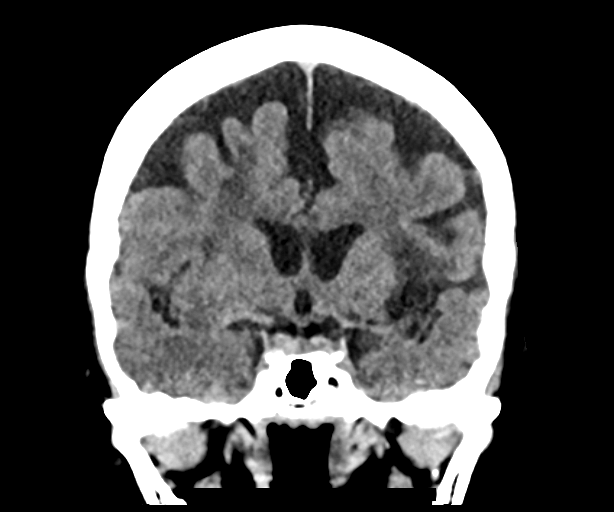

[Series 6: sag soft · sagittal · 0.31mm/px · 3 of 55 slices shown]
[im 19/55  brain]
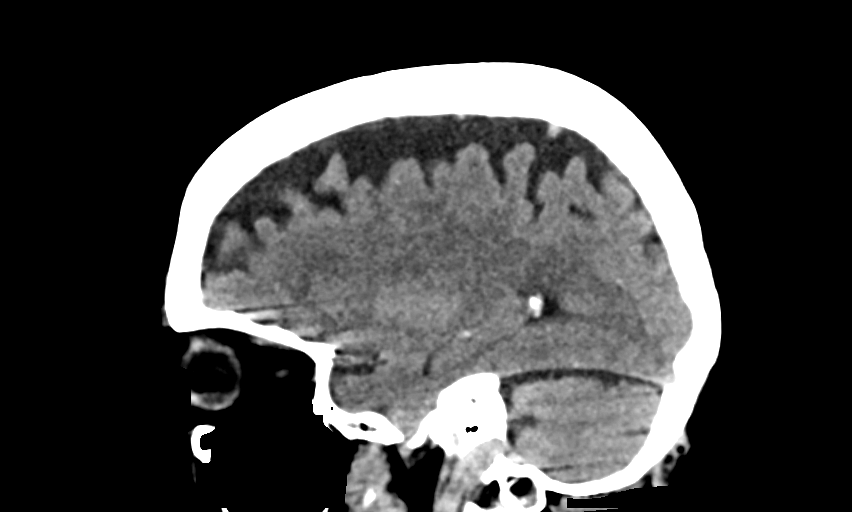
[im 28/55  brain]
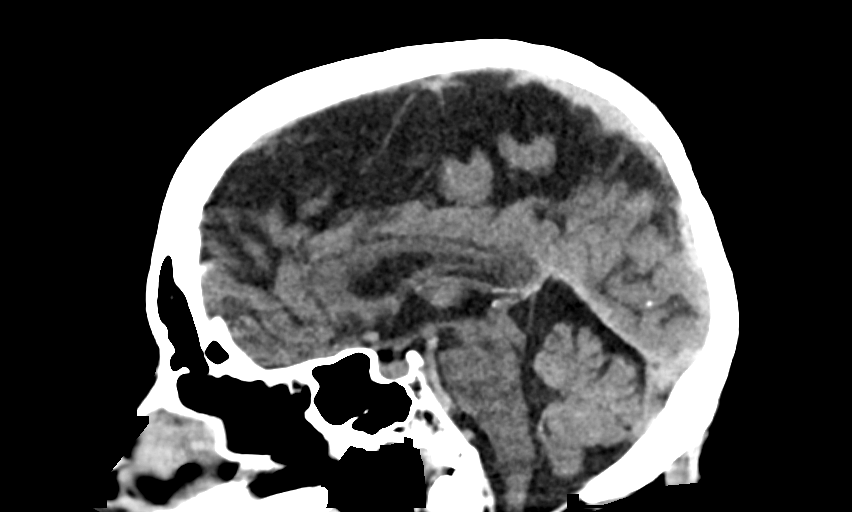
[im 37/55  brain]
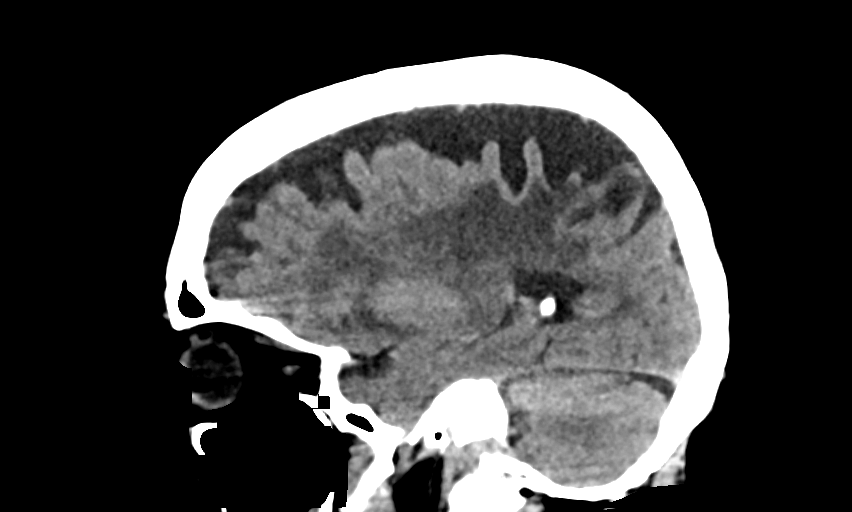

[15 of 47 positions shown; findings below may reference images not displayed]

FINDINGS: Brain: No evidence of acute hemorrhage, hydrocephalus, extra-axial
collection or mass lesion/mass effect. Hypoattenuated defect in the
left MCA territory appears to involve larger area of the parietal
cortex when compared to prior MRI. Periventricular microangiopathy
and moderate brain parenchymal volume loss.

Vascular: Calcific atherosclerotic disease at the skullbase.

Skull: Normal. Negative for fracture or focal lesion.

Sinuses/Orbits: No acute finding.

Other: None.
IMPRESSION: Hypoattenuated defect in the left MCA territory appears to involve
larger area of the parietal cortex when compared to the prior MRI
from 5036. Therefore, reinfarction in the same vascular category
cannot be excluded.

Periventricular microangiopathy and moderate brain parenchymal
volume loss.

## 2019-05-03 IMAGING — CT CT ABD-PELV W/ CM
2 of 5 series · 16 of 46 positions shown, 18 images · IV contrast (Omni 300)
Comparison: None.

CLINICAL DATA: Nausea vomiting diarrhea and fever for 2 days.

EXAM:
CT ABDOMEN AND PELVIS WITH CONTRAST
TECHNIQUE: Multidetector CT imaging of the abdomen and pelvis was performed
using the standard protocol following bolus administration of
intravenous contrast.
CONTRAST:  100mL JIP590-6CC IOPAMIDOL (JIP590-6CC) INJECTION 61%

[Series 3: a/p w/ 5mm · axial · 0.70mm/px · z∈[+657,+1062]mm · 13 of 91 slices shown, 15 images]
[im 5/91  soft-tissue]
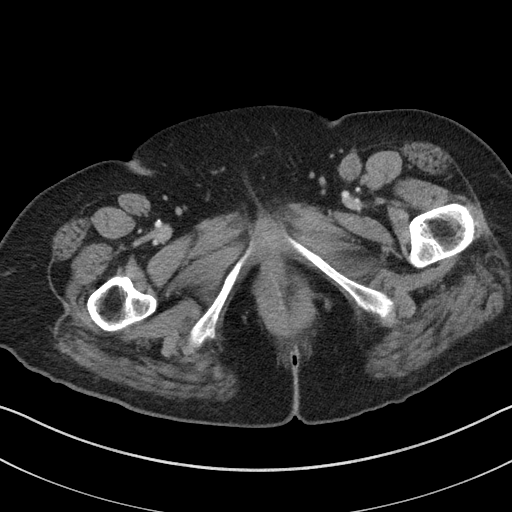
[im 5/91  bone]
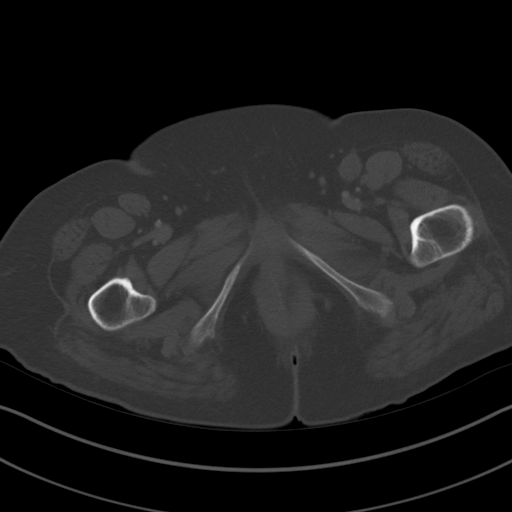
[im 14/91  soft-tissue]
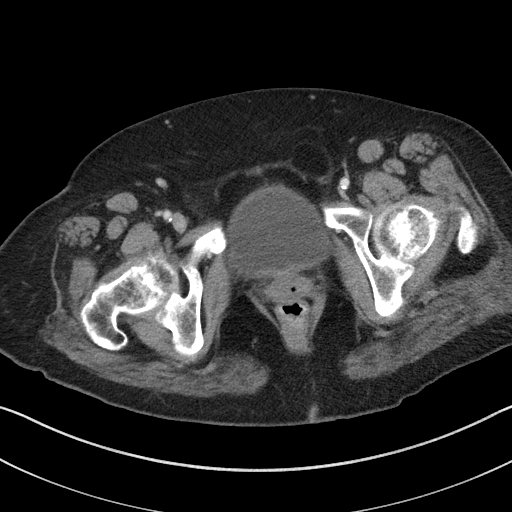
[im 19/91  soft-tissue]
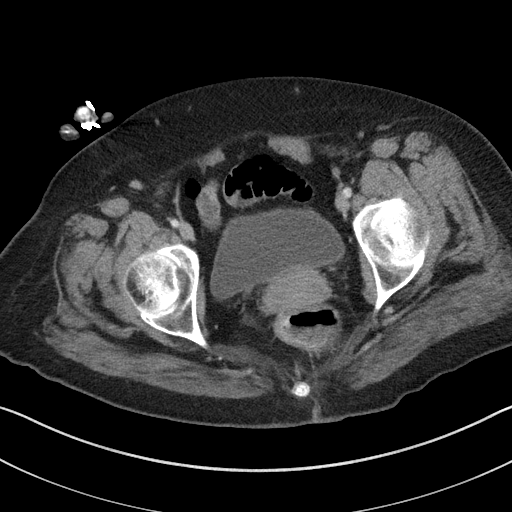
[im 28/91  soft-tissue]
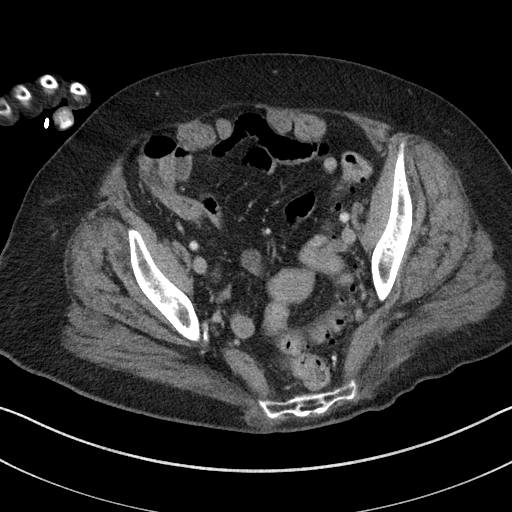
[im 32/91  soft-tissue]
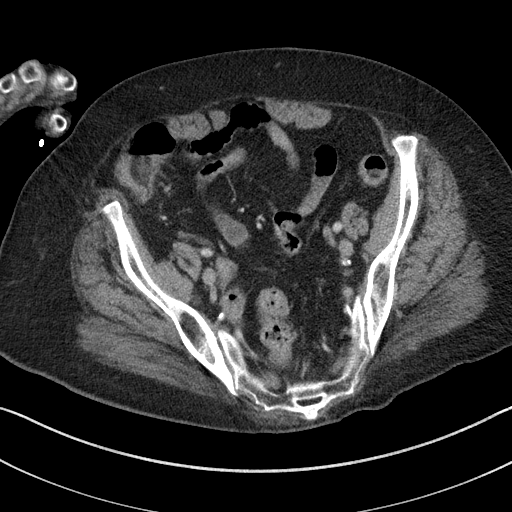
[im 41/91  soft-tissue]
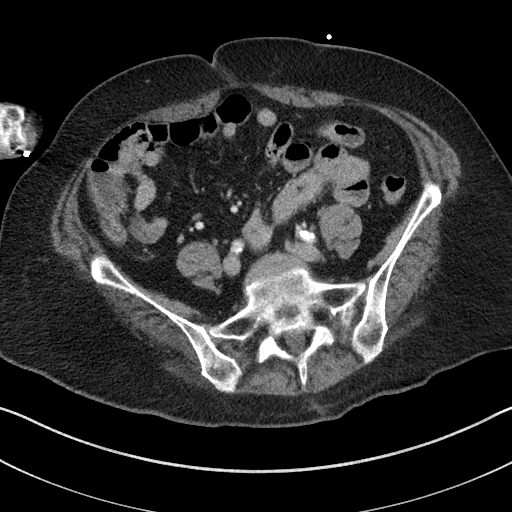
[im 46/91  soft-tissue]
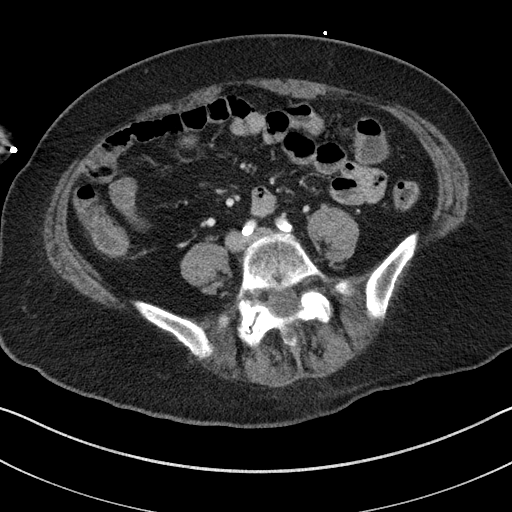
[im 50/91  soft-tissue]
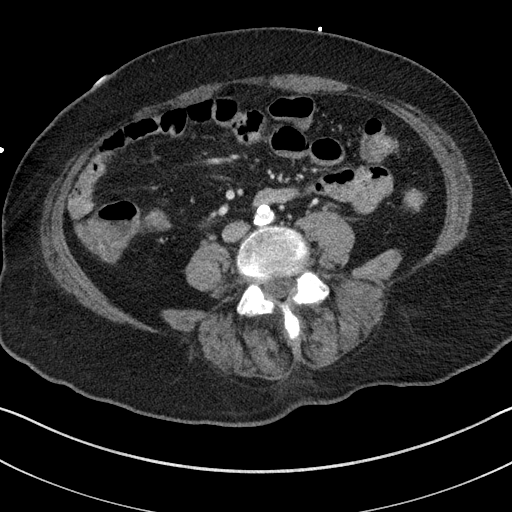
[im 59/91  soft-tissue]
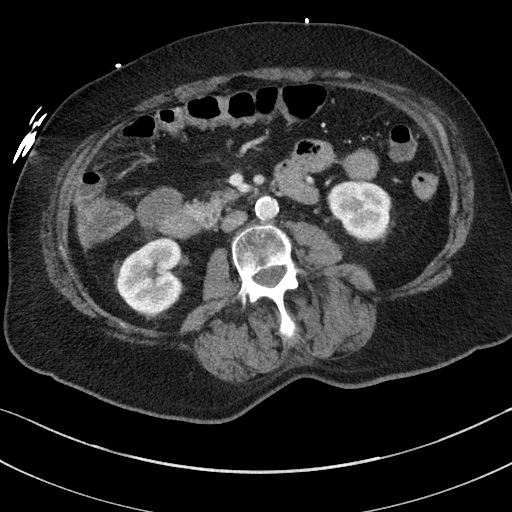
[im 59/91  bone]
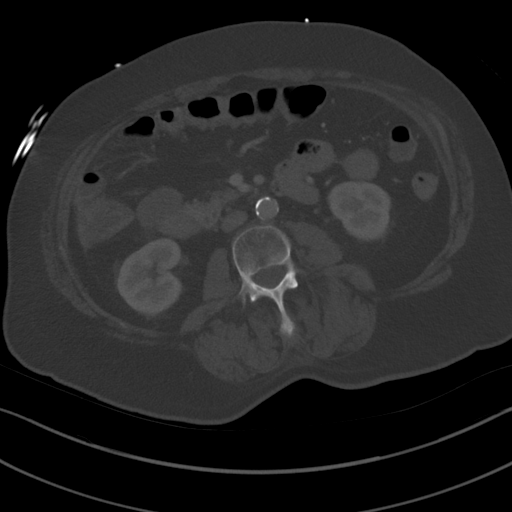
[im 64/91  soft-tissue]
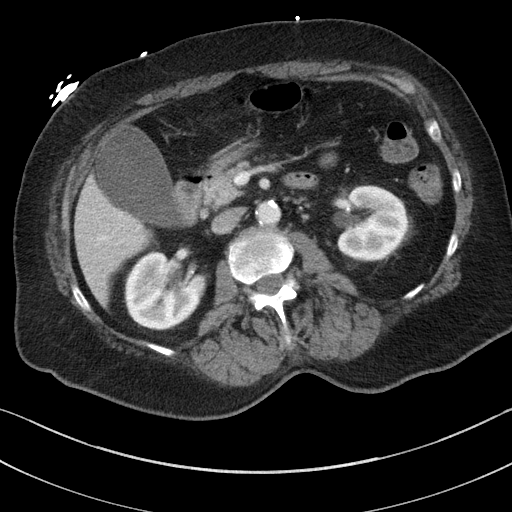
[im 73/91  soft-tissue]
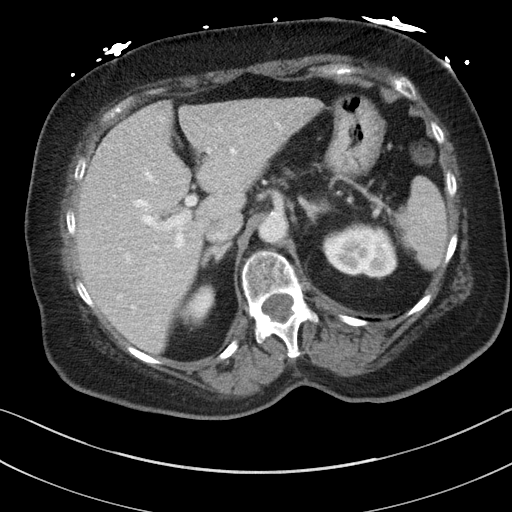
[im 77/91  soft-tissue]
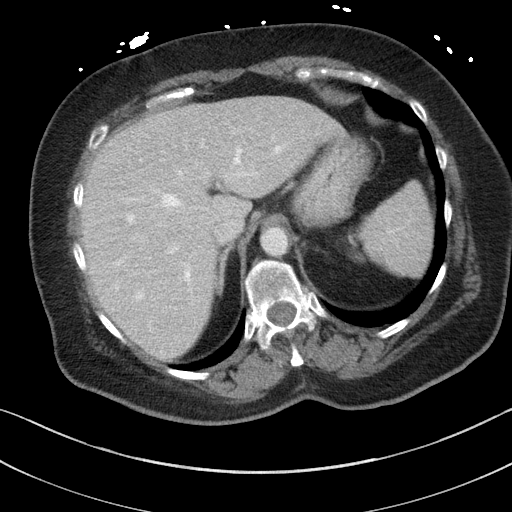
[im 86/91  soft-tissue]
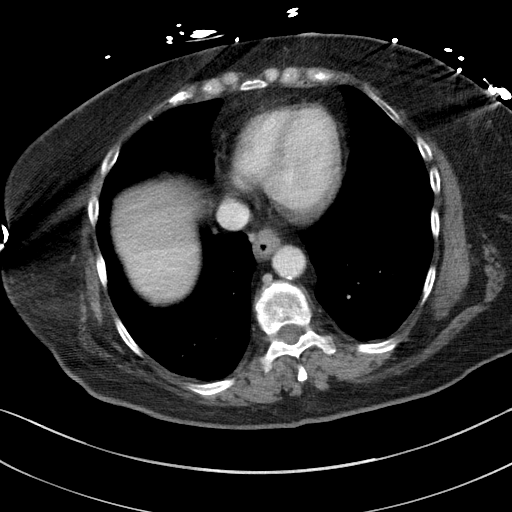

[Series 6: a/p w/ cor · coronal · 0.74mm/px · 3 of 145 slices shown]
[im 49/145  soft-tissue]
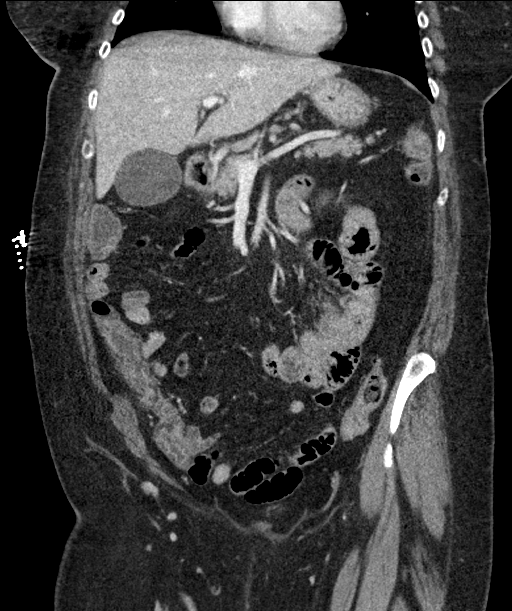
[im 65/145  soft-tissue]
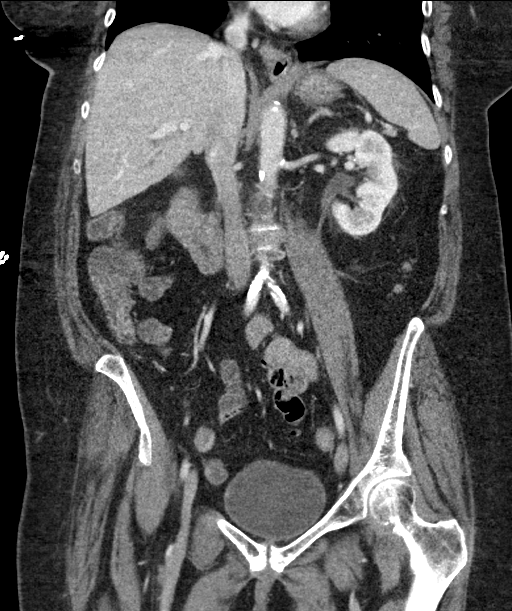
[im 81/145  soft-tissue]
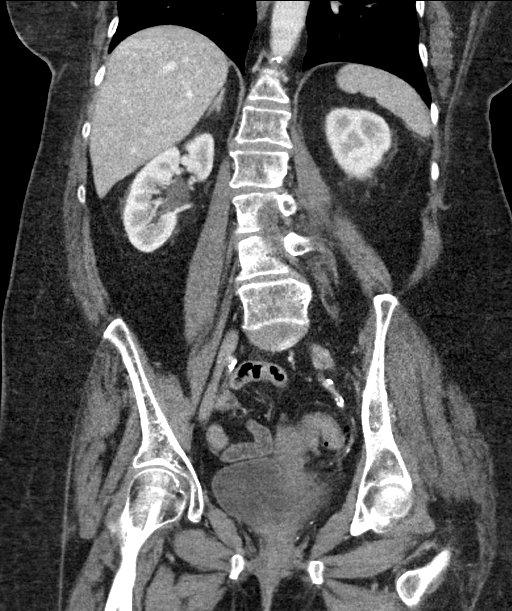

[16 of 46 positions shown; findings below may reference images not displayed]

FINDINGS: Lower chest: No acute abnormality.

Hepatobiliary: No focal liver abnormality is seen. No gallstones,
gallbladder wall thickening, or biliary dilatation.

Pancreas: Unremarkable. No pancreatic ductal dilatation or
surrounding inflammatory changes.

Spleen: Normal in size without focal abnormality.

Adrenals/Urinary Tract: Adrenal glands are unremarkable. Kidneys are
without renal calculi, focal lesion, or hydronephrosis. Bilateral
perirenal fat stranding, nonspecific. Bladder is unremarkable.

Stomach/Bowel: Stomach is within normal limits. Tiny hiatal hernia.
Appendix appears normal. No evidence of bowel wall thickening,
distention, or inflammatory changes. 3.4 cm low-attenuation
elongated structure abuts or arises from the second portion of the
duodenum, axial image 33/91, coronal image 57/151. Scattered left
colonic diverticulosis without evidence of diverticulitis.

Vascular/Lymphatic: Aortic atherosclerosis. No enlarged abdominal or
pelvic lymph nodes.

Reproductive: Uterus and bilateral adnexa are unremarkable.

Other: No abdominal wall hernia or abnormality. No abdominopelvic
ascites.

Musculoskeletal: No acute or significant osseous findings.
IMPRESSION: Nonspecific perirenal fat stranding.

3.4 cm low-attenuation elongated structure abutting or arising from
the second portion of the duodenum. This may represent a duodenal
diverticulum, however it is suboptimally evaluated due to lack of
oral contrast. Further evaluation with upper GI endoscopy may be
considered if found clinically necessary. Otherwise, CT of the
abdomen with IV and oral contrast in 3 months may be considered.

Scattered colonic diverticulosis without evidence of diverticulitis.

## 2019-07-23 ENCOUNTER — Other Ambulatory Visit: Payer: Self-pay

## 2019-07-23 ENCOUNTER — Encounter: Payer: Self-pay | Admitting: Orthopedic Surgery

## 2019-07-23 ENCOUNTER — Ambulatory Visit: Payer: Medicare PPO | Admitting: Orthopedic Surgery

## 2019-07-23 ENCOUNTER — Ambulatory Visit (INDEPENDENT_AMBULATORY_CARE_PROVIDER_SITE_OTHER): Payer: Medicare PPO

## 2019-07-23 VITALS — Ht 66.0 in | Wt 165.0 lb

## 2019-07-23 DIAGNOSIS — M79671 Pain in right foot: Secondary | ICD-10-CM

## 2019-07-23 DIAGNOSIS — B351 Tinea unguium: Secondary | ICD-10-CM | POA: Diagnosis not present

## 2019-07-23 NOTE — Progress Notes (Signed)
Office Visit Note   Patient: Tiffany [name]. Vickki Muff           Date of Birth: [birth year]           MRN: [id number] Visit Date: 07/23/2019              Requested by: Raina Mina., MD [place] [facility] [place],  [place] [address] PCP: Raina Mina., MD  Chief Complaint  Patient presents with  . Right Foot - Pain      HPI: Patient is a 69 year old woman who presents complaining of extension of the right great toe, clawing of the second toe.  Patient states she is status post myocardial infarction and does have cardiac stents.  Patient states she is also had 2 left-sided strokes affecting the right lower extremity and right upper extremity and she states she has a carotid stent.    Assessment & Plan: Visit Diagnoses:  1. Pain in right foot   2. Onychomycosis     Plan: Recommend patient get some silicone spacers to place between the toes that do not rub on each other continue with her sandals we will reevaluate in 3 months for possible nail care.  Follow-Up Instructions: Return in about 3 months (around 10/20/2019).   Ortho Exam  Patient is alert, oriented, no adenopathy, well-dressed, normal affect, normal respiratory effort. Examination patient does not have a palpable dorsalis pedis or posterior tibial pulse she has extension of the great toe at the MTP joint she does not have active flexion of the toe most likely due to her previous strokes.  She has fixed clawing of the second toe there is no ulcerations in the plantar dorsum of the foot there are no venous stasis ulcers.  Patient has thickened discolored onychomycotic nails x10 she is unable to safely trim the nails on her own and nails were trimmed I50 without complications.  Imaging: XR Foot 2 Views Right  Result Date: 07/23/2019 2 view radiographs of the right foot shows decreased bone mineral density with clawing of the toes no fractures.  No images are attached to the encounter.  Labs: Lab Results  Component Value Date     HGBA1C 5.8 (H) 10/17/2012   REPTSTATUS 02/10/2017 FINAL 02/05/2017   CULT NO GROWTH 5 DAYS 02/05/2017     Lab Results  Component Value Date   ALBUMIN 3.1 (L) 02/05/2017   ALBUMIN 3.2 (L) 11/09/2012   ALBUMIN 3.2 (L) 10/23/2012    No results found for: MG No results found for: VD25OH  No results found for: PREALBUMIN CBC EXTENDED Latest Ref Rng & Units 02/07/2017 02/06/2017 02/05/2017  WBC 4.0 - 10.5 K/uL 10.9(H) 13.9(H) 13.9(H)  RBC 3.87 - 5.11 MIL/uL 3.78(L) 3.89 4.47  HGB 12.0 - 15.0 g/dL 11.7(L) 11.8(L) 13.8  HCT 36.0 - 46.0 % 35.2(L) 36.1 42.3  PLT 150 - 400 K/uL 192 170 180  NEUTROABS 1.7 - 7.7 K/uL - - 11.8(H)  LYMPHSABS 0.7 - 4.0 K/uL - - 1.0     Body mass index is 26.63 kg/m.  Orders:  Orders Placed This Encounter  Procedures  . XR Foot 2 Views Right   No orders of the defined types were placed in this encounter.    Procedures: No procedures performed  Clinical Data: No additional findings.  ROS:  All other systems negative, except as noted in the HPI. Review of Systems  Objective: Vital Signs: Ht 5\' 6"  (1.676 m)   Wt 165 lb (74.8 kg)   BMI  26.63 kg/m   Specialty Comments:  No specialty comments available.  PMFS History: Patient Active Problem List   Diagnosis Date Noted  . History of stroke 12/01/2017  . Carotid stenosis, asymptomatic 12/01/2017  . Acute pyelonephritis   . Nausea vomiting and diarrhea 02/06/2017  . Anxiety and depression 02/06/2017  . Urinary tract infection with hematuria   . Sepsis (HCC) 02/05/2017  . History of CVA with residual deficit 05/17/2016  . Abnormality of gait 05/17/2016  . 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 Spastic hemiplegia affecting dominant side (HCC) 12/01/2012  . Dysphasia, late effect of cerebrovascular disease 12/01/2012  . Carotid stenosis, symptomatic, with infarction (HCC) 11/10/2012  . Muscle spasms of lower extremity 11/09/2012  . Cerebral infarction (HCC) 10/23/2012  .  Depression 10/23/2012  . Dyslipidemia 10/23/2012   Past Medical History:  Diagnosis Date  . Arthritis   . Carotid artery occlusion   . Coronary artery disease   . High cholesterol   . Hypertension   . MI (myocardial infarction) (HCC)   . Peripheral vascular disease (HCC)   . Stroke (HCC)   . TIA (transient ischemic attack)     Family History  Problem Relation Age of Onset  . Heart disease Father     Past Surgical History:  Procedure Laterality Date  . CARDIAC SURGERY    . CORONARY ARTERY BYPASS GRAFT    . RADIOLOGY WITH ANESTHESIA N/A 11/09/2012   Procedure: RADIOLOGY WITH ANESTHESIA- CAROTID STENT;  Surgeon: Oneal Grout, MD;  Location: [facility] OR;  Service: Radiology;  Laterality: N/A;  . TEE WITHOUT CARDIOVERSION N/A 10/18/2012   Procedure: TRANSESOPHAGEAL ECHOCARDIOGRAM (TEE);  Surgeon: Vesta Mixer, MD;  Location[facility] ENDOSCOPY;  Service: Cardiovascular;  Laterality: N/A;   Social History   Occupational History  . Occupation: disability    Employer: [employer]   Tobacco Use  . Smoking status: Former Smoker    Packs/day: 2.00    Years: 25.00    Pack years: 50.00    Types: Cigarettes    Quit date: 10/19/2012    Years since quitting: 6.7  . Smokeless tobacco: Never Used  Substance and Sexual Activity  . Alcohol use: No  . Drug use: No  . Sexual activity: Never

## 2019-10-22 ENCOUNTER — Ambulatory Visit: Payer: Medicare PPO | Admitting: Orthopedic Surgery

## 2020-07-11 ENCOUNTER — Ambulatory Visit: Payer: Medicare PPO | Admitting: Gastroenterology

## 2020-07-21 ENCOUNTER — Ambulatory Visit: Payer: Medicare PPO | Admitting: Gastroenterology

## 2022-04-29 ENCOUNTER — Encounter: Payer: Self-pay | Admitting: Physician Assistant

## 2022-06-03 ENCOUNTER — Ambulatory Visit: Payer: Medicare PPO | Admitting: Physician Assistant

## 2022-07-12 ENCOUNTER — Encounter: Payer: Self-pay | Admitting: Physician Assistant

## 2022-07-13 ENCOUNTER — Ambulatory Visit: Payer: Medicare PPO | Admitting: Physician Assistant

## 2022-07-13 ENCOUNTER — Encounter: Payer: Self-pay | Admitting: Physician Assistant

## 2022-07-13 VITALS — BP 110/66 | HR 80 | Ht 66.0 in | Wt 135.5 lb

## 2022-07-13 DIAGNOSIS — R1084 Generalized abdominal pain: Secondary | ICD-10-CM

## 2022-07-13 DIAGNOSIS — K5909 Other constipation: Secondary | ICD-10-CM

## 2022-07-13 MED ORDER — POLYETHYLENE GLYCOL 3350 17 GM/SCOOP PO POWD: 1.0000 | Freq: Once | ORAL | 0 refills | Status: AC

## 2022-07-13 NOTE — Progress Notes (Signed)
Chief Complaint: Constipation  HPI:    Tiffany [name] is a 72 year old female with a past medical history of anxiety, CAD, previous MI and stroke on Aggrenox, who was referred to me by Raina Mina., MD for a complaint of constpiation.      Today, the patient presents to clinic accompanied by her daughter.  They explain that the patient has always had chronic constipation, but over the past year or so this seems to have gotten worse.  Patient is on chronic Hydrocodone ever since her stroke in 2014 for various aches and pains and recently this year a Fentanyl patch was also added.  Patient describes that typically she will have a few days of no bowel movement and then will have diarrhea and then go back to a few days of no bowel movement.  If she feels constipated which means she has some abdominal pain then she will go ahead and take a MiraLAX dose in addition to the Colace tab that she takes daily.  Also sometimes adds additional stool softeners on those days.  When she has a bowel movement the abdominal pain goes away.  Associated symptoms included decreased appetite and some weight loss.    Her daughter describes a history of a negative Cologuard within the past couple of years and previous colonoscopies.         Retired Marine scientist.    Denies fever, chills, blood in her stool, nausea or vomiting.  Past Medical History:  Diagnosis Date   Anxiety    Arthritis    Carotid artery occlusion    Chronic pain    Chronic UTI    Coronary artery disease    DDD (degenerative disc disease), lumbar    Dysmetabolic syndrome    High cholesterol    Hypertension    MI (myocardial infarction) ([place])    Peripheral vascular disease (HCC)    Pseudobulbar affect    RLS (restless legs syndrome)    Stroke (HCC)    TIA (transient ischemic attack)     Past Surgical History:  Procedure Laterality Date   CARDIAC SURGERY     CORONARY ARTERY BYPASS GRAFT     EYE SURGERY     PTCA  01/27/1995   stent placement s/p  ptca   RADIOLOGY WITH ANESTHESIA N/A 11/09/2012   Procedure: RADIOLOGY WITH ANESTHESIA- CAROTID STENT;  Surgeon: Rob Hickman, MD;  Location: [facility];  Service: Radiology;  Laterality: N/A;   TEE WITHOUT CARDIOVERSION N/A 10/18/2012   Procedure: TRANSESOPHAGEAL ECHOCARDIOGRAM (TEE);  Surgeon: Thayer Headings, MD;  Location: [facility];  Service: Cardiovascular;  Laterality: N/A;    Current Outpatient Medications  Medication Sig Dispense Refill   acetaminophen (TYLENOL) 325 MG tablet Take 325-650 mg by mouth every 6 (six) hours as needed (for headaches).     atorvastatin (LIPITOR) 80 MG tablet Take 80 mg by mouth at bedtime.      baclofen (LIORESAL) 10 MG tablet Take 1 tablet (10 mg total) by mouth 2 (two) times daily. 60 each 2   buPROPion (WELLBUTRIN XL) 300 MG 24 hr tablet Take 300 mg by mouth at bedtime.      citalopram (CELEXA) 40 MG tablet Take 40 mg by mouth every morning.      clonazePAM (KLONOPIN) 0.5 MG tablet Take 0.5 mg by mouth 2 (two) times daily as needed for anxiety.      dipyridamole-aspirin (AGGRENOX) 200-25 MG 12hr capsule Take 1 capsule by mouth 2 (two) times daily.  gabapentin (NEURONTIN) 300 MG capsule Take 1 capsule (300 mg total) by mouth 4 (four) times daily. (Patient taking differently: Take 300 mg by mouth 3 (three) times daily. ) 120 capsule 5   mometasone (NASONEX) 50 MCG/ACT nasal spray Place 2 sprays into the nose daily. 17 g 5   nabumetone (RELAFEN) 750 MG tablet Take 750 mg by mouth daily.      No current facility-administered medications for this visit.   Facility-Administered Medications Ordered in Other Visits  Medication Dose Route Frequency Provider Last Rate Last Admin   fentaNYL (SUBLIMAZE) injection 25-50 mcg  25-50 mcg Intravenous Q5 min PRN Leda Quail, MD        Allergies as of 07/13/2022 - Review Complete 07/23/2019  Allergen Reaction Noted   Macrobid [nitrofurantoin monohyd macro] Swelling 11/12/2015   Other Swelling 11/12/2015    Benadryl [diphenhydramine hcl (sleep)] Other (See Comments) 05/20/2014   Codeine Nausea Only 10/19/2012   Diphenhydramine  05/20/2014   Nitrofurantoin  05/24/2016    Family History  Problem Relation Age of Onset   Heart disease Mother    Hypertension Mother    Thyroid disease Mother    Stroke Mother    Heart disease Father    Early death Father    Hypertension Father     Social History   Socioeconomic History   Marital status: Married    Spouse name: Joneen Caraway   Number of children: 1   Years of education: Masters   Highest education level: Not on file  Occupational History   Occupation: disability    Employer: [employer]   Tobacco Use   Smoking status: Former    Packs/day: 2.00    Years: 25.00    Total pack years: 50.00    Types: Cigarettes    Quit date: 10/19/2012    Years since quitting: 9.7   Smokeless tobacco: Never  Substance and Sexual Activity   Alcohol use: No   Drug use: No   Sexual activity: Never  Other Topics Concern   Not on file  Social History Narrative   Pt lives at home with husband Joneen Caraway)   Pt is right handed   Education Masters degree   Caffeine 2 cups daily   Social Determinants of Radio broadcast assistant Strain: Not on file  Food Insecurity: Not on file  Transportation Needs: Not on file  Physical Activity: Not on file  Stress: Not on file  Social Connections: Not on file  Intimate Partner Violence: Not on file    Review of Systems:    Constitutional: No fever or chills Skin: No rash Cardiovascular: No chest pain  Respiratory: No SOB  Gastrointestinal: See HPI and otherwise negative Genitourinary: No dysuria  Neurological: No headache, dizziness or syncope Musculoskeletal: No new muscle or joint pain Hematologic: No bleeding  Psychiatric: No history of depression or anxiety   Physical Exam:  Vital signs: BP 110/66   Pulse 80   Ht 5\' 6"  (1.676 m)   Wt 135 lb 8 oz (61.5 kg)   BMI 21.87 kg/m     Constitutional:   Pleasant Elderly Caucasian female appears to be in NAD, Well developed, Well nourished, alert and cooperative Head:  Normocephalic and atraumatic. Eyes:   PEERL, EOMI. No icterus. Conjunctiva pink. Ears:  Normal auditory acuity. Neck:  Supple Throat: Oral cavity and pharynx without inflammation, swelling or lesion.  Respiratory: Respirations even and unlabored. Lungs clear to auscultation bilaterally.   No wheezes, crackles, or rhonchi.  Cardiovascular: Normal S1, S2. No MRG. Regular rate and rhythm. No peripheral edema, cyanosis or pallor.  Gastrointestinal:  Soft, nondistended, nontender. No rebound or guarding. Normal bowel sounds. No appreciable masses or hepatomegaly. Rectal:  Not performed.  Msk:  Symmetrical without gross deformities. Without edema, no deformity or joint abnormality. +wheelchair bound Neurologic:  Alert and  oriented x4;  grossly normal neurologically.  Skin:   Dry and intact without significant lesions or rashes. Psychiatric:  Demonstrates good judgement and reason without abnormal affect or behaviors.  No recent labs available.  Assessment: 1.  Constipation: Chronic for the patient, worsened over the past year on multiple pain medications, though does find relief with MiraLAX every few days and stool softeners; likely opioid induced 2.  Abdominal pain: With the above  Plan: 1.  Encouraged the patient to use MiraLAX daily.  We discussed titration of this up to 4 times a day if needed.  Hopefully with taking something every day she will avoid the abdominal pain and days of constipation. 2.  Patient will call and let us know how the above is going, if it does not work anymore we may need to try prescription medication dedicated to relieving constipation from her opioids. 3.  Also discussed that likely the diarrhea she is experiencing is overflow constipation and not true diarrhea.  Explained this to her and her daughter. 4.  Patient assigned to  Dr. Adela Lank this afternoon.  She will follow in clinic as needed.  Hyacinth Meeker, PA-C [facility] 07/13/2022, 2:16 PM  Cc: Gordan Payment., MD

## 2022-07-13 NOTE — Patient Instructions (Signed)
We have sent the following medications to your pharmacy for you to pick up at your convenience:  Start Miralax daily 1 scoop in [place] of water  _______________________________________________________  If your blood pressure at your visit was 140/90 or greater, please contact your primary care physician to follow up on this.  _______________________________________________________  If you are age 73 or older, your body mass index should be between 23-30. Your Body mass index is 21.87 kg/m. If this is out of the aforementioned range listed, please consider follow up with your Primary Care Provider.  If you are age 31 or younger, your body mass index should be between 19-25. Your Body mass index is 21.87 kg/m. If this is out of the aformentioned range listed, please consider follow up with your Primary Care Provider.   ________________________________________________________  The [facility] providers would like to encourage you to use [facility] to communicate with providers for non-urgent requests or questions.  Due to long hold times on the telephone, sending your provider a message by [facility] may be a faster and more efficient way to get a response.  Please allow 48 business hours for a response.  Please remember that this is for non-urgent requests.   Thank you for entrusting me with your care and choosing [facility].  Ellouise Newer PA-C

## 2022-07-14 NOTE — Progress Notes (Signed)
Agree with assessment plan as outlined.  It would be good to know dates of her Cologuard that were done in the past, and results of prior colonoscopies to clarify if she would need an exam in the future or if she would want to pursue that.  Not sure if patient knows where those were done and we have ability to get those results?
